# Patient Record
Sex: Female | Born: 1955 | Race: White | Hispanic: No | Smoking: Never smoker
Health system: Southern US, Community
[De-identification: ages and names within clinical notes are randomized; demographics above are authoritative.]

## PROBLEM LIST (undated history)

## (undated) DIAGNOSIS — K5790 Diverticulosis of intestine, part unspecified, without perforation or abscess without bleeding: Secondary | ICD-10-CM

## (undated) DIAGNOSIS — E854 Organ-limited amyloidosis: Secondary | ICD-10-CM

## (undated) DIAGNOSIS — E119 Type 2 diabetes mellitus without complications: Secondary | ICD-10-CM

## (undated) DIAGNOSIS — I10 Essential (primary) hypertension: Secondary | ICD-10-CM

## (undated) DIAGNOSIS — E8581 Light chain (AL) amyloidosis: Secondary | ICD-10-CM

## (undated) DIAGNOSIS — I7300 Raynaud's syndrome without gangrene: Secondary | ICD-10-CM

## (undated) DIAGNOSIS — E0590 Thyrotoxicosis, unspecified without thyrotoxic crisis or storm: Secondary | ICD-10-CM

## (undated) DIAGNOSIS — G629 Polyneuropathy, unspecified: Secondary | ICD-10-CM

## (undated) DIAGNOSIS — R6889 Other general symptoms and signs: Secondary | ICD-10-CM

## (undated) HISTORY — DX: Diverticulosis of intestine, part unspecified, without perforation or abscess without bleeding: K57.90

## (undated) HISTORY — PX: BACK SURGERY: SHX140

## (undated) HISTORY — PX: MANDIBLE SURGERY: SHX707

## (undated) HISTORY — DX: Polyneuropathy, unspecified: G62.9

## (undated) HISTORY — DX: Other general symptoms and signs: R68.89

## (undated) HISTORY — DX: Light chain (AL) amyloidosis: E85.81

## (undated) HISTORY — PX: BREAST SURGERY: SHX581

## (undated) HISTORY — PX: OTHER SURGICAL HISTORY: SHX169

## (undated) HISTORY — DX: Raynaud's syndrome without gangrene: I73.00

## (undated) HISTORY — DX: Thyrotoxicosis, unspecified without thyrotoxic crisis or storm: E05.90

---

## 2004-04-28 ENCOUNTER — Other Ambulatory Visit: Admission: RE | Admit: 2004-04-28 | Discharge: 2004-04-28 | Payer: Self-pay | Admitting: Obstetrics and Gynecology

## 2005-05-22 ENCOUNTER — Ambulatory Visit: Payer: Self-pay | Admitting: Cardiology

## 2005-06-25 ENCOUNTER — Ambulatory Visit: Payer: Self-pay | Admitting: Cardiology

## 2017-02-11 DIAGNOSIS — E1165 Type 2 diabetes mellitus with hyperglycemia: Secondary | ICD-10-CM | POA: Insufficient documentation

## 2021-05-01 ENCOUNTER — Encounter (HOSPITAL_COMMUNITY): Payer: Self-pay | Admitting: Emergency Medicine

## 2021-05-01 ENCOUNTER — Emergency Department (HOSPITAL_COMMUNITY): Payer: Medicare HMO

## 2021-05-01 ENCOUNTER — Other Ambulatory Visit: Payer: Self-pay

## 2021-05-01 ENCOUNTER — Inpatient Hospital Stay (HOSPITAL_COMMUNITY)
Admission: EM | Admit: 2021-05-01 | Discharge: 2021-05-12 | DRG: 074 | Disposition: A | Discharge status: Skilled Nursing Facility | Payer: Medicare HMO | Attending: Internal Medicine | Admitting: Internal Medicine

## 2021-05-01 DIAGNOSIS — E8581 Light chain (AL) amyloidosis: Secondary | ICD-10-CM | POA: Diagnosis present

## 2021-05-01 DIAGNOSIS — R0902 Hypoxemia: Secondary | ICD-10-CM

## 2021-05-01 DIAGNOSIS — N390 Urinary tract infection, site not specified: Secondary | ICD-10-CM | POA: Diagnosis not present

## 2021-05-01 DIAGNOSIS — R55 Syncope and collapse: Secondary | ICD-10-CM

## 2021-05-01 DIAGNOSIS — E119 Type 2 diabetes mellitus without complications: Secondary | ICD-10-CM

## 2021-05-01 DIAGNOSIS — E854 Organ-limited amyloidosis: Secondary | ICD-10-CM | POA: Diagnosis present

## 2021-05-01 DIAGNOSIS — G43109 Migraine with aura, not intractable, without status migrainosus: Secondary | ICD-10-CM | POA: Diagnosis present

## 2021-05-01 DIAGNOSIS — R29898 Other symptoms and signs involving the musculoskeletal system: Secondary | ICD-10-CM

## 2021-05-01 DIAGNOSIS — Z833 Family history of diabetes mellitus: Secondary | ICD-10-CM

## 2021-05-01 DIAGNOSIS — J9811 Atelectasis: Secondary | ICD-10-CM | POA: Diagnosis present

## 2021-05-01 DIAGNOSIS — E669 Obesity, unspecified: Secondary | ICD-10-CM | POA: Diagnosis present

## 2021-05-01 DIAGNOSIS — Z91041 Radiographic dye allergy status: Secondary | ICD-10-CM

## 2021-05-01 DIAGNOSIS — Z20822 Contact with and (suspected) exposure to covid-19: Secondary | ICD-10-CM | POA: Diagnosis present

## 2021-05-01 DIAGNOSIS — E1149 Type 2 diabetes mellitus with other diabetic neurological complication: Principal | ICD-10-CM | POA: Diagnosis present

## 2021-05-01 DIAGNOSIS — D696 Thrombocytopenia, unspecified: Secondary | ICD-10-CM | POA: Diagnosis not present

## 2021-05-01 DIAGNOSIS — R440 Auditory hallucinations: Secondary | ICD-10-CM | POA: Diagnosis present

## 2021-05-01 DIAGNOSIS — R270 Ataxia, unspecified: Secondary | ICD-10-CM | POA: Diagnosis not present

## 2021-05-01 DIAGNOSIS — Z6836 Body mass index (BMI) 36.0-36.9, adult: Secondary | ICD-10-CM

## 2021-05-01 DIAGNOSIS — Z8249 Family history of ischemic heart disease and other diseases of the circulatory system: Secondary | ICD-10-CM

## 2021-05-01 DIAGNOSIS — E039 Hypothyroidism, unspecified: Secondary | ICD-10-CM | POA: Diagnosis present

## 2021-05-01 DIAGNOSIS — I10 Essential (primary) hypertension: Secondary | ICD-10-CM | POA: Diagnosis present

## 2021-05-01 DIAGNOSIS — I959 Hypotension, unspecified: Secondary | ICD-10-CM | POA: Diagnosis not present

## 2021-05-01 DIAGNOSIS — F419 Anxiety disorder, unspecified: Secondary | ICD-10-CM | POA: Diagnosis present

## 2021-05-01 DIAGNOSIS — F32A Depression, unspecified: Secondary | ICD-10-CM | POA: Diagnosis present

## 2021-05-01 DIAGNOSIS — F600 Paranoid personality disorder: Secondary | ICD-10-CM

## 2021-05-01 DIAGNOSIS — R531 Weakness: Secondary | ICD-10-CM | POA: Diagnosis present

## 2021-05-01 DIAGNOSIS — K625 Hemorrhage of anus and rectum: Secondary | ICD-10-CM | POA: Diagnosis not present

## 2021-05-01 DIAGNOSIS — I639 Cerebral infarction, unspecified: Secondary | ICD-10-CM | POA: Diagnosis present

## 2021-05-01 DIAGNOSIS — D72829 Elevated white blood cell count, unspecified: Secondary | ICD-10-CM

## 2021-05-01 HISTORY — DX: Organ-limited amyloidosis: E85.4

## 2021-05-01 HISTORY — DX: Type 2 diabetes mellitus without complications: E11.9

## 2021-05-01 HISTORY — DX: Essential (primary) hypertension: I10

## 2021-05-01 LAB — CBC WITH DIFFERENTIAL/PLATELET
Abs Immature Granulocytes: 0.04 10*3/uL (ref 0.00–0.07)
Basophils Absolute: 0 10*3/uL (ref 0.0–0.1)
Basophils Relative: 1 %
Eosinophils Absolute: 0.1 10*3/uL (ref 0.0–0.5)
Eosinophils Relative: 1 %
HCT: 46 % (ref 36.0–46.0)
Hemoglobin: 16.6 g/dL — ABNORMAL HIGH (ref 12.0–15.0)
Immature Granulocytes: 1 %
Lymphocytes Relative: 21 %
Lymphs Abs: 1.8 10*3/uL (ref 0.7–4.0)
MCH: 31.9 pg (ref 26.0–34.0)
MCHC: 36.1 g/dL — ABNORMAL HIGH (ref 30.0–36.0)
MCV: 88.3 fL (ref 80.0–100.0)
Monocytes Absolute: 0.6 10*3/uL (ref 0.1–1.0)
Monocytes Relative: 7 %
Neutro Abs: 5.8 10*3/uL (ref 1.7–7.7)
Neutrophils Relative %: 69 %
Platelets: 150 10*3/uL (ref 150–400)
RBC: 5.21 MIL/uL — ABNORMAL HIGH (ref 3.87–5.11)
RDW: 12.9 % (ref 11.5–15.5)
WBC: 8.4 10*3/uL (ref 4.0–10.5)
nRBC: 0 % (ref 0.0–0.2)

## 2021-05-01 LAB — COMPREHENSIVE METABOLIC PANEL
ALT: 28 U/L (ref 0–44)
AST: 34 U/L (ref 15–41)
Albumin: 4.2 g/dL (ref 3.5–5.0)
Alkaline Phosphatase: 84 U/L (ref 38–126)
Anion gap: 9 (ref 5–15)
BUN: 15 mg/dL (ref 8–23)
CO2: 28 mmol/L (ref 22–32)
Calcium: 8.8 mg/dL — ABNORMAL LOW (ref 8.9–10.3)
Chloride: 102 mmol/L (ref 98–111)
Creatinine, Ser: 0.63 mg/dL (ref 0.44–1.00)
GFR, Estimated: >60 mL/min (ref >60)
Glucose, Bld: 206 mg/dL — ABNORMAL HIGH (ref 70–99)
Potassium: 4 mmol/L (ref 3.5–5.1)
Sodium: 139 mmol/L (ref 135–145)
Total Bilirubin: 0.8 mg/dL (ref 0.3–1.2)
Total Protein: 7.9 g/dL (ref 6.5–8.1)

## 2021-05-01 IMAGING — CT CT HEAD W/O CM
3 series · 16 of 47 positions shown, 19 images · non-contrast
Comparison: None.

CLINICAL DATA: Headache.  Auditory hallucinations.

EXAM:
CT HEAD WITHOUT CONTRAST
TECHNIQUE: Contiguous axial images were obtained from the base of the skull
through the vertex without intravenous contrast.

[Series 2: head w o · axial · 0.41mm/px · z∈[-8,+122]mm · 10 of 32 slices shown, 13 images]
[im 3/32  brain]
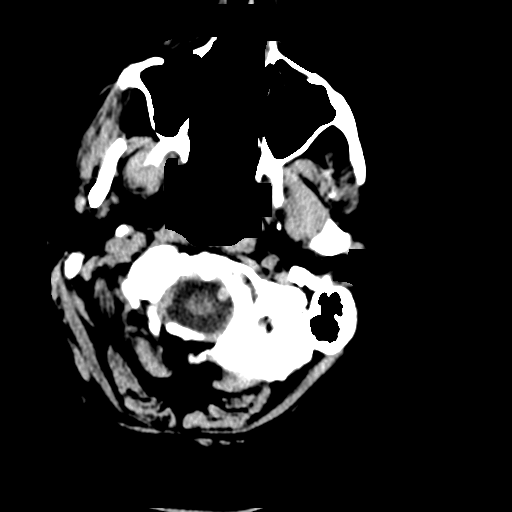
[im 3/32  bone]
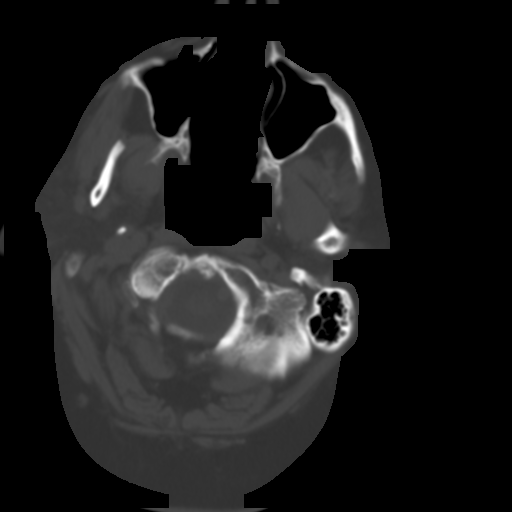
[im 6/32  brain]
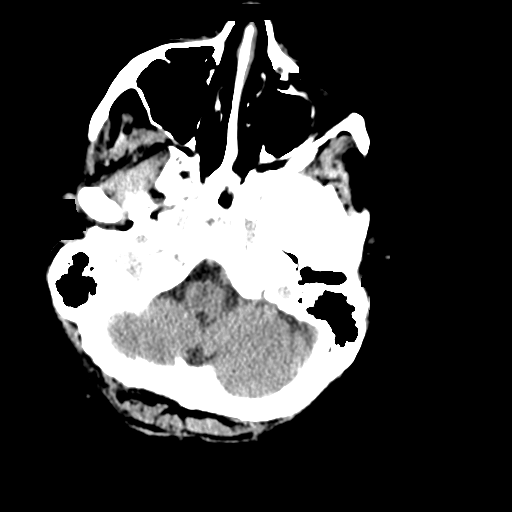
[im 9/32  brain]
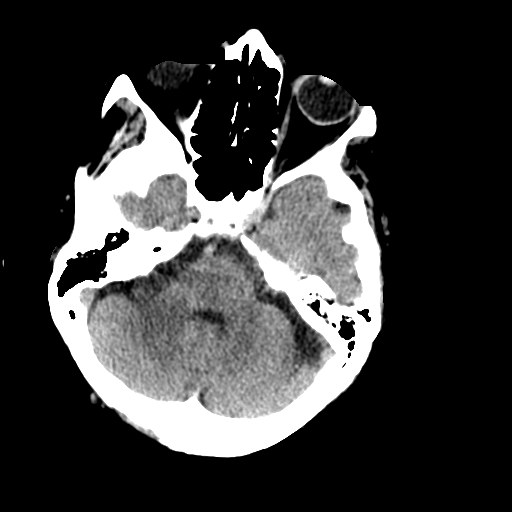
[im 11/32  brain]
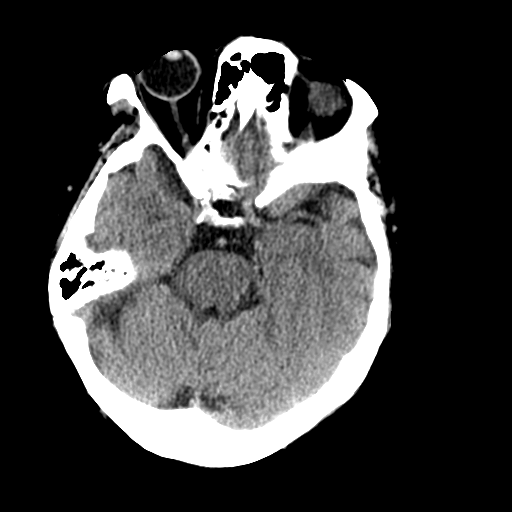
[im 14/32  brain]
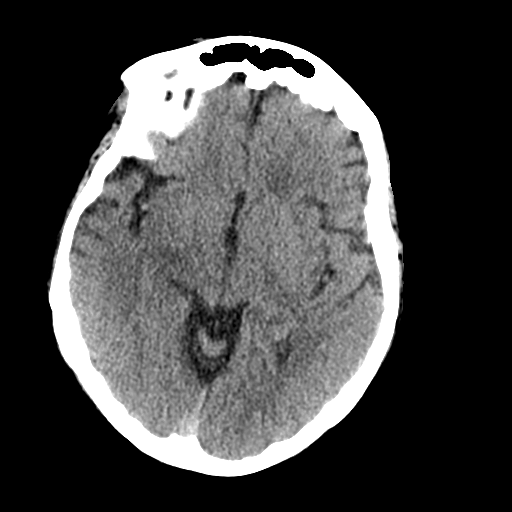
[im 14/32  bone]
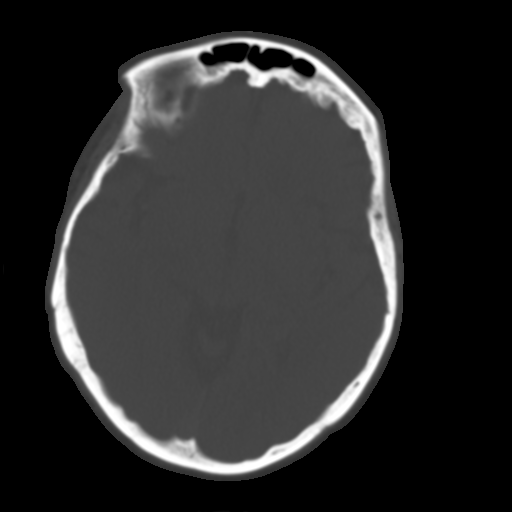
[im 18/32  brain]
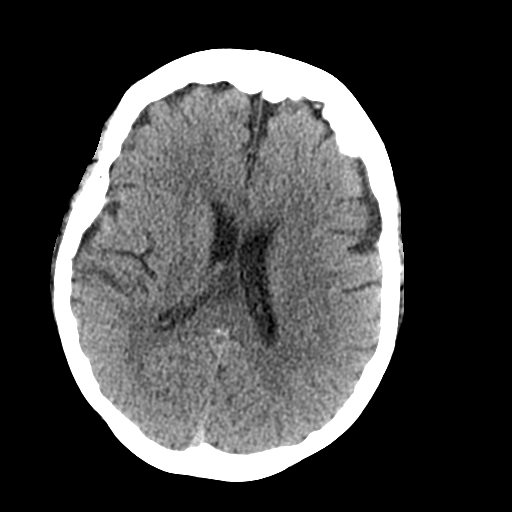
[im 21/32  brain]
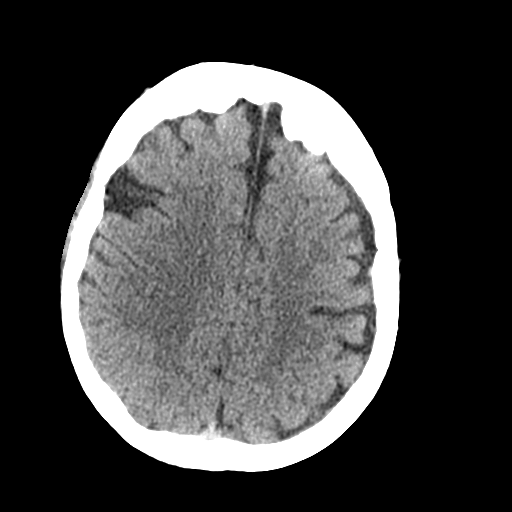
[im 24/32  brain]
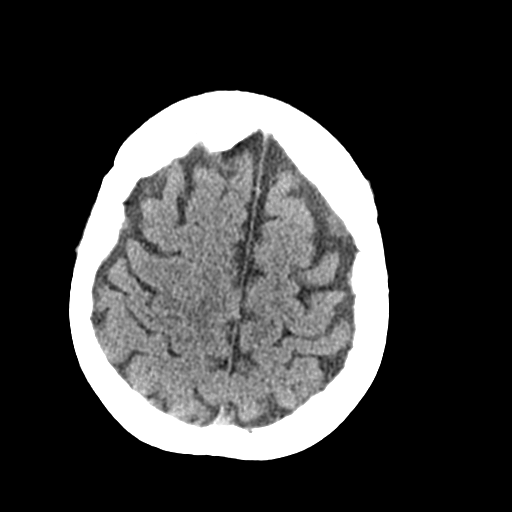
[im 26/32  brain]
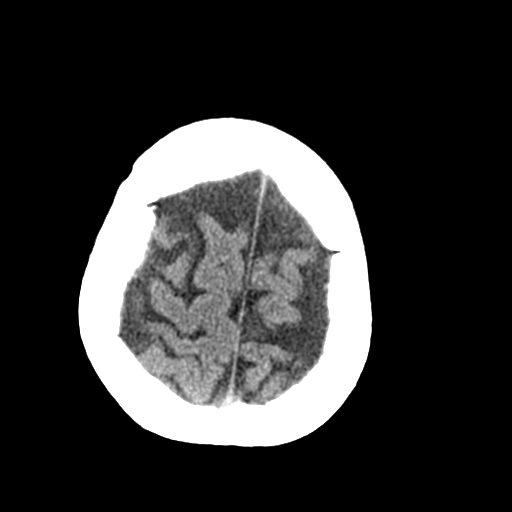
[im 26/32  bone]
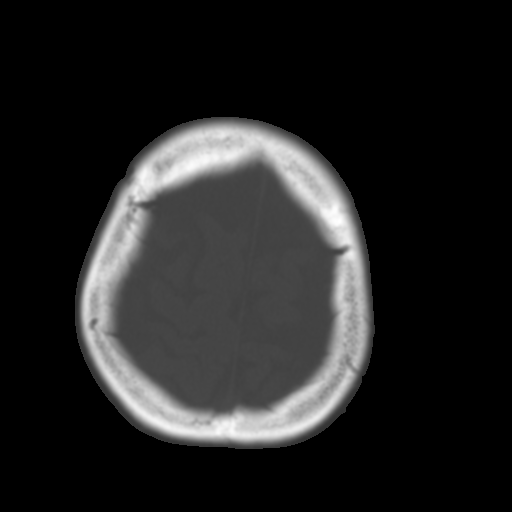
[im 29/32  brain]
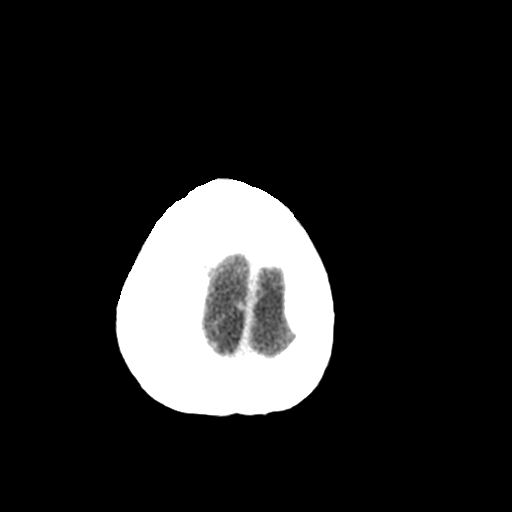

[Series 4: coronal soft · coronal · 0.33mm/px · 3 of 67 slices shown]
[im 23/67  brain]
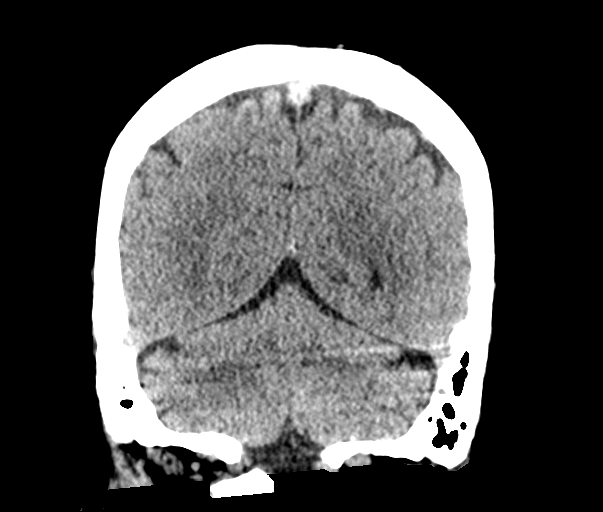
[im 30/67  brain]
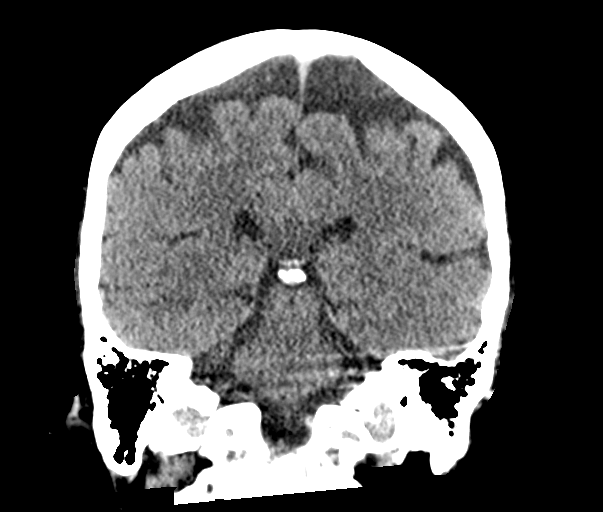
[im 37/67  brain]
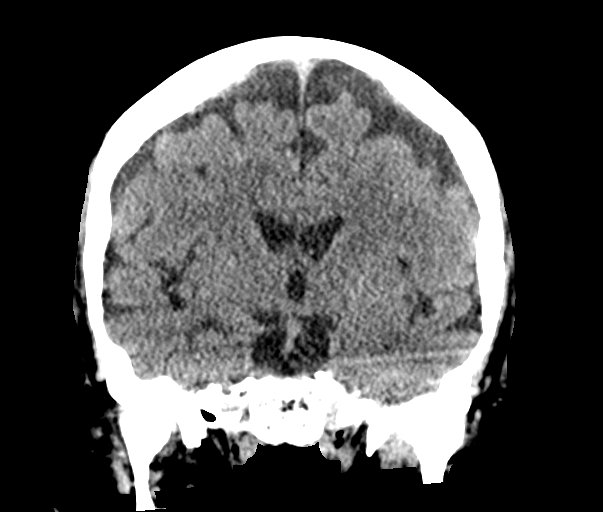

[Series 5: sagittal soft · sagittal · 0.34mm/px · 3 of 57 slices shown]
[im 20/57  brain]
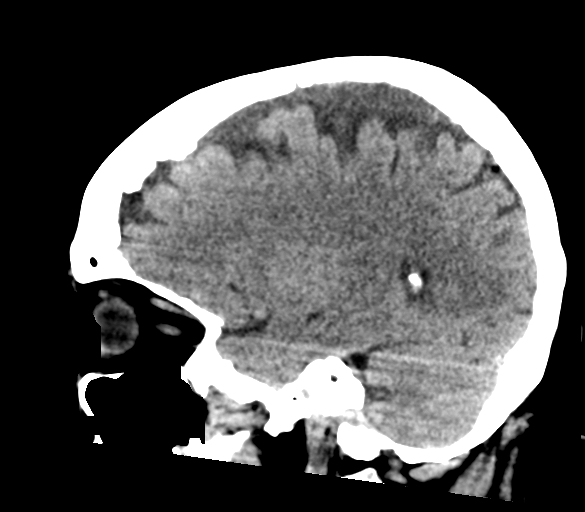
[im 29/57  brain]
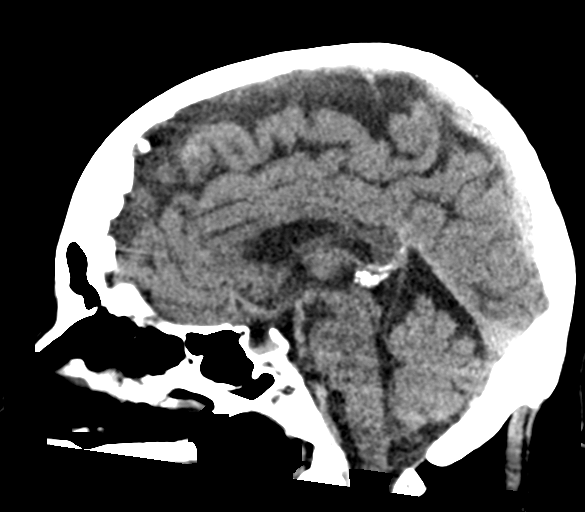
[im 38/57  brain]
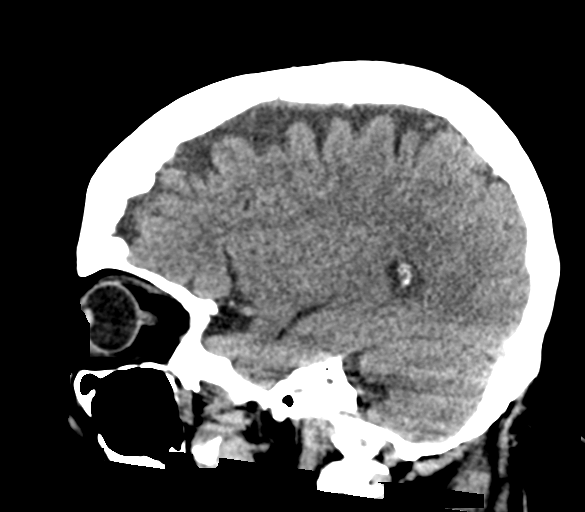

[16 of 47 positions shown; findings below may reference images not displayed]

FINDINGS: Brain: No evidence of acute infarction, hemorrhage, hydrocephalus,
extra-axial collection or mass lesion/mass effect.

Vascular: Negative for hyperdense vessel

Skull: Negative

Sinuses/Orbits: Negative

Other: None
IMPRESSION: Negative CT head

## 2021-05-01 NOTE — ED Triage Notes (Signed)
Cbg 191 WITH ems.

## 2021-05-01 NOTE — ED Notes (Signed)
Ambulated with patient to the restroom, unsteady on feet, states she does not normally need so much assistance with this task

## 2021-05-01 NOTE — ED Provider Notes (Signed)
[facility] EMERGENCY DEPARTMENT Provider Note   CSN: [id number] Arrival date & time: 05/01/21  1613     History Chief Complaint  Patient presents with   Hallucinations    Auditory hallucination started after halloween.  "Felt sound-wave in head about one hour away.  Pt thinks neighbors are watching her with cameras in her house.      Sheryl Henry is a 65 y.o. female.  HPI  This patient is a very pleasant 65 year old female presenting from home stating that she has been having some feeling of a very loud ringing in her ears, she states it sounds like there is a sound wave going through her head, a high-pitched squeal which is associated with a headache and near syncope.  She reports that this is happened several times in the last couple of days and today while she was on her porch she felt she was in a pass out hence she called paramedics for transport.  When I asked the patient if she is having any hallucinations she says no, she says I feel like my landlord's and neighbors are nosy but nobody is watching me and she does not have any complaints of paranoia.  The patient denies any fevers or chills though she has been fatigued and sleepy.  She has an ongoing headache.  Past Medical History:  Diagnosis Date   Diabetes mellitus without complication (HCC)    Hypertension     There are no problems to display for this patient.   Past Surgical History:  Procedure Laterality Date   BACK SURGERY     BREAST SURGERY     MANDIBLE SURGERY       OB History   No obstetric history on file.     History reviewed. No pertinent family history.  Social History   Tobacco Use   Smoking status: Never   Smokeless tobacco: Never  Vaping Use   Vaping Use: Never used  Substance Use Topics   Alcohol use: Not Currently   Drug use: Never    Home Medications Prior to Admission medications   Not on File    Allergies    Patient has no allergy information on record.  Review of  Systems   Review of Systems  All other systems reviewed and are negative.  Physical Exam Updated Vital Signs BP (!) 161/86   Pulse 79   Temp 98 F (36.7 C) (Oral)   Resp 18   Ht 1.702 m (5\' 7" )   Wt 81.6 kg   SpO2 97%   BMI 28.19 kg/m   Physical Exam Vitals and nursing note reviewed.  Constitutional:      General: She is not in acute distress.    Appearance: She is well-developed.  HENT:     Head: Normocephalic and atraumatic.     Right Ear: Tympanic membrane, ear canal and external ear normal. There is no impacted cerumen.     Left Ear: Tympanic membrane, ear canal and external ear normal. There is no impacted cerumen.     Nose: Nose normal. No congestion or rhinorrhea.     Mouth/Throat:     Mouth: Mucous membranes are moist.     Pharynx: Oropharynx is clear. No oropharyngeal exudate.  Eyes:     General: No scleral icterus.       Right eye: No discharge.        Left eye: No discharge.     Conjunctiva/sclera: Conjunctivae normal.     Pupils: Pupils are  equal, round, and reactive to light.  Neck:     Thyroid: No thyromegaly.     Vascular: No JVD.  Cardiovascular:     Rate and Rhythm: Normal rate and regular rhythm.     Heart sounds: Normal heart sounds. No murmur heard.   No friction rub. No gallop.  Pulmonary:     Effort: Pulmonary effort is normal. No respiratory distress.     Breath sounds: Normal breath sounds. No wheezing or rales.  Abdominal:     General: Bowel sounds are normal. There is no distension.     Palpations: Abdomen is soft. There is no mass.     Tenderness: There is no abdominal tenderness.  Musculoskeletal:        General: No tenderness. Normal range of motion.     Cervical back: Normal range of motion and neck supple.     Right lower leg: No edema.     Left lower leg: No edema.  Lymphadenopathy:     Cervical: No cervical adenopathy.  Skin:    General: Skin is warm and dry.     Findings: No erythema or rash.  Neurological:     Mental  Status: She is alert.     Coordination: Coordination normal.     Comments: Speech is clear, cranial nerves III through XII are intact, memory is intact, strength is normal in all 4 extremities including grips, sensation is intact to light touch and pinprick in all 4 extremities. Coordination as tested by finger-nose-finger is normal, no limb ataxia. Normal gait, normal reflexes at the patellar tendons bilaterally  Psychiatric:        Behavior: Behavior normal.    ED Results / Procedures / Treatments   Labs (all labs ordered are listed, but only abnormal results are displayed) Labs Reviewed  COMPREHENSIVE METABOLIC PANEL - Abnormal; Notable for the following components:      Result Value   Glucose, Bld 206 (*)    Calcium 8.8 (*)    All other components within normal limits  CBC WITH DIFFERENTIAL/PLATELET - Abnormal; Notable for the following components:   RBC 5.21 (*)    Hemoglobin 16.6 (*)    MCHC 36.1 (*)    All other components within normal limits  RESP PANEL BY RT-PCR (FLU A&B, COVID) ARPGX2  URINALYSIS, ROUTINE W REFLEX MICROSCOPIC  MAGNESIUM    EKG EKG Interpretation  Date/Time:  Thursday May 01 2021 17:07:10 EST Ventricular Rate:  81 PR Interval:  171 QRS Duration: 108 QT Interval:  394 QTC Calculation: 458 R Axis:   32 Text Interpretation: Sinus rhythm Nonspecific T abnormalities, lateral leads Confirmed by Eber Hong ([id number]) on 05/01/2021 9:38:18 PM  Radiology CT HEAD WO CONTRAST ( )  Result Date: 05/01/2021 CLINICAL DATA:  Headache.  Auditory hallucinations. EXAM: CT HEAD WITHOUT CONTRAST TECHNIQUE: Contiguous axial images were obtained from the base of the skull through the vertex without intravenous contrast. COMPARISON:  None. FINDINGS: Brain: No evidence of acute infarction, hemorrhage, hydrocephalus, extra-axial collection or mass lesion/mass effect. Vascular: Negative for hyperdense vessel Skull: Negative Sinuses/Orbits: Negative Other: None  IMPRESSION: Negative CT head Electronically Signed   By: Marlan Palau M.D.   On: 05/01/2021 17:46    Procedures Procedures   Medications Ordered in ED Medications - No data to display  ED Course  I have reviewed the triage vital signs and the nursing notes.  Pertinent labs & imaging results that were available during my care of the patient were reviewed by me and  considered in my medical decision making (see chart for details).    MDM Rules/Calculators/A&P                           This patient's exam is totally unremarkable, there is no signs of neurologic dysfunction, there is no signs of cardiac dysfunction, her vital signs reflect minimal hypertension but she has not febrile tachycardic or hypoxic.  Her lung exam is normal and neurologically she is unremarkable.  She has had some increasing headaches and now with near syncope, I do not get a feeling that there is a psychiatric undertones to this at all.  We will check labs and a CT scan  CT scan does not show any acute findings, laboratory work-up was rather unremarkable, added urinalysis and a magnesium level.  I tried to get this patient up to walk and she is completely ataxic and not able to walk at all.  Will admit the patient to the hospital, needs rest of the stroke work-up in house.  Likely needs an MRI in the morning.  Final Clinical Impression(s) / ED Diagnoses Final diagnoses:  Ataxia    Rx / DC Orders ED Discharge Orders     None        Eber Hong, MD 05/01/21 2354

## 2021-05-02 ENCOUNTER — Observation Stay (HOSPITAL_COMMUNITY): Payer: Medicare HMO

## 2021-05-02 ENCOUNTER — Encounter (HOSPITAL_COMMUNITY): Payer: Self-pay | Admitting: Family Medicine

## 2021-05-02 ENCOUNTER — Observation Stay (HOSPITAL_BASED_OUTPATIENT_CLINIC_OR_DEPARTMENT_OTHER): Payer: Medicare HMO

## 2021-05-02 DIAGNOSIS — R55 Syncope and collapse: Secondary | ICD-10-CM | POA: Diagnosis not present

## 2021-05-02 DIAGNOSIS — Z794 Long term (current) use of insulin: Secondary | ICD-10-CM | POA: Diagnosis not present

## 2021-05-02 DIAGNOSIS — I10 Essential (primary) hypertension: Secondary | ICD-10-CM

## 2021-05-02 DIAGNOSIS — I639 Cerebral infarction, unspecified: Secondary | ICD-10-CM | POA: Diagnosis not present

## 2021-05-02 DIAGNOSIS — R270 Ataxia, unspecified: Secondary | ICD-10-CM

## 2021-05-02 DIAGNOSIS — G43109 Migraine with aura, not intractable, without status migrainosus: Secondary | ICD-10-CM | POA: Diagnosis present

## 2021-05-02 DIAGNOSIS — R29898 Other symptoms and signs involving the musculoskeletal system: Secondary | ICD-10-CM | POA: Diagnosis present

## 2021-05-02 DIAGNOSIS — L99 Other disorders of skin and subcutaneous tissue in diseases classified elsewhere: Secondary | ICD-10-CM | POA: Diagnosis present

## 2021-05-02 DIAGNOSIS — E1165 Type 2 diabetes mellitus with hyperglycemia: Secondary | ICD-10-CM | POA: Diagnosis not present

## 2021-05-02 DIAGNOSIS — R519 Headache, unspecified: Secondary | ICD-10-CM | POA: Diagnosis not present

## 2021-05-02 DIAGNOSIS — E119 Type 2 diabetes mellitus without complications: Secondary | ICD-10-CM

## 2021-05-02 DIAGNOSIS — E854 Organ-limited amyloidosis: Secondary | ICD-10-CM | POA: Diagnosis not present

## 2021-05-02 LAB — CSF CELL COUNT WITH DIFFERENTIAL
RBC Count, CSF: 26 /mm3 — ABNORMAL HIGH
Tube #: 4
WBC, CSF: 2 /mm3 (ref 0–5)

## 2021-05-02 LAB — LIPID PANEL
Cholesterol: 185 mg/dL (ref 0–200)
HDL: 49 mg/dL (ref >40)
LDL Cholesterol: 109 mg/dL — ABNORMAL HIGH (ref 0–99)
Total CHOL/HDL Ratio: 3.8 RATIO
Triglycerides: 137 mg/dL (ref <150)
VLDL: 27 mg/dL (ref 0–40)

## 2021-05-02 LAB — ECHOCARDIOGRAM COMPLETE
AR max vel: 2.7 cm2
AV Area VTI: 2.39 cm2
AV Area mean vel: 2.54 cm2
AV Mean grad: 2.6 mmHg
AV Peak grad: 4.3 mmHg
Ao pk vel: 1.04 m/s
Area-P 1/2: 7.51 cm2
Height: 67 in
S' Lateral: 2.6 cm
Single Plane A4C EF: 59.9 %
Weight: 3770.75 oz

## 2021-05-02 LAB — HEMOGLOBIN A1C
Hgb A1c MFr Bld: 7.3 % — ABNORMAL HIGH (ref 4.8–5.6)
Mean Plasma Glucose: 162.81 mg/dL

## 2021-05-02 LAB — RESP PANEL BY RT-PCR (FLU A&B, COVID) ARPGX2
Influenza A by PCR: NEGATIVE
Influenza B by PCR: NEGATIVE
SARS Coronavirus 2 by RT PCR: NEGATIVE

## 2021-05-02 LAB — URINALYSIS, ROUTINE W REFLEX MICROSCOPIC
Bilirubin Urine: NEGATIVE
Glucose, UA: NEGATIVE mg/dL
Hgb urine dipstick: NEGATIVE
Ketones, ur: NEGATIVE mg/dL
Leukocytes,Ua: NEGATIVE
Nitrite: NEGATIVE
Protein, ur: NEGATIVE mg/dL
Specific Gravity, Urine: 1.006 (ref 1.005–1.030)
pH: 6 (ref 5.0–8.0)

## 2021-05-02 LAB — PROTEIN AND GLUCOSE, CSF
Glucose, CSF: 120 mg/dL — ABNORMAL HIGH (ref 40–70)
Total  Protein, CSF: 54 mg/dL — ABNORMAL HIGH (ref 15–45)

## 2021-05-02 LAB — MAGNESIUM: Magnesium: 2.1 mg/dL (ref 1.7–2.4)

## 2021-05-02 LAB — GLUCOSE, CAPILLARY
Glucose-Capillary: 272 mg/dL — ABNORMAL HIGH (ref 70–99)
Glucose-Capillary: 274 mg/dL — ABNORMAL HIGH (ref 70–99)
Glucose-Capillary: 287 mg/dL — ABNORMAL HIGH (ref 70–99)

## 2021-05-02 LAB — HIV ANTIBODY (ROUTINE TESTING W REFLEX): HIV Screen 4th Generation wRfx: NONREACTIVE

## 2021-05-02 LAB — TSH: TSH: 3.221 u[IU]/mL (ref 0.350–4.500)

## 2021-05-02 IMAGING — MR MR THORACIC SPINE W/O CM
4 of 6 series · 25 of 48 positions shown · non-contrast
Comparison: None.

CLINICAL DATA: Ataxia, nontraumatic, T-spine pathology suspected;
Myelopathy, acute or progressive Low back pain, cauda equina
syndrome suspected

EXAM:
MRI THORACIC AND LUMBAR SPINE WITHOUT CONTRAST
TECHNIQUE: Multiplanar and multiecho pulse sequences of the thoracic and lumbar
spine were obtained without intravenous contrast.

[Series 18: T1 · sagittal · 3.3mm · 0.62mm/px · 4 of 13 slices shown (1 of 2)]
[im 1/13]
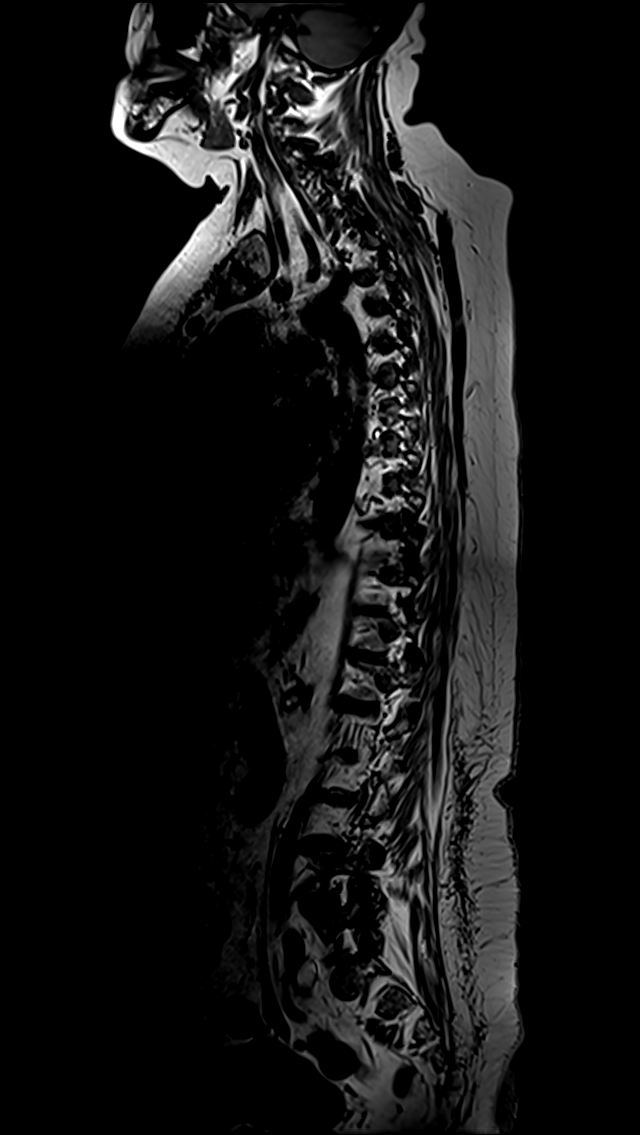
[im 5/13]
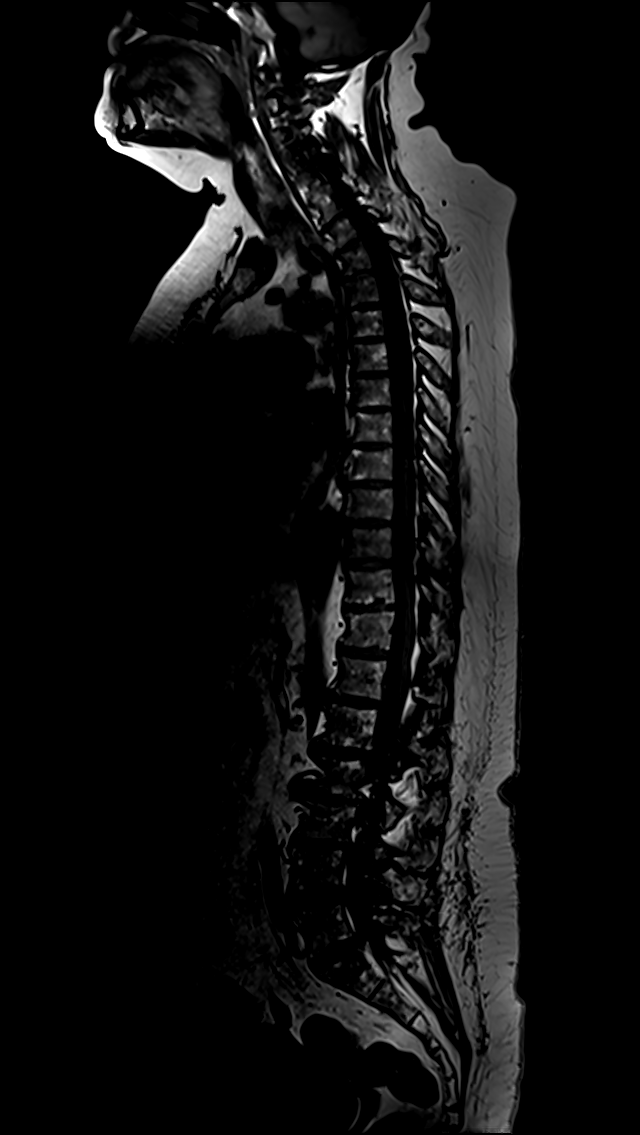
[im 9/13]
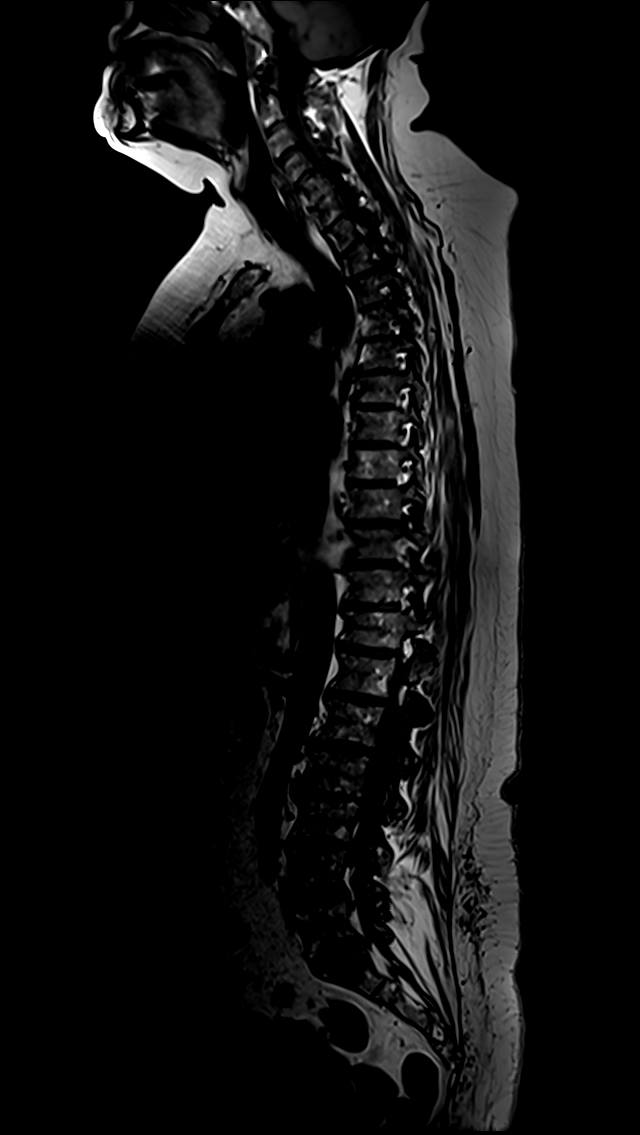
[im 13/13]
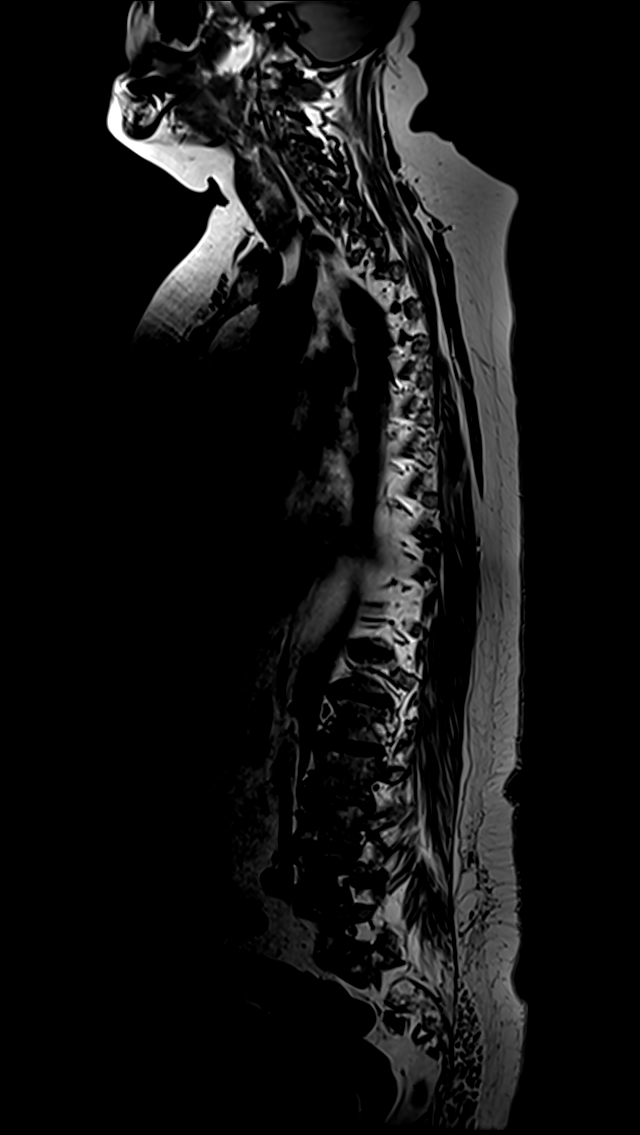

[Series 23: T2 · sagittal · 3.0mm · 1.06mm/px · 6 of 17 slices shown (1 of 2)]
[im 1/17]
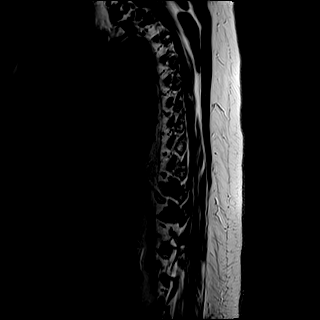
[im 4/17]
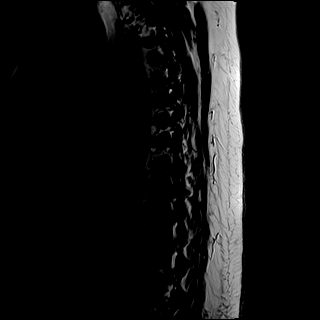
[im 7/17]
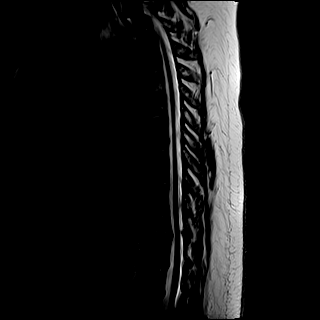
[im 10/17]
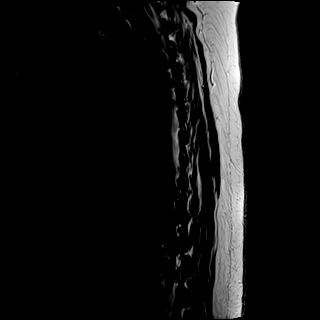
[im 13/17]
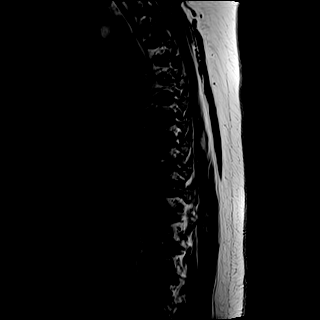
[im 17/17]
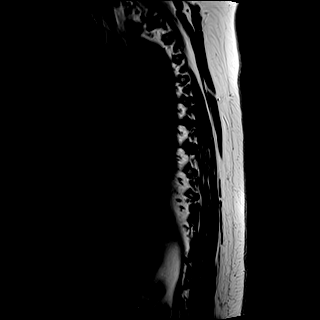

[Series 24: T1 · sagittal · 3.0mm · 1.33mm/px · 6 of 17 slices shown (2 of 2)]
[im 1/17]
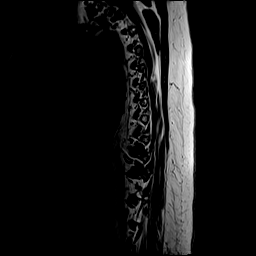
[im 4/17]
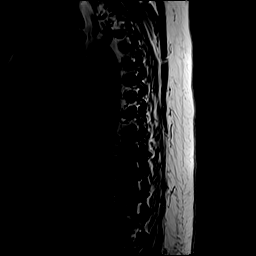
[im 7/17]
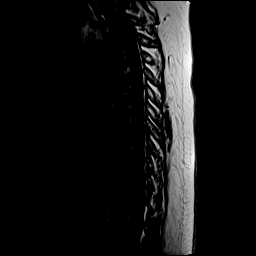
[im 10/17]
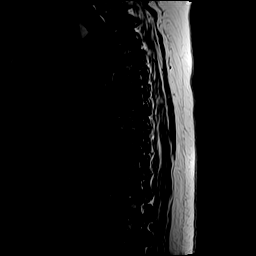
[im 13/17]
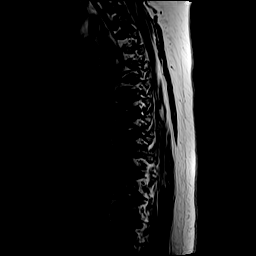
[im 17/17]
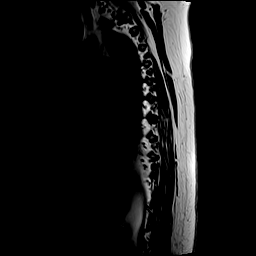

[Series 26: T2 · axial · 5.0mm · 0.74mm/px · z∈[-454,-220]mm · 9 of 39 slices shown (2 of 2)]
[im 1/39]
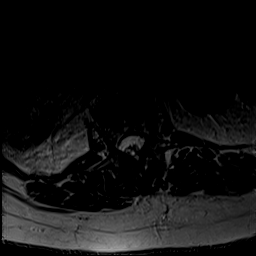
[im 7/39]
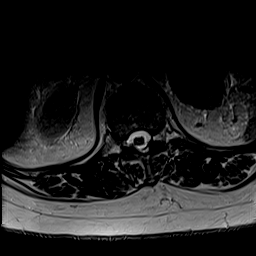
[im 13/39]
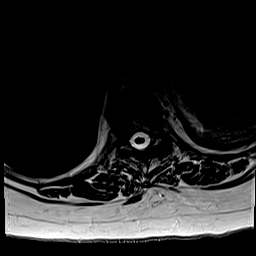
[im 16/39]
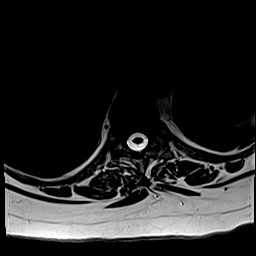
[im 20/39]
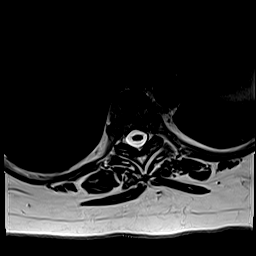
[im 23/39]
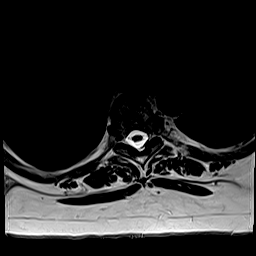
[im 26/39]
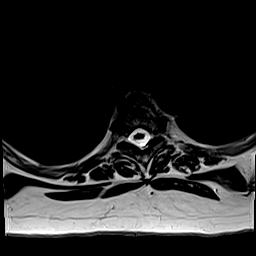
[im 32/39]
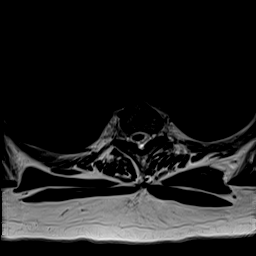
[im 39/39]
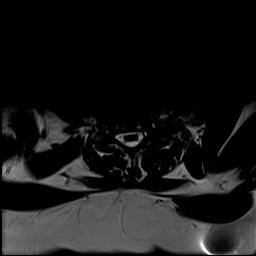

[25 of 48 positions shown; findings below may reference images not displayed]

FINDINGS: MRI THORACIC SPINE FINDINGS

Mildly motion limited study.

Alignment: No substantial sagittal subluxation. Mild kyphosis at
T10-T11.

Vertebrae: Vertebral body heights are maintained. No focal marrow
edema to suggest acute fracture or discitis/osteomyelitis.
Heterogeneous bone marrow without suspicious bone lesion.

Cord: Mildly motion limited evaluation without evidence of abnormal
cord signal or abnormal cord morphology.

Paraspinal and other soft tissues: Unremarkable.

Disc levels:

Small disc bulge at T10-T11 with ligamentum flavum thickening and
facet hypertrophy at this level. Resulting mild canal stenosis.
Otherwise, no significant canal stenosis.

Multilevel facet arthropathy moderate foraminal stenosis on the
right at T1-T2 and the left at T8-T9. Otherwise, multilevel
mild-to-moderate foraminal stenosis.

MRI LUMBAR SPINE FINDINGS

Segmentation:  Standard.

Alignment:  No substantial sagittal subluxation.

Vertebrae: Vertebral body heights are maintained.
Degenerative/discogenic endplate signal changes about the right
eccentric L2-L3 disc. Otherwise, no focal marrow edema to suggest
acute fracture discitis/osteomyelitis. Heterogeneous bone marrow
without suspicious bone lesion.

Conus medullaris and cauda equina: Conus extends to the T12-L1
level. Conus appears normal.

Paraspinal and other soft tissues: Unremarkable.

Disc levels:

T12-L1: Mild disc bulging without significant stenosis.

L1-L2: Mild disc bulging and bilateral facet arthropathy without
significant stenosis.

L2-L3: Left eccentric disc height loss. Left eccentric disc bulging
and endplate spurring. Bilateral facet arthropathy. Resulting mild
to moderate canal and left greater than right subarticular recess
stenosis. Mild-to-moderate left foraminal stenosis with left far
lateral disc closely approximating the exiting left L2 nerve.

L3-L4: Broad disc bulge with ligamentum flavum thickening and right
greater than left facet arthropathy. Resulting moderate canal
stenosis with moderate to severe right subarticular recess and
foraminal stenosis. Mild left foraminal stenosis.

L4-L5: Broad disc bulge with ligamentum flavum thickening and
bilateral facet arthropathy. Resulting mild to moderate canal and
bilateral subarticular recess stenosis. Mild bilateral foraminal
stenosis.

L5-S1: Disc bulging with bilateral facet arthropathy. No significant
canal or foraminal stenosis.
IMPRESSION: MR THORACIC SPINE IMPRESSION

1. Moderate foraminal stenosis on the right at T1-T2 and the left at
T8-T9. Otherwise, multilevel mild-to-moderate foraminal stenosis.
2. Mild canal stenosis at T10-T11.

MR LUMBAR SPINE IMPRESSION

1. At L3-L4, moderate to severe right subarticular recess and
foraminal stenosis with moderate canal stenosis.
2. At the L2-L3, mild-to-moderate canal and left greater than right
subarticular recess stenosis with mild-to-moderate left foraminal
stenosis and left far lateral disc closely approximating the exiting
left L2 nerve.
3. At L4-L5, mild-to-moderate canal and bilateral subarticular
recess stenosis with mild bilateral foraminal stenosis.

## 2021-05-02 IMAGING — MR MR LUMBAR SPINE W/O CM
4 of 5 series · 33 of 48 positions shown · non-contrast
Comparison: None.

CLINICAL DATA: Ataxia, nontraumatic, T-spine pathology suspected;
Myelopathy, acute or progressive Low back pain, cauda equina
syndrome suspected

EXAM:
MRI THORACIC AND LUMBAR SPINE WITHOUT CONTRAST
TECHNIQUE: Multiplanar and multiecho pulse sequences of the thoracic and lumbar
spine were obtained without intravenous contrast.

[Series 20: T2 · sagittal · 4.0mm · 0.81mm/px · 8 of 17 slices shown (1 of 2)]
[im 1/17]
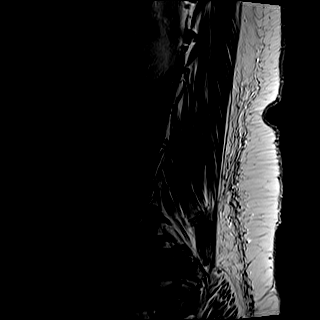
[im 3/17]
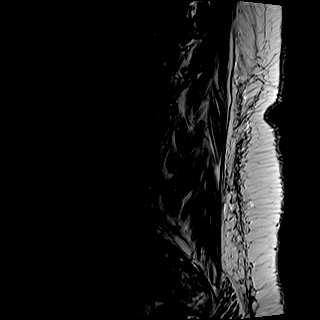
[im 5/17]
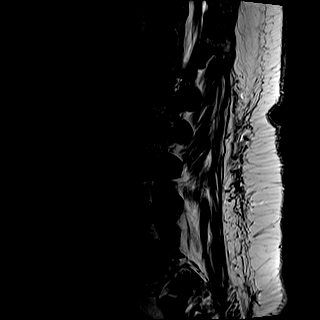
[im 7/17]
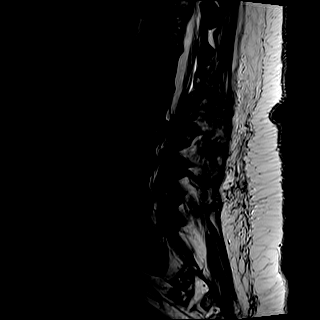
[im 10/17]
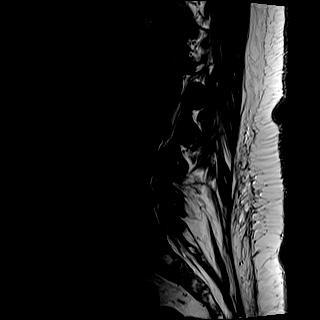
[im 12/17]
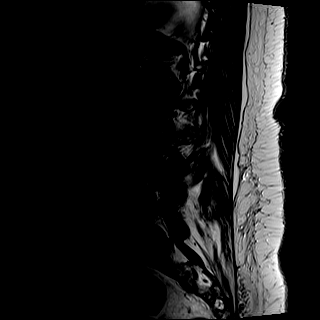
[im 14/17]
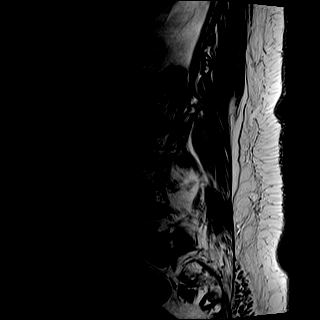
[im 17/17]
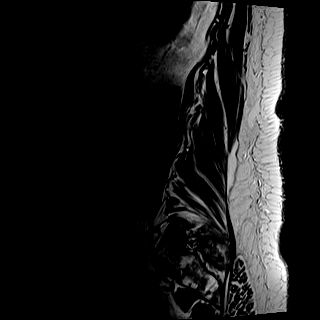

[Series 22: T1 · sagittal · 4.0mm · 1.02mm/px · 7 of 17 slices shown (1 of 2)]
[im 1/17]
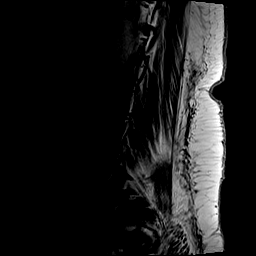
[im 3/17]
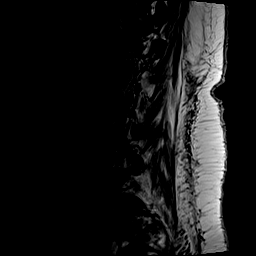
[im 6/17]
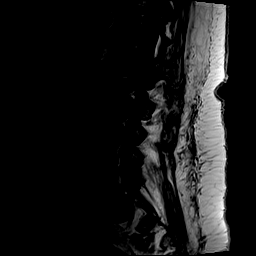
[im 9/17]
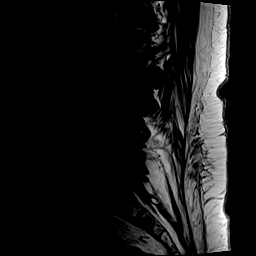
[im 11/17]
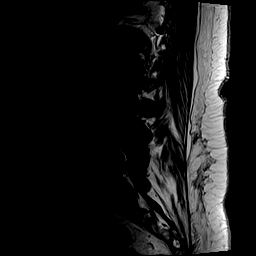
[im 14/17]
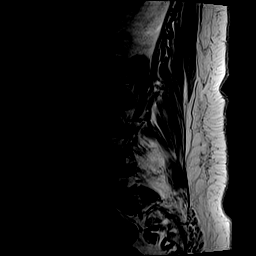
[im 17/17]
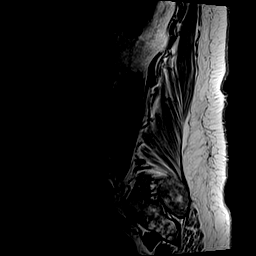

[Series 23: T2 · axial · 4.0mm · 0.70mm/px · z∈[-669,-480]mm · 9 of 31 slices shown (2 of 2)]
[im 1/31]
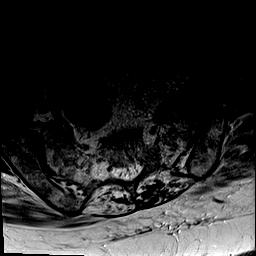
[im 6/31]
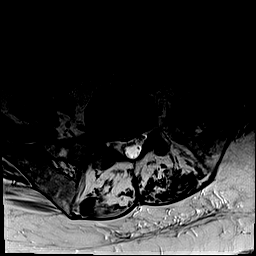
[im 11/31]
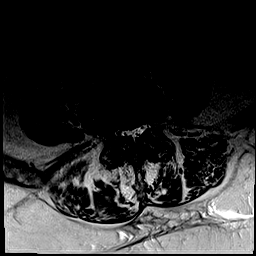
[im 13/31]
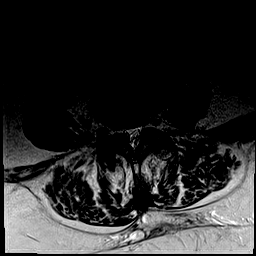
[im 16/31]
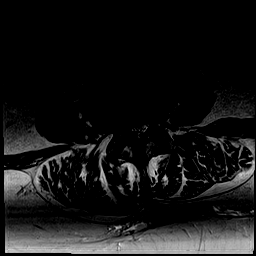
[im 18/31]
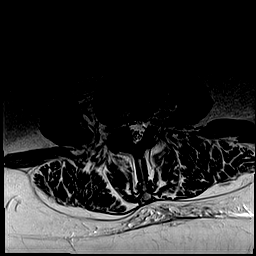
[im 21/31]
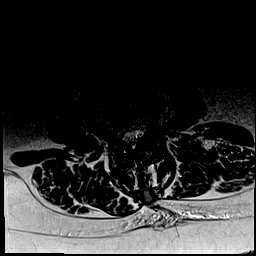
[im 26/31]
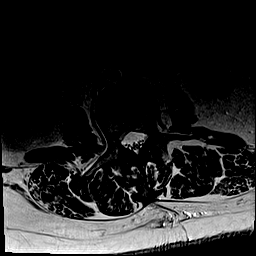
[im 31/31]
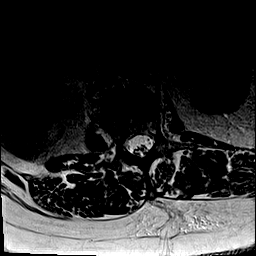

[Series 24: T1 · axial · 4.0mm · 0.35mm/px · z∈[-669,-480]mm · 9 of 31 slices shown (2 of 2)]
[im 1/31]
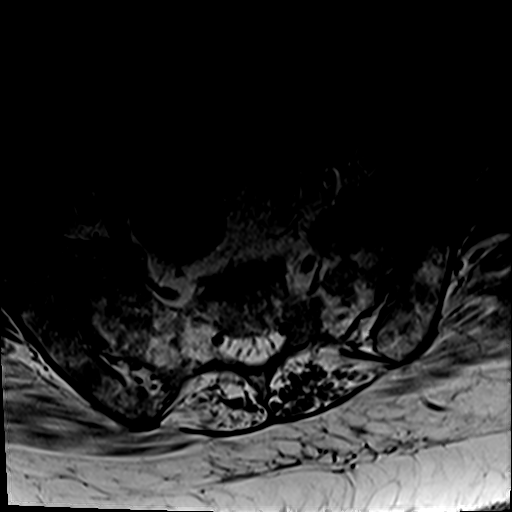
[im 6/31]
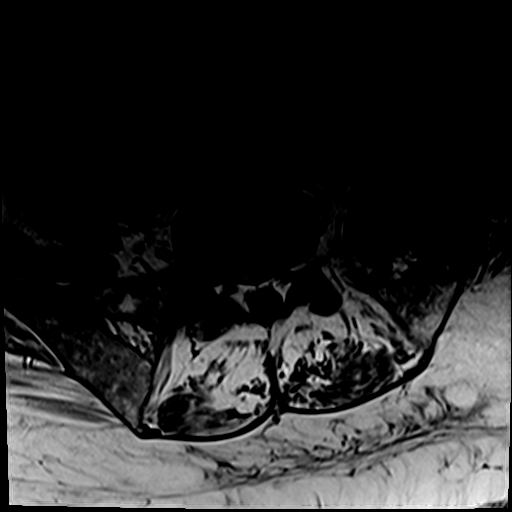
[im 11/31]
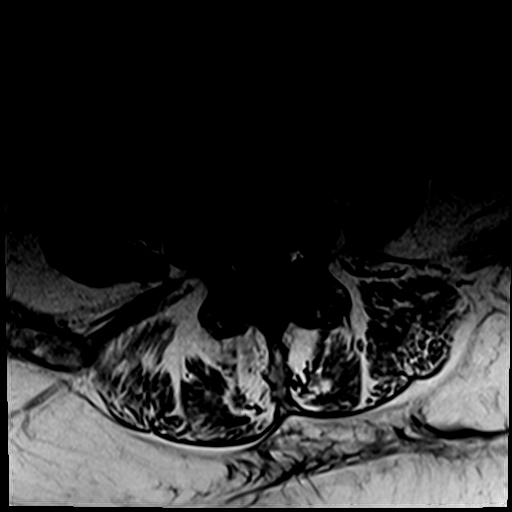
[im 13/31]
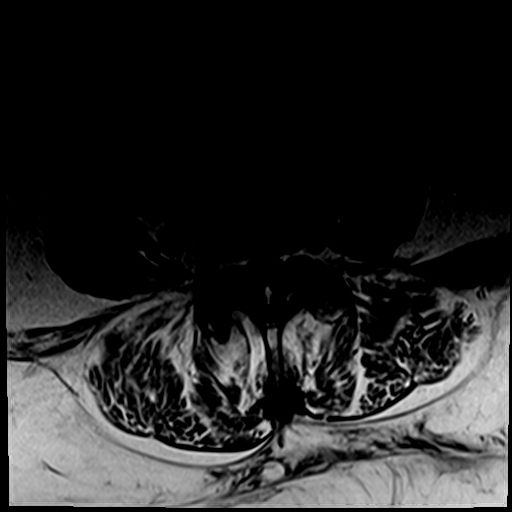
[im 16/31]
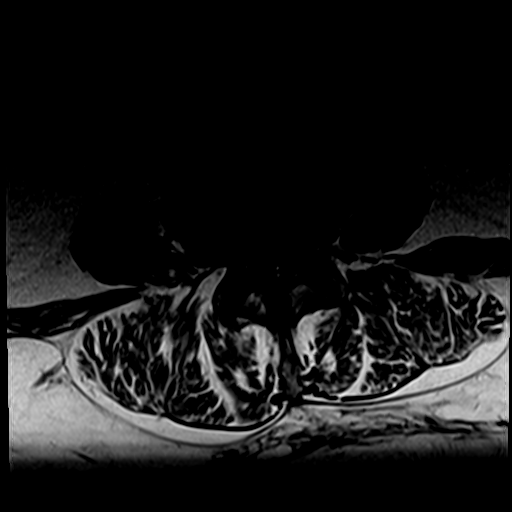
[im 18/31]
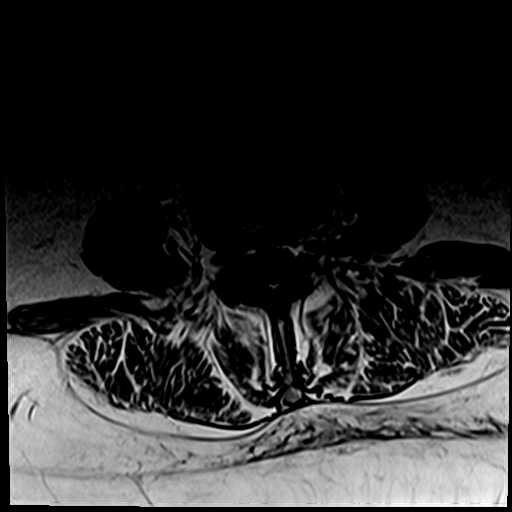
[im 21/31]
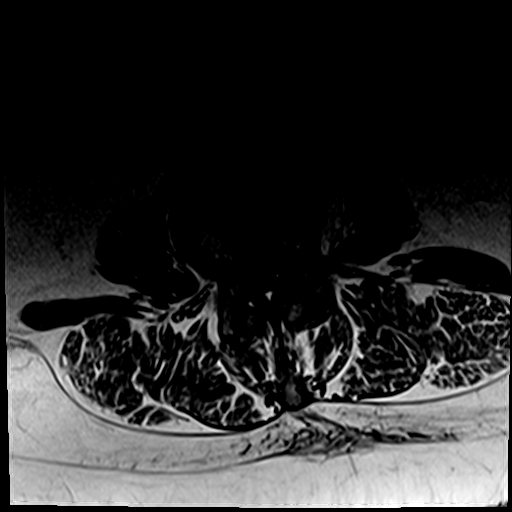
[im 26/31]
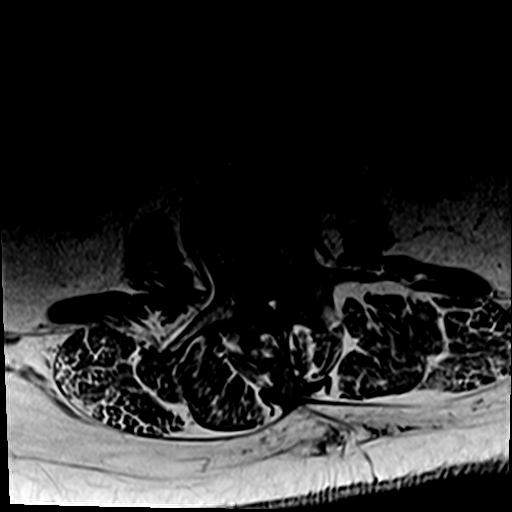
[im 31/31]
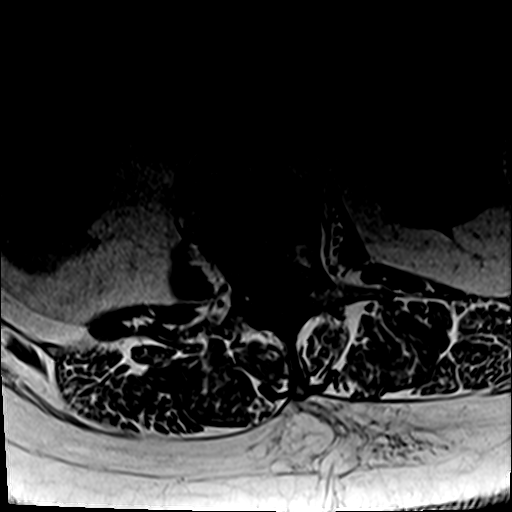

[33 of 48 positions shown; findings below may reference images not displayed]

FINDINGS: MRI THORACIC SPINE FINDINGS

Mildly motion limited study.

Alignment: No substantial sagittal subluxation. Mild kyphosis at
T10-T11.

Vertebrae: Vertebral body heights are maintained. No focal marrow
edema to suggest acute fracture or discitis/osteomyelitis.
Heterogeneous bone marrow without suspicious bone lesion.

Cord: Mildly motion limited evaluation without evidence of abnormal
cord signal or abnormal cord morphology.

Paraspinal and other soft tissues: Unremarkable.

Disc levels:

Small disc bulge at T10-T11 with ligamentum flavum thickening and
facet hypertrophy at this level. Resulting mild canal stenosis.
Otherwise, no significant canal stenosis.

Multilevel facet arthropathy moderate foraminal stenosis on the
right at T1-T2 and the left at T8-T9. Otherwise, multilevel
mild-to-moderate foraminal stenosis.

MRI LUMBAR SPINE FINDINGS

Segmentation:  Standard.

Alignment:  No substantial sagittal subluxation.

Vertebrae: Vertebral body heights are maintained.
Degenerative/discogenic endplate signal changes about the right
eccentric L2-L3 disc. Otherwise, no focal marrow edema to suggest
acute fracture discitis/osteomyelitis. Heterogeneous bone marrow
without suspicious bone lesion.

Conus medullaris and cauda equina: Conus extends to the T12-L1
level. Conus appears normal.

Paraspinal and other soft tissues: Unremarkable.

Disc levels:

T12-L1: Mild disc bulging without significant stenosis.

L1-L2: Mild disc bulging and bilateral facet arthropathy without
significant stenosis.

L2-L3: Left eccentric disc height loss. Left eccentric disc bulging
and endplate spurring. Bilateral facet arthropathy. Resulting mild
to moderate canal and left greater than right subarticular recess
stenosis. Mild-to-moderate left foraminal stenosis with left far
lateral disc closely approximating the exiting left L2 nerve.

L3-L4: Broad disc bulge with ligamentum flavum thickening and right
greater than left facet arthropathy. Resulting moderate canal
stenosis with moderate to severe right subarticular recess and
foraminal stenosis. Mild left foraminal stenosis.

L4-L5: Broad disc bulge with ligamentum flavum thickening and
bilateral facet arthropathy. Resulting mild to moderate canal and
bilateral subarticular recess stenosis. Mild bilateral foraminal
stenosis.

L5-S1: Disc bulging with bilateral facet arthropathy. No significant
canal or foraminal stenosis.
IMPRESSION: MR THORACIC SPINE IMPRESSION

1. Moderate foraminal stenosis on the right at T1-T2 and the left at
T8-T9. Otherwise, multilevel mild-to-moderate foraminal stenosis.
2. Mild canal stenosis at T10-T11.

MR LUMBAR SPINE IMPRESSION

1. At L3-L4, moderate to severe right subarticular recess and
foraminal stenosis with moderate canal stenosis.
2. At the L2-L3, mild-to-moderate canal and left greater than right
subarticular recess stenosis with mild-to-moderate left foraminal
stenosis and left far lateral disc closely approximating the exiting
left L2 nerve.
3. At L4-L5, mild-to-moderate canal and bilateral subarticular
recess stenosis with mild bilateral foraminal stenosis.

## 2021-05-02 IMAGING — RF DG SPINAL PUNCT LUMBAR DIAG WITH FL CT GUIDANCE
1 series · 1 of 1 positions shown · non-contrast
Comparison: CT head [DATE], MR head [DATE]

CLINICAL DATA: Cutaneous amyloidosis, headache, ataxia

EXAM:
DIAGNOSTIC LUMBAR PUNCTURE UNDER FLUOROSCOPIC GUIDANCE

[Series 1: cp_standard · 0.17mm/px · 1 of 1 slices shown]
[im 1/1]
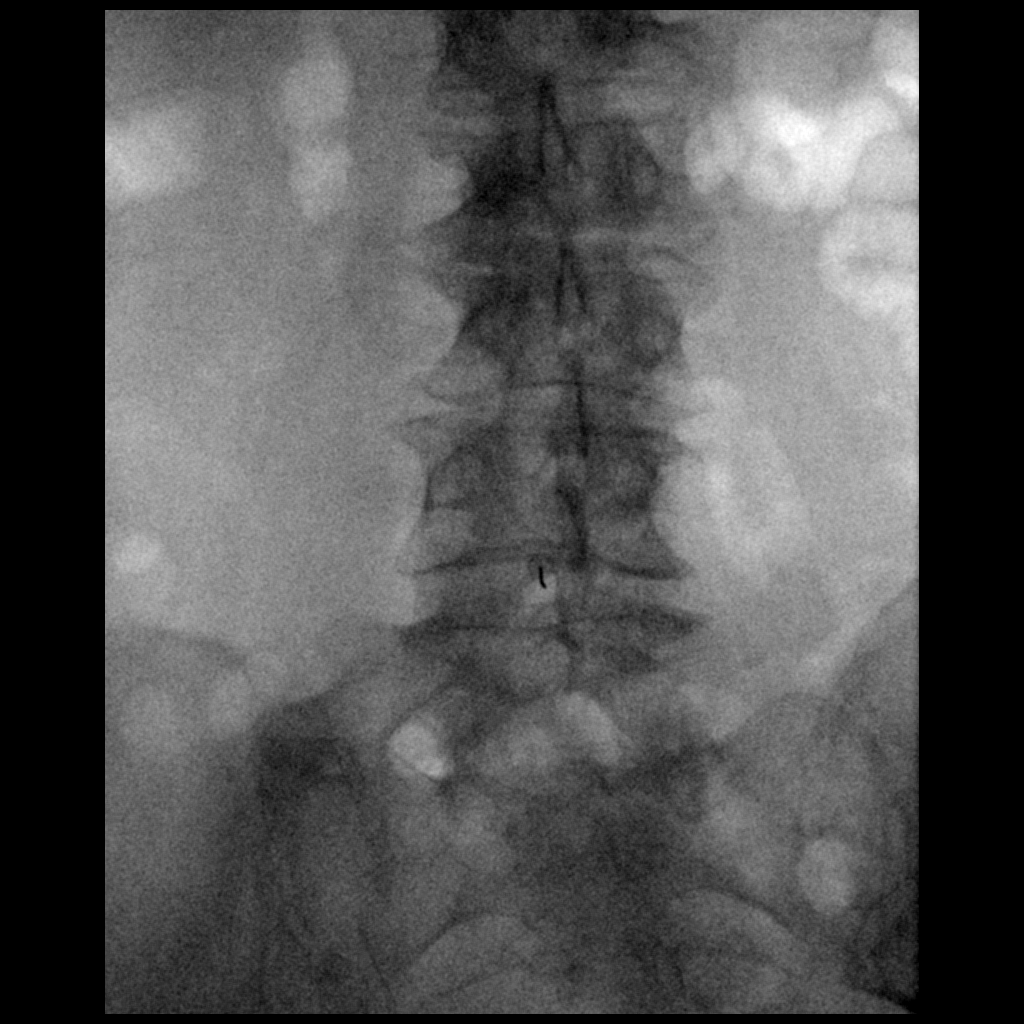

[1 of 1 positions shown; findings below may reference images not displayed]

FLUOROSCOPY TIME:  Fluoroscopy Time:  0 minutes 24 seconds

Radiation Exposure Index (if provided by the fluoroscopic device):
8.70 mGy

Number of Acquired Spot Images: 1

PROCEDURE:
Procedure, benefits, and risks were discussed with the patient,
including alternatives.

Patient's questions were answered.

Written informed consent was obtained.

Timeout protocol followed.

Patient placed prone.

L4-L5 disc space was localized under fluoroscopy.

Skin prepped and draped in usual sterile fashion.

Skin and soft tissues anesthetized with 2 mL of 1% lidocaine.

22 gauge needle was advanced into the spinal canal where clear
colorless CSF was encountered.

11 mL of CSF was obtained in 4 tubes for requested analysis.

Procedure tolerated very well by patient without immediate
complication.
IMPRESSION: Successful fluoro guided lumbar puncture as above.

## 2021-05-02 IMAGING — MR MR HEAD W/O CM
11 of 12 series · 42 of 48 positions shown · non-contrast
Comparison: None.

CLINICAL DATA: Neuro deficit, acute, stroke suspected

EXAM:
MRI HEAD WITHOUT CONTRAST
TECHNIQUE: Multiplanar, multiecho pulse sequences of the brain and surrounding
structures were obtained without intravenous contrast.

[Series 5: DWI · axial · 3.0mm · 0.77mm/px · z∈[-42,+102]mm · 4 of 50 slices shown (1 of 4)]
[im 1/50]
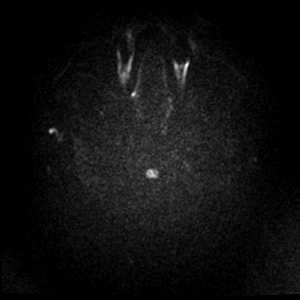
[im 17/50]
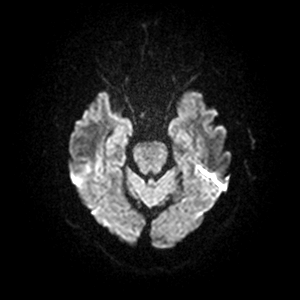
[im 33/50]
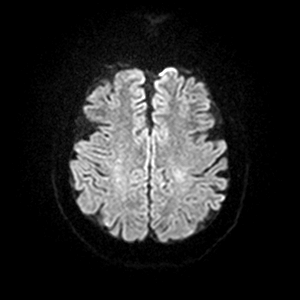
[im 50/50]
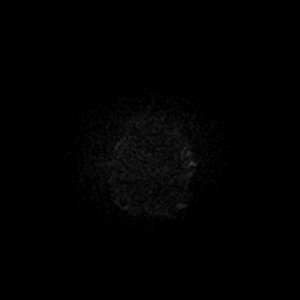

[Series 6: DWI · axial · 3.0mm · 0.77mm/px · z∈[-42,+102]mm · 4 of 50 slices shown (2 of 4)]
[im 1/50]
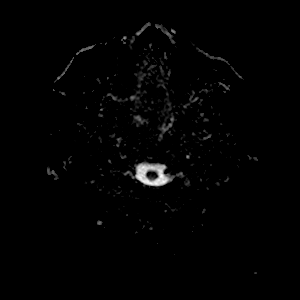
[im 17/50]
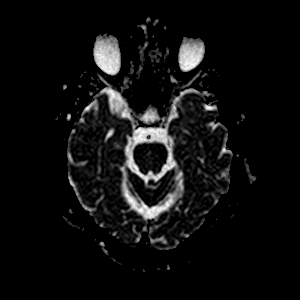
[im 33/50]
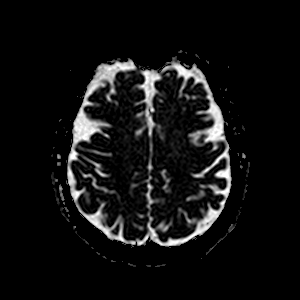
[im 50/50]
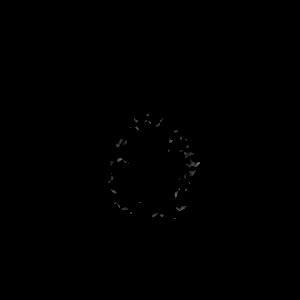

[Series 7: DWI · coronal · 5.0mm · 0.88mm/px · 2 of 28 slices shown (3 of 4)]
[im 1/28]
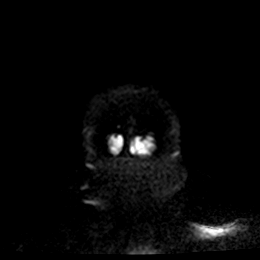
[im 28/28]
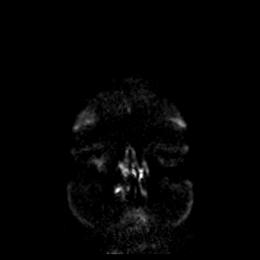

[Series 8: DWI · coronal · 5.0mm · 0.88mm/px · 3 of 28 slices shown (4 of 4)]
[im 1/28]
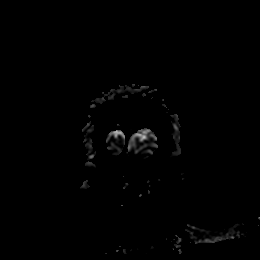
[im 14/28]
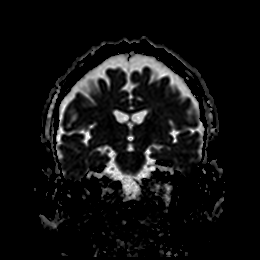
[im 28/28]
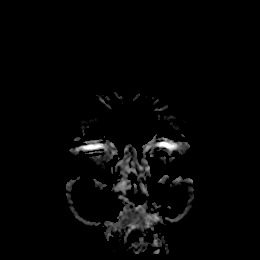

[Series 9: T1 · sagittal · 5.0mm · 0.80mm/px · 2 of 23 slices shown]
[im 1/23]
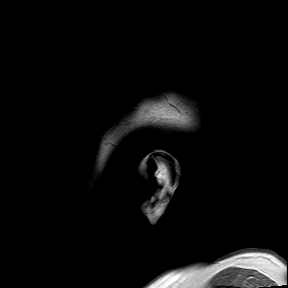
[im 23/23]
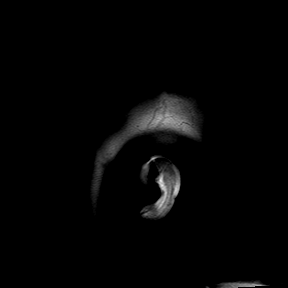

[Series 10: T2 · axial · 5.0mm · 0.72mm/px · z∈[-45,+106]mm · 2 of 23 slices shown (1 of 2)]
[im 1/23]
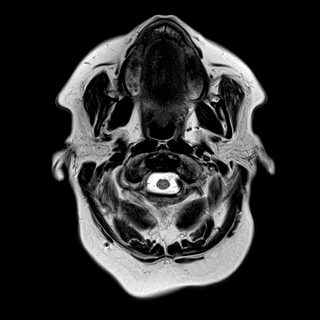
[im 23/23]
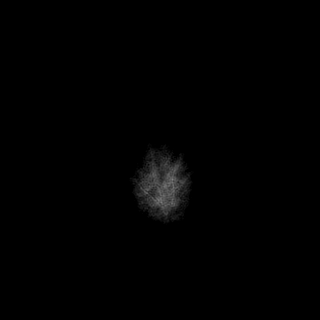

[Series 11: mag_images · axial · 3.0mm · 0.90mm/px · z∈[-59,+115]mm · 6 of 60 slices shown]
[im 1/60]
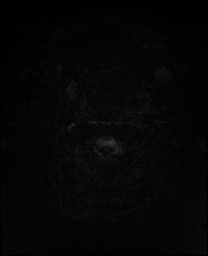
[im 12/60]
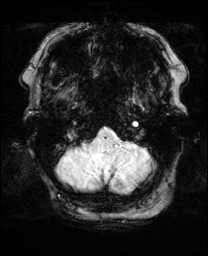
[im 24/60]
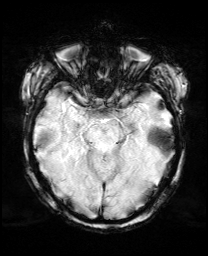
[im 36/60]
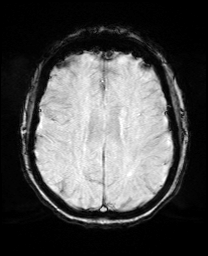
[im 48/60]
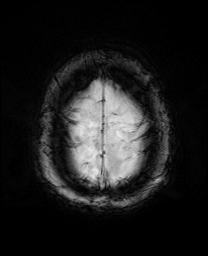
[im 60/60]
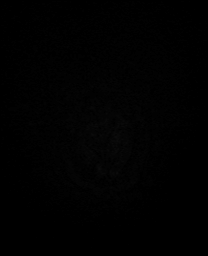

[Series 12: pha_images · axial · 3.0mm · 0.90mm/px · z∈[-59,+112]mm · 6 of 59 slices shown]
[im 1/59]
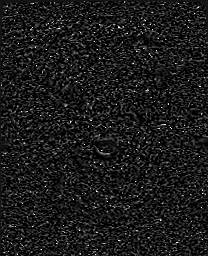
[im 12/59]
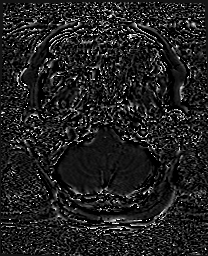
[im 24/59]
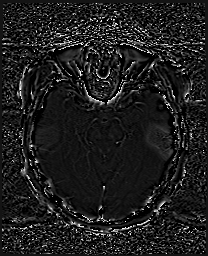
[im 35/59]
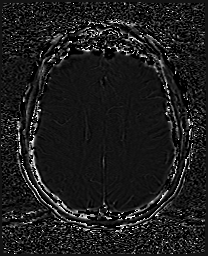
[im 47/59]
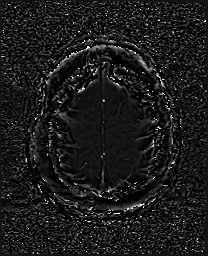
[im 59/59]
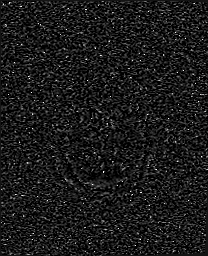

[Series 13: swi_images · axial · 3.0mm · 0.90mm/px · z∈[-59,+115]mm · 6 of 60 slices shown]
[im 1/60]
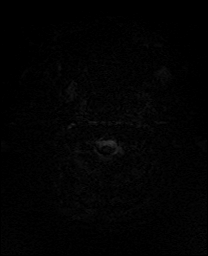
[im 12/60]
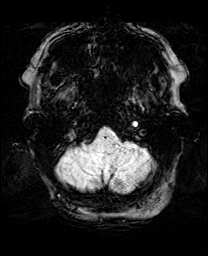
[im 24/60]
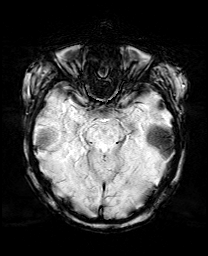
[im 36/60]
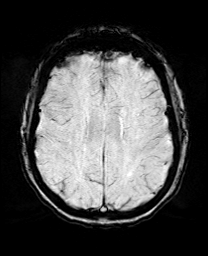
[im 48/60]
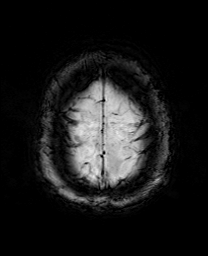
[im 60/60]
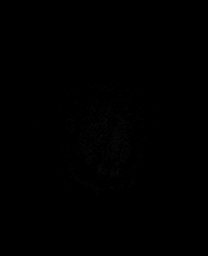

[Series 15: FLAIR · axial · 4.0mm · 0.43mm/px · z∈[-45,+100]mm · 4 of 38 slices shown]
[im 1/38]
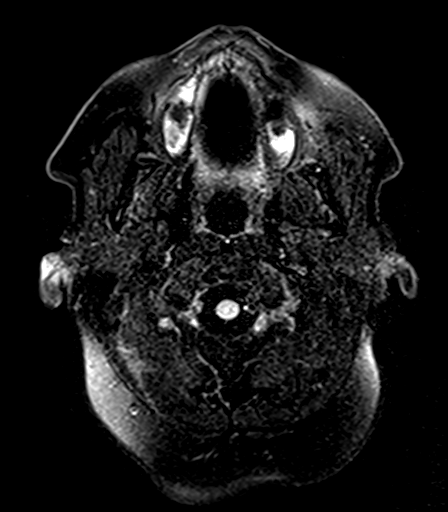
[im 13/38]
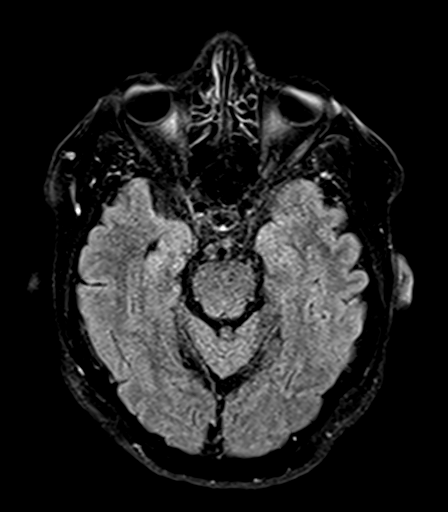
[im 25/38]
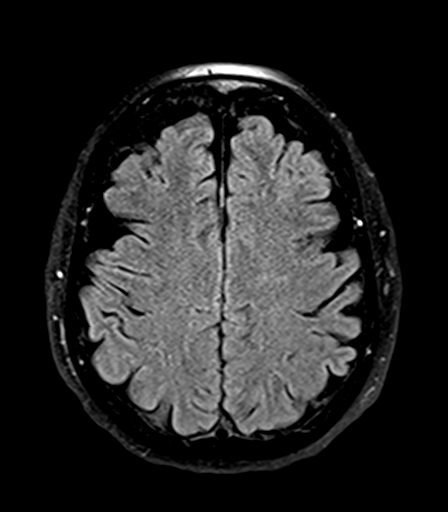
[im 38/38]
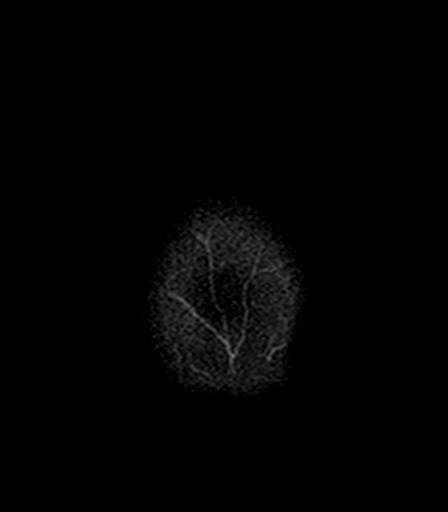

[Series 17: T2 · coronal · 5.0mm · 0.72mm/px · 3 of 28 slices shown (2 of 2)]
[im 1/28]
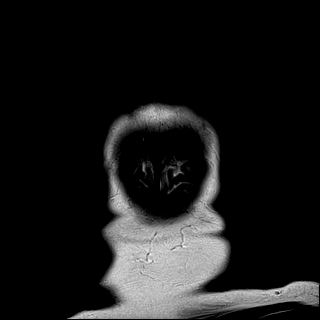
[im 14/28]
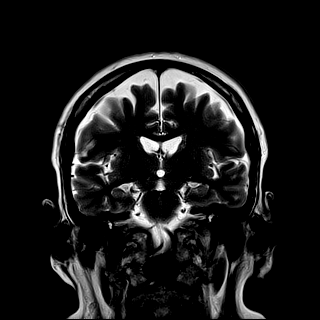
[im 28/28]
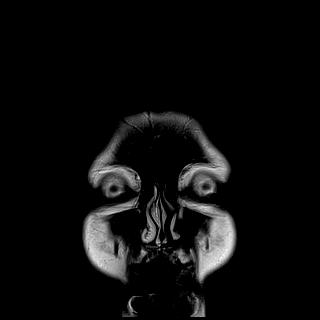

[42 of 48 positions shown; findings below may reference images not displayed]

FINDINGS: Brain: No acute infarction, hemorrhage, hydrocephalus, extra-axial
collection or mass lesion. Mild for age scattered T2
hyperintensities in the supratentorial and pontine white matter,
nonspecific but compatible with chronic microvascular disease. Mild
atrophy.

Vascular: Major arterial flow voids are maintained at the skull
base.

Skull and upper cervical spine: Normal marrow signal.

Sinuses/Orbits: Clear sinuses.  Unremarkable orbits.

Other: No mastoid effusions.
IMPRESSION: 1. No evidence of acute intracranial abnormality, including acute
infarct.
2. Mild chronic microvascular ischemic disease and atrophy.

## 2021-05-02 IMAGING — MR MR MRV HEAD W/O CM
1 series · 48 of 48 positions shown · non-contrast
Comparison: No pertinent prior exam.

CLINICAL DATA: Headache, new or worsening (Age >= 50y)

EXAM:
MR VENOGRAM HEAD WITHOUT CONTRAST
MR ANGIOGRAPHY HEAD WITHOUT CONTRAST
TECHNIQUE: Angiographic images of the intracranial venous structures were
acquired using MRV technique without intravenous contrast.
Angiographic images of the Circle of Willis were acquired using MRA
technique without intravenous contrast.

[Series 10: TOF · coronal · 2.5mm · 0.98mm/px · 48 of 117 slices shown]
[im 1/117]
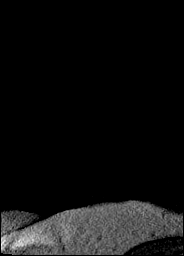
[im 3/117]
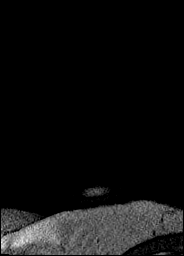
[im 5/117]
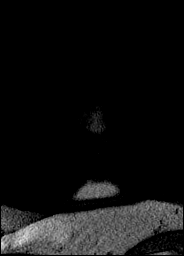
[im 8/117]
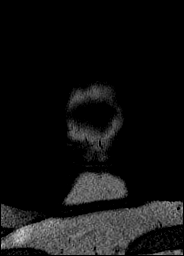
[im 10/117]
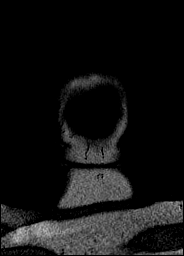
[im 13/117]
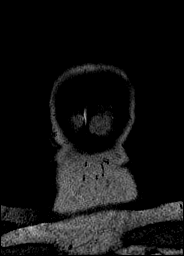
[im 15/117]
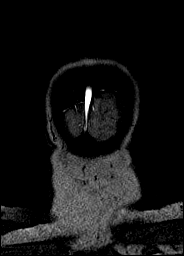
[im 18/117]
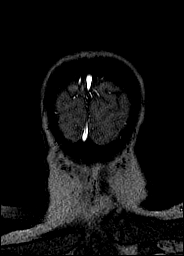
[im 20/117]
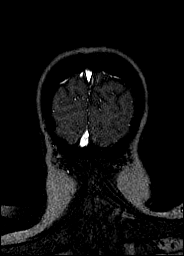
[im 23/117]
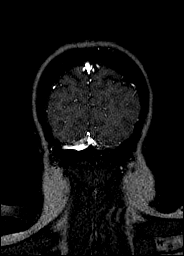
[im 25/117]
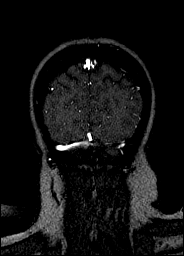
[im 28/117]
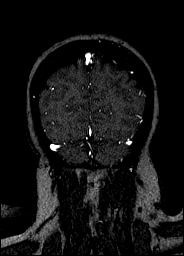
[im 30/117]
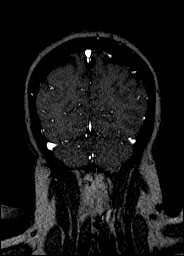
[im 33/117]
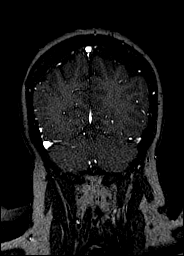
[im 35/117]
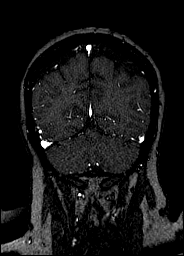
[im 38/117]
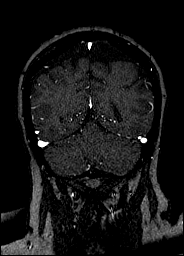
[im 40/117]
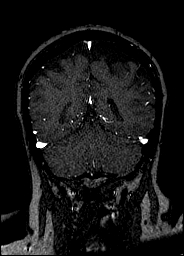
[im 42/117]
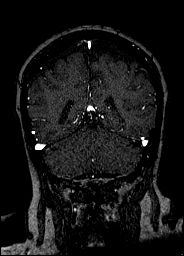
[im 45/117]
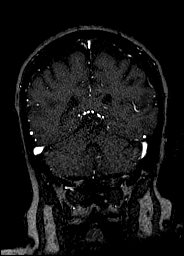
[im 47/117]
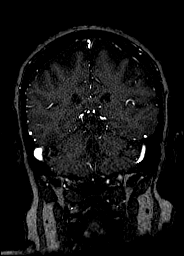
[im 50/117]
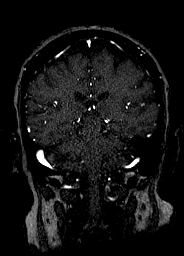
[im 52/117]
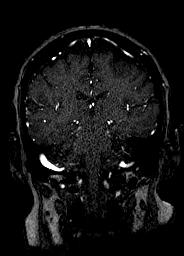
[im 55/117]
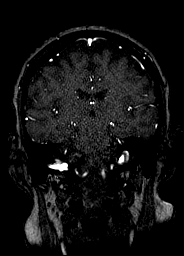
[im 57/117]
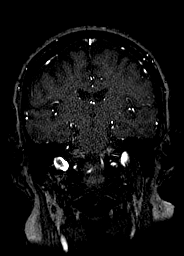
[im 60/117]
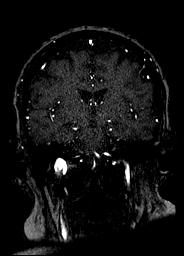
[im 62/117]
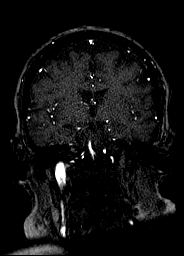
[im 65/117]
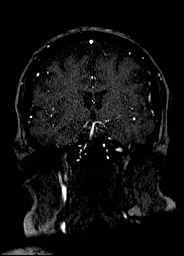
[im 67/117]
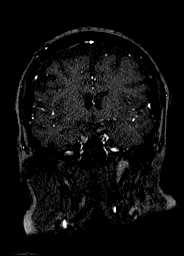
[im 70/117]
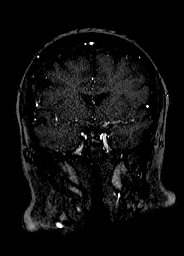
[im 72/117]
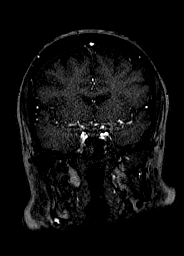
[im 75/117]
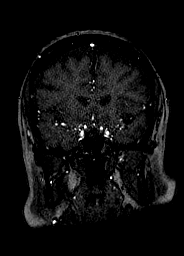
[im 77/117]
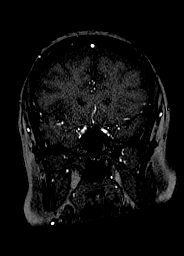
[im 79/117]
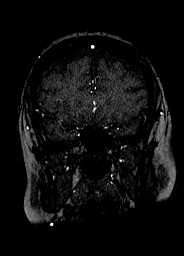
[im 82/117]
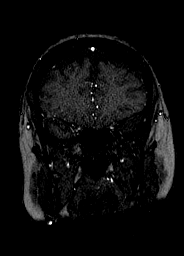
[im 84/117]
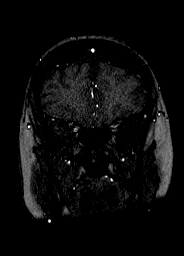
[im 87/117]
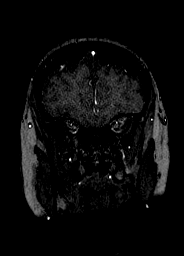
[im 89/117]
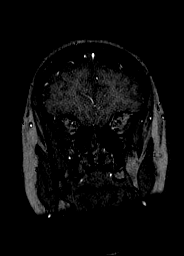
[im 92/117]
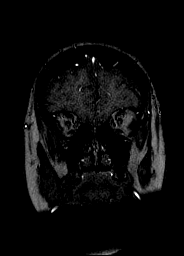
[im 94/117]
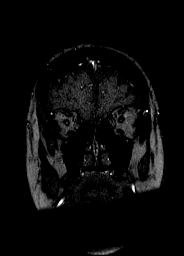
[im 97/117]
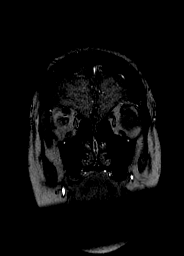
[im 99/117]
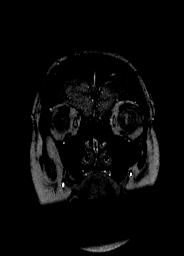
[im 102/117]
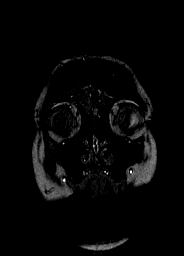
[im 104/117]
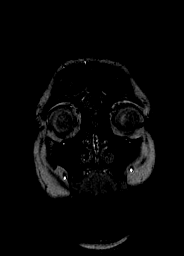
[im 107/117]
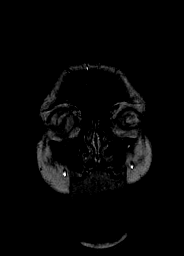
[im 109/117]
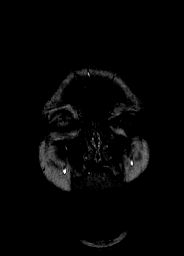
[im 112/117]
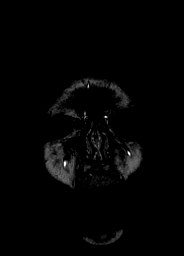
[im 114/117]
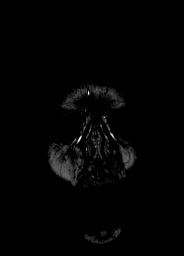
[im 117/117]
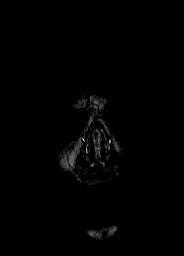

[48 of 48 positions shown; findings below may reference images not displayed]

FINDINGS: MRA head:

Intracranial internal carotid arteries are patent. There is a 1.5 mm
inferiorly directed outpouching from the distal supraclinoid right
ICA. There is likely a small vessel arising from this. Included
anterior and middle cerebral arteries are patent. Intracranial
vertebral arteries are patent. Basilar artery is patent. Major
cerebellar artery origins are patent. Posterior cerebral arteries
are patent.

MRV head:

Superior sagittal sinus, straight sinus, vein of WEILBACH, internal
cerebral veins, and basal veins are patent. Both transverse and
sigmoid sinuses are patent. Right transverse sinus is dominant.
IMPRESSION: No proximal intracranial vessel occlusion. No evidence of dural
sinus thrombosis.

1.5 mm outpouching from distal supraclinoid right ICA likely
represents an infundibulum rather than aneurysm.

## 2021-05-02 IMAGING — MR MR MRA HEAD W/O CM
1 series · 20 of 48 positions shown · non-contrast
Comparison: No pertinent prior exam.

CLINICAL DATA: Headache, new or worsening (Age >= 50y)

EXAM:
MR VENOGRAM HEAD WITHOUT CONTRAST
MR ANGIOGRAPHY HEAD WITHOUT CONTRAST
TECHNIQUE: Angiographic images of the intracranial venous structures were
acquired using MRV technique without intravenous contrast.
Angiographic images of the Circle of Willis were acquired using MRA
technique without intravenous contrast.

[Series 5: TOF · axial · 0.4mm · 0.43mm/px · z∈[-78,+10]mm · 20 of 232 slices shown]
[im 1/232]
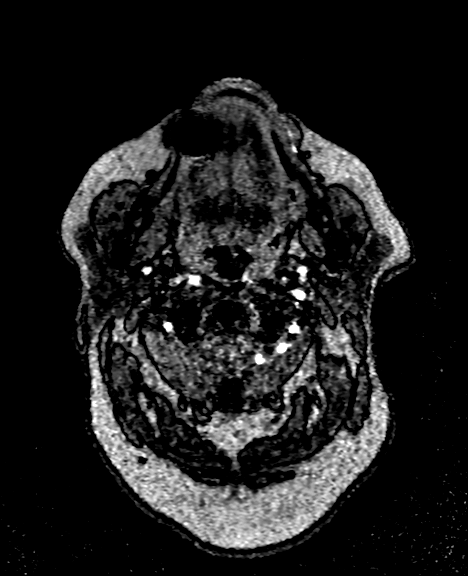
[im 5/232]
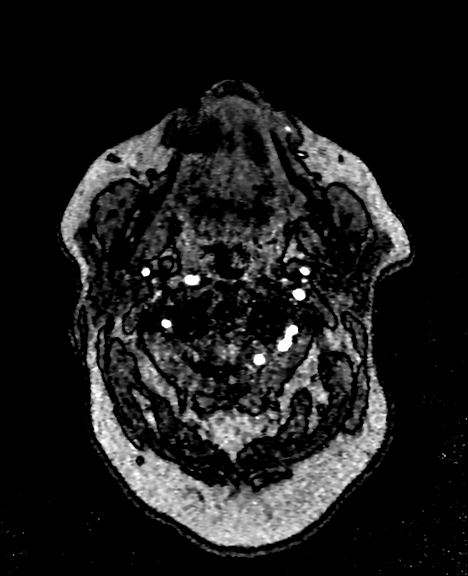
[im 10/232]
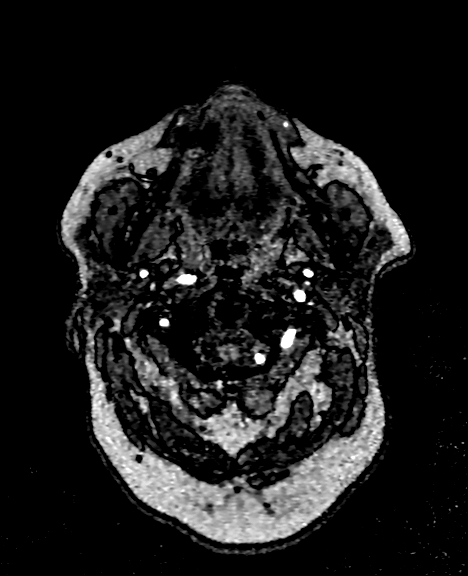
[im 15/232]
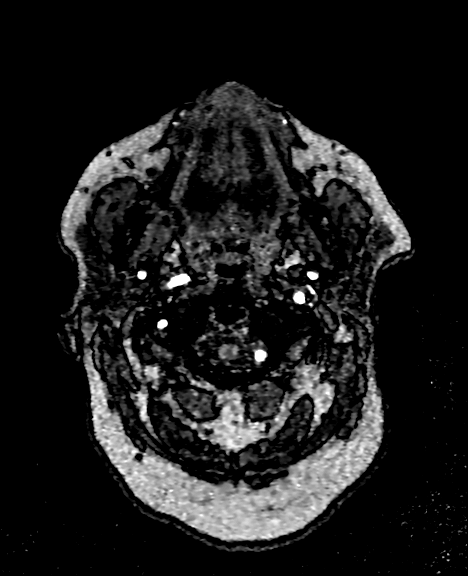
[im 20/232]
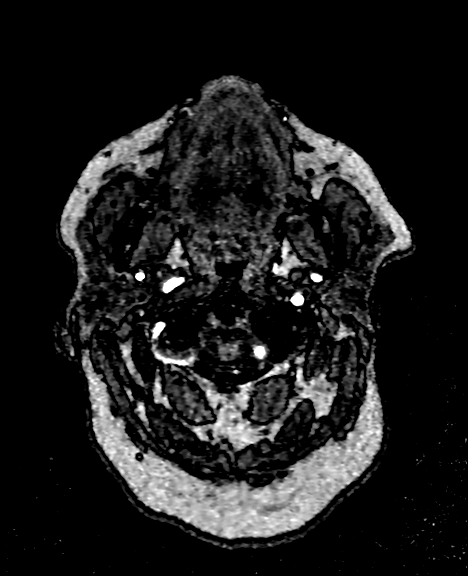
[im 25/232]
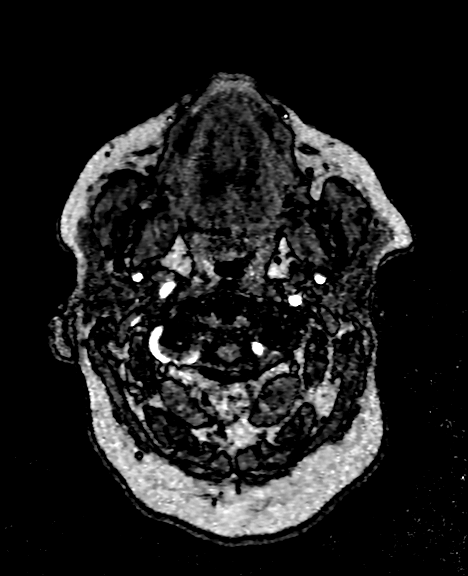
[im 30/232]
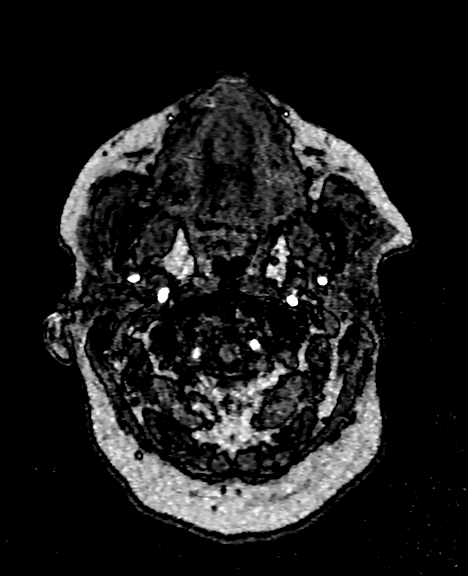
[im 35/232]
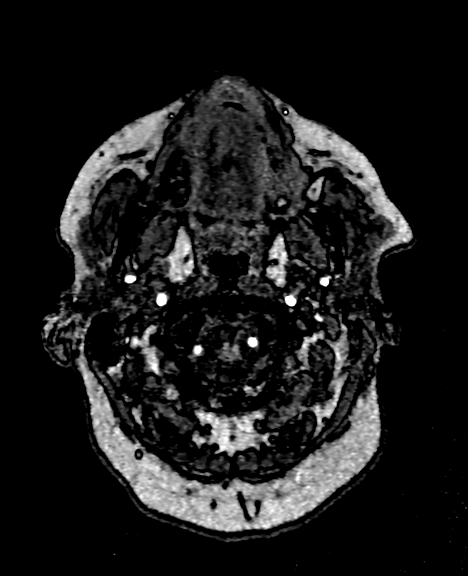
[im 40/232]
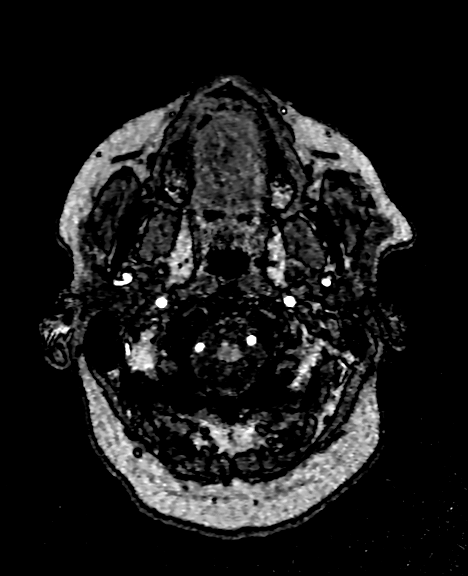
[im 45/232]
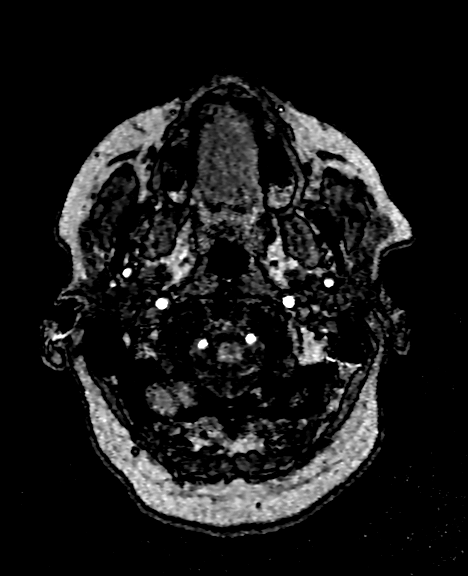
[im 50/232]
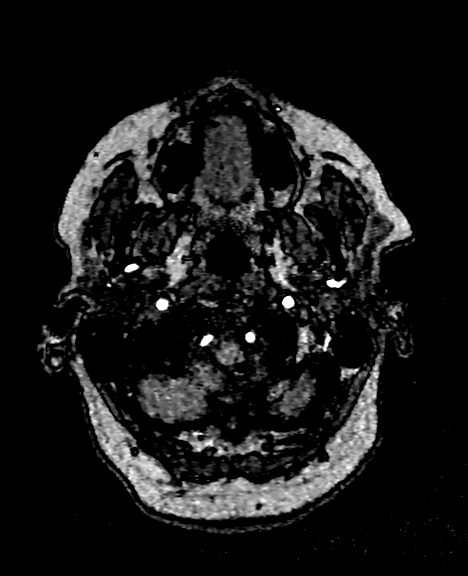
[im 55/232]
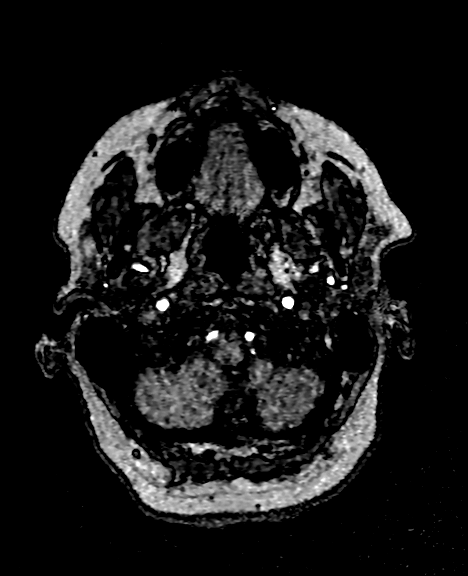
[im 74/232]
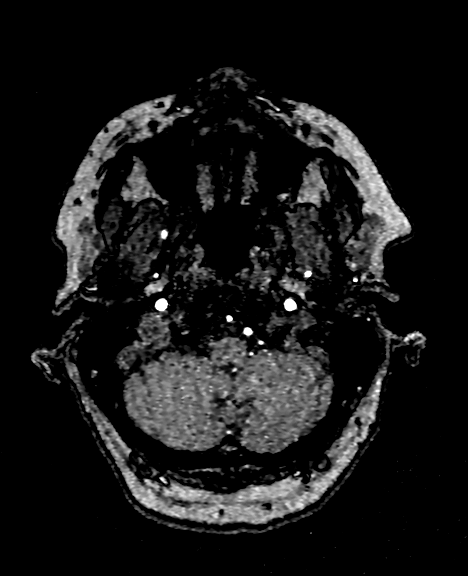
[im 104/232]
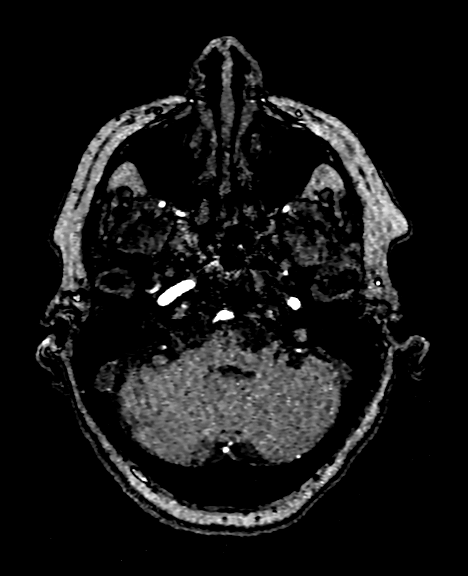
[im 118/232]
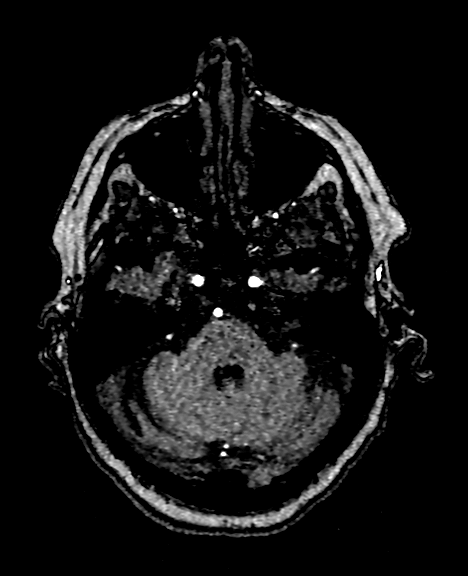
[im 133/232]
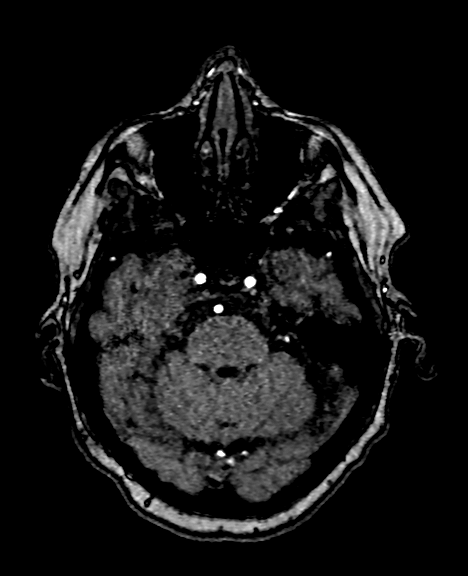
[im 163/232]
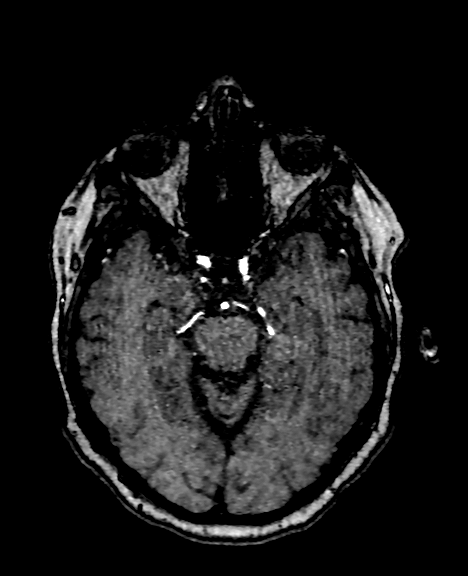
[im 192/232]
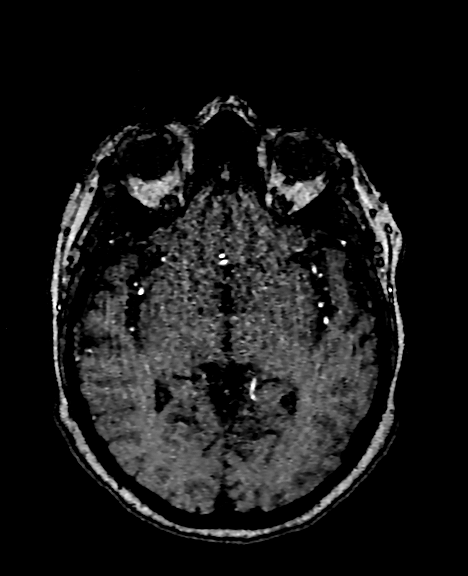
[im 197/232]
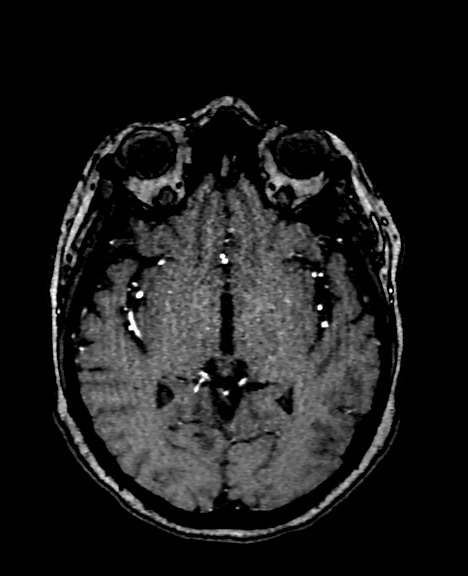
[im 222/232]
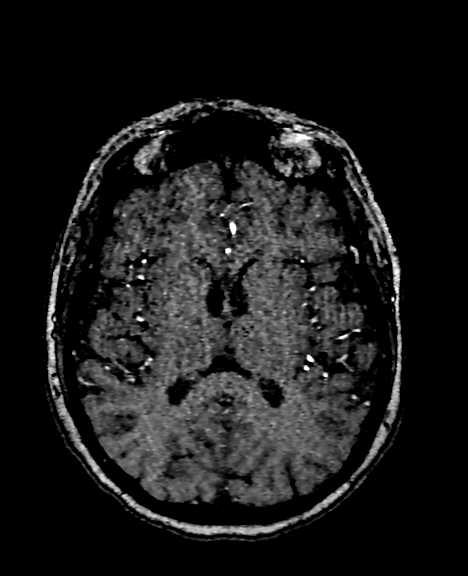

[20 of 48 positions shown; findings below may reference images not displayed]

FINDINGS: MRA head:

Intracranial internal carotid arteries are patent. There is a 1.5 mm
inferiorly directed outpouching from the distal supraclinoid right
ICA. There is likely a small vessel arising from this. Included
anterior and middle cerebral arteries are patent. Intracranial
vertebral arteries are patent. Basilar artery is patent. Major
cerebellar artery origins are patent. Posterior cerebral arteries
are patent.

MRV head:

Superior sagittal sinus, straight sinus, vein of WEILBACH, internal
cerebral veins, and basal veins are patent. Both transverse and
sigmoid sinuses are patent. Right transverse sinus is dominant.
IMPRESSION: No proximal intracranial vessel occlusion. No evidence of dural
sinus thrombosis.

1.5 mm outpouching from distal supraclinoid right ICA likely
represents an infundibulum rather than aneurysm.

## 2021-05-02 MED ORDER — PROCHLORPERAZINE EDISYLATE 10 MG/2ML IJ SOLN
10.0000 mg | Freq: Once | INTRAMUSCULAR | Status: AC
  Administered 2021-05-02: 10 mg via INTRAVENOUS
  Filled 2021-05-02: qty 2

## 2021-05-02 MED ORDER — SODIUM CHLORIDE 0.9 % IV BOLUS
1000.0000 mL | Freq: Once | INTRAVENOUS | Status: AC
  Administered 2021-05-02: 1000 mL via INTRAVENOUS

## 2021-05-02 MED ORDER — LIDOCAINE HCL (PF) 1 % IJ SOLN
INTRAMUSCULAR | Status: AC
  Filled 2021-05-02: qty 5

## 2021-05-02 MED ORDER — ACETAMINOPHEN 160 MG/5ML PO SOLN: 650.0000 mg | ORAL | Status: DC | PRN

## 2021-05-02 MED ORDER — INSULIN ASPART 100 UNIT/ML IJ SOLN
0.0000 [IU] | Freq: Every day | INTRAMUSCULAR | Status: DC
  Administered 2021-05-02: 3 [IU] via SUBCUTANEOUS
  Administered 2021-05-04: 2 [IU] via SUBCUTANEOUS

## 2021-05-02 MED ORDER — DICYCLOMINE HCL 20 MG PO TABS
20.0000 mg | ORAL_TABLET | Freq: Three times a day (TID) | ORAL | Status: DC
  Administered 2021-05-02 – 2021-05-12 (×39): 20 mg via ORAL
  Filled 2021-05-02 (×51): qty 1

## 2021-05-02 MED ORDER — SENNOSIDES-DOCUSATE SODIUM 8.6-50 MG PO TABS: 1.0000 | ORAL_TABLET | Freq: Every evening | ORAL | Status: DC | PRN

## 2021-05-02 MED ORDER — INSULIN GLARGINE-YFGN 100 UNIT/ML ~~LOC~~ SOLN
10.0000 [IU] | Freq: Every day | SUBCUTANEOUS | Status: DC
  Administered 2021-05-03 – 2021-05-12 (×10): 10 [IU] via SUBCUTANEOUS
  Filled 2021-05-02 (×14): qty 0.1

## 2021-05-02 MED ORDER — LEVOTHYROXINE SODIUM 50 MCG PO TABS
50.0000 ug | ORAL_TABLET | Freq: Every day | ORAL | Status: DC
Start: 2021-05-03 — End: 2021-05-13
  Administered 2021-05-03 – 2021-05-12 (×10): 50 ug via ORAL
  Filled 2021-05-02 (×8): qty 1
  Filled 2021-05-02: qty 2
  Filled 2021-05-02: qty 1

## 2021-05-02 MED ORDER — VENLAFAXINE HCL ER 75 MG PO CP24
75.0000 mg | ORAL_CAPSULE | Freq: Every day | ORAL | Status: DC
  Administered 2021-05-02 – 2021-05-09 (×8): 75 mg via ORAL
  Filled 2021-05-02 (×8): qty 1

## 2021-05-02 MED ORDER — STROKE: EARLY STAGES OF RECOVERY BOOK
Freq: Once | Status: AC
  Administered 2021-05-02: 1

## 2021-05-02 MED ORDER — MAGNESIUM SULFATE 2 GM/50ML IV SOLN
2.0000 g | Freq: Once | INTRAVENOUS | Status: AC
  Administered 2021-05-02: 2 g via INTRAVENOUS
  Filled 2021-05-02: qty 50

## 2021-05-02 MED ORDER — BASAGLAR KWIKPEN 100 UNIT/ML ~~LOC~~ SOPN
10.0000 [IU] | PEN_INJECTOR | Freq: Every day | SUBCUTANEOUS | Status: DC
  Filled 2021-05-02: qty 3

## 2021-05-02 MED ORDER — TRANDOLAPRIL 2 MG PO TABS
2.0000 mg | ORAL_TABLET | Freq: Every day | ORAL | Status: DC
  Administered 2021-05-02: 2 mg via ORAL
  Filled 2021-05-02 (×4): qty 1

## 2021-05-02 MED ORDER — HEPARIN SODIUM (PORCINE) 5000 UNIT/ML IJ SOLN
5000.0000 [IU] | Freq: Three times a day (TID) | INTRAMUSCULAR | Status: DC
  Administered 2021-05-02 (×2): 5000 [IU] via SUBCUTANEOUS
  Filled 2021-05-02 (×2): qty 1

## 2021-05-02 MED ORDER — RISPERIDONE 1 MG PO TBDP
1.0000 mg | ORAL_TABLET | Freq: Two times a day (BID) | ORAL | Status: DC
  Administered 2021-05-02: 1 mg via ORAL
  Filled 2021-05-02 (×7): qty 1

## 2021-05-02 MED ORDER — ACETAMINOPHEN 325 MG PO TABS
650.0000 mg | ORAL_TABLET | ORAL | Status: DC | PRN
  Administered 2021-05-03 – 2021-05-12 (×4): 650 mg via ORAL
  Filled 2021-05-02 (×4): qty 2

## 2021-05-02 MED ORDER — ACETAMINOPHEN 650 MG RE SUPP: 650.0000 mg | RECTAL | Status: DC | PRN

## 2021-05-02 MED ORDER — INSULIN ASPART 100 UNIT/ML IJ SOLN
0.0000 [IU] | Freq: Three times a day (TID) | INTRAMUSCULAR | Status: DC
  Administered 2021-05-02: 8 [IU] via SUBCUTANEOUS
  Administered 2021-05-03: 5 [IU] via SUBCUTANEOUS
  Administered 2021-05-03 – 2021-05-04 (×4): 3 [IU] via SUBCUTANEOUS
  Administered 2021-05-04: 5 [IU] via SUBCUTANEOUS
  Administered 2021-05-05 (×2): 3 [IU] via SUBCUTANEOUS
  Administered 2021-05-05: 8 [IU] via SUBCUTANEOUS
  Administered 2021-05-06 – 2021-05-07 (×6): 3 [IU] via SUBCUTANEOUS
  Administered 2021-05-08 (×2): 2 [IU] via SUBCUTANEOUS
  Administered 2021-05-08: 06:00:00 3 [IU] via SUBCUTANEOUS
  Administered 2021-05-09 – 2021-05-11 (×7): 2 [IU] via SUBCUTANEOUS
  Administered 2021-05-12: 3 [IU] via SUBCUTANEOUS
  Administered 2021-05-12: 2 [IU] via SUBCUTANEOUS
  Administered 2021-05-12: 3 [IU] via SUBCUTANEOUS

## 2021-05-02 MED ORDER — OXYCODONE-ACETAMINOPHEN 5-325 MG PO TABS
1.0000 | ORAL_TABLET | Freq: Four times a day (QID) | ORAL | Status: DC | PRN
  Administered 2021-05-02 – 2021-05-12 (×17): 1 via ORAL
  Filled 2021-05-02 (×18): qty 1

## 2021-05-02 MED ORDER — HEPARIN SODIUM (PORCINE) 5000 UNIT/ML IJ SOLN: 5000.0000 [IU] | Freq: Three times a day (TID) | INTRAMUSCULAR | Status: DC

## 2021-05-02 MED ORDER — RISPERIDONE 1 MG PO TBDP
1.0000 mg | ORAL_TABLET | Freq: Two times a day (BID) | ORAL | Status: DC
  Administered 2021-05-03 – 2021-05-12 (×19): 1 mg via ORAL
  Filled 2021-05-02 (×25): qty 1

## 2021-05-02 MED ORDER — KETOROLAC TROMETHAMINE 30 MG/ML IJ SOLN
30.0000 mg | Freq: Once | INTRAMUSCULAR | Status: AC
  Administered 2021-05-02: 30 mg via INTRAVENOUS
  Filled 2021-05-02: qty 1

## 2021-05-02 MED ORDER — AMLODIPINE BESYLATE 5 MG PO TABS: 5.0000 mg | ORAL_TABLET | Freq: Every day | ORAL | Status: DC

## 2021-05-02 NOTE — Care Management Obs Status (Signed)
MEDICARE OBSERVATION STATUS NOTIFICATION   Patient Details  Name: Sheryl Henry MRN: [id number] Date of Birth: 1956-06-01   Medicare Observation Status Notification Given:  Yes    Corey Harold 05/02/2021, 2:30 PM

## 2021-05-02 NOTE — ED Notes (Signed)
Pt assisted to [facility] and back to bed with minimal assistance; pt given meal tray and diet ginger ale

## 2021-05-02 NOTE — Progress Notes (Signed)
Patient reports pain 8/10.  Her home meds have been restarted. She does take pain medications at home chronically.

## 2021-05-02 NOTE — Consult Note (Addendum)
I connected with  Sheryl Henry on 05/02/21 by a video enabled telemedicine application and verified that I am speaking with the correct person using two identifiers.   I discussed the limitations of evaluation and management by telemedicine. The patient expressed understanding and agreed to proceed.  [facility] For location of physician: [facility]  Neurology Consultation Reason for Consult: Headache, gait instability Referring Physician: Dr Standley Dakins  CC: Headache, gait instability  History is obtained from: Patient  HPI: Sheryl Henry is a 65 y.o. female with past medical history of nodular cutaneous amyloidosis, diabetes mellitus type 2, hypertension who presented due to sudden onset headache and gait instability.  Patient states she took her flu and COVID shot on 04/30/2021.  She felt normal rest of the day.  On Thursday (05/01/2021) she was okay until around 1400.  States she had her lunch and then sat down on the recliner.  She then suddenly felt this weird sensation in her head, not necessarily pain but makes her feel uncomfortable and she cannot think clearly, mainly bifrontal region, moderate in intensity which is since improved, associated with floaters in both eyes.  She tried to get up to get something to drink and felt like she is "drunk".  She therefore called EMS and was brought to [facility].  She denies ever having similar symptoms in the past, denies history of headaches, migraines, recent medication changes, cough, cold, sore throat.  She reports having auditory hallucinations in the past when she was on steroids which resolved once steroids were stopped.  States that she is still feeling weak in her legs when she walks but denies any paresthesias, bowel/bladder incontinence.   ROS: All other systems reviewed and negative except as noted in the HPI.   Past Medical History:  Diagnosis Date   Cutaneous amyloidosis (HCC)     Diabetes mellitus without complication (HCC)    Hypertension    Family history: Patient denies family history of stroke.   Social History:  reports that she has never smoked. She has never used smokeless tobacco. She reports that she does not currently use alcohol. She reports that she does not use drugs.   Medications Prior to Admission  Medication Sig Dispense Refill Last Dose   cetirizine (ZYRTEC) 10 MG tablet Take 1 tablet by mouth daily.   05/01/2021   CINNAMON PO Take 1 capsule by mouth in the morning and at bedtime.   05/01/2021   Coenzyme Q10 (COQ10 PO) Take 1 tablet by mouth daily.   05/01/2021   CRANBERRY PO Take 1 tablet by mouth in the morning and at bedtime.   05/01/2021   dicyclomine (BENTYL) 20 MG tablet Take 20 mg by mouth 4 (four) times daily.   05/01/2021   Insulin Glargine (BASAGLAR KWIKPEN) 100 UNIT/ML Inject 20 Units into the skin daily.   05/01/2021   levothyroxine (SYNTHROID) 50 MCG tablet Take 50 mcg by mouth daily.   05/01/2021   naproxen sodium (ALEVE) 220 MG tablet Take 220 mg by mouth daily as needed (PAIN).   PRN   NON FORMULARY "Circulation and Vein support" MVI            Hesperidin  100 mg Vitamin C 30 mg                                     Rutin  20 mg  Flavonoids 720 mg                                 Grape seed extract   40 mg Diosmin  600 mg                                    Pine Bark Extract   40 mg Take 1 capsule by mouth every morning Milagros Loll extract 40 mg   05/01/2021   Omega-3 1000 MG CAPS Take 1 capsule by mouth in the morning and at bedtime.   05/01/2021   oxyCODONE-acetaminophen (PERCOCET/ROXICET) 5-325 MG tablet Take 1 tablet by mouth 4 (four) times daily as needed for moderate pain.   05/01/2021   perindopril (ACEON) 4 MG tablet Take 4 mg by mouth daily.   05/01/2021   venlafaxine XR (EFFEXOR-XR) 75 MG 24 hr capsule Take 75 mg by mouth daily.   05/01/2021      Exam: Current vital signs: BP (!) 169/95   Pulse 88   Temp 99.4 F  (37.4 C) (Oral)   Resp 19   Ht 5\' 7"  (1.702 m)   Wt 106.9 kg   SpO2 97%   BMI 36.91 kg/m  Vital signs in last 24 hours: Temp:  [98 F (36.7 C)-99.7 F (37.6 C)] 99.4 F (37.4 C) (11/11 0857) Pulse Rate:  [79-102] 88 (11/11 1007) Resp:  [15-22] 19 (11/11 0857) BP: (143-184)/(68-102) 169/95 (11/11 1007) SpO2:  [91 %-97 %] 97 % (11/11 1007) Weight:  [81.6 kg-106.9 kg] 106.9 kg (11/11 0222)   Physical Exam  Constitutional: Appears well-developed and well-nourished.  Psych: Affect appropriate to situation Eyes: No scleral injection Neuro: AOx3, cranial nerves II to XII appear grossly intact, antigravity strength in all 4 extremities without any drift but patient states subjectively she feels weak.  Nurse help me examine sensation in her legs which appear intact to light touch  I have reviewed labs in epic and the results pertinent to this consultation are: CBC:  Recent Labs  Lab 05/01/21 1722  WBC 8.4  NEUTROABS 5.8  HGB 16.6*  HCT 46.0  MCV 88.3  PLT 150    Basic Metabolic Panel:  Lab Results  Component Value Date   NA 139 05/01/2021   K 4.0 05/01/2021   CO2 28 05/01/2021   GLUCOSE 206 (H) 05/01/2021   BUN 15 05/01/2021   CREATININE 0.63 05/01/2021   CALCIUM 8.8 (L) 05/01/2021   GFRNONAA >60 05/01/2021   Lipid Panel:  Lab Results  Component Value Date   LDLCALC 109 (H) 05/02/2021   HgbA1c:  Lab Results  Component Value Date   HGBA1C 7.3 (H) 05/02/2021   Urine Drug Screen: No results found for: LABOPIA, COCAINSCRNUR, LABBENZ, AMPHETMU, THCU, LABBARB  Alcohol Level No results found for: ETH   I have reviewed the images obtained:  CT head without contrast 05/01/2021: No acute abnormality  MRI brain without contrast 05/02/2021: No evidence of acute intracranial abnormality, including acute infarct. Mild chronic microvascular ischemic disease and atrophy  MRI T and L-spine without contrast 05/02/2021:  MR THORACIC SPINE IMPRESSION  1. Moderate  foraminal stenosis on the right at T1-T2 and the left at T8-T9. Otherwise, multilevel mild-to-moderate foraminal stenosis. 2. Mild canal stenosis at T10-T11.   MR LUMBAR SPINE IMPRESSION   1. At L3-L4, moderate to severe right subarticular recess and  foraminal stenosis with moderate canal stenosis. 2. At the L2-L3, mild-to-moderate canal and left greater than right subarticular recess stenosis with mild-to-moderate left foraminal stenosis and left far lateral disc closely approximating the exiting left L2 nerve. 3. At L4-L5, mild-to-moderate canal and bilateral subarticular recess stenosis with mild bilateral foraminal stenosis.  ASSESSMENT/PLAN: 65 year old female with sudden onset of headache and gait instability  Headache Gait instability - Symptoms of headache, floaters and gait instability could be secondary to vestibular migraine.  Another differential although less likely is venous thrombosis or basilar stenosis.  Unlikely subarachnoid hemorrhage -Patient also recently received flu shot and then had gait instability.  Therefore, GBS is on differential.  Recommendations -We will obtain MRA head and MRV head to look for venous sinus thrombosis, basilar stenosis -We will give migraine cocktail including IV Toradol, Compazine, my magnesium -On tele- neurology, it is difficult to examine patient's motor strength or reflexes.  Therefore, if all the tests are negative and patient's symptoms do not resolve, patient might need to be transferred to [facility] for lumbar puncture to look for albumino-cytological dissociation -Discussed plan in detail with patient and Dr. Laural Benes as well as patient's nurse  ADDENDUM - Reviewed MRA and MRV head 05/02/2021: No proximal intracranial vessel occlusion. No evidence of dural sinus thrombosis. 1.5 mm outpouching from distal supraclinoid right ICA likely represents an infundibulum rather than aneurysm.  Thank you for allowing [place] to participate  in the care of this patient. If you have any further questions, please contact  me or neurohospitalist.   I have spent a total of  85 minutes with the patient reviewing hospital notes,  test results, labs and examining the patient as well as establishing an assessment and plan that was discussed personally with the patient.  > 50% of time was spent in direct patient care.   Lindie Spruce Epilepsy [facility] neurohospitalist

## 2021-05-02 NOTE — Progress Notes (Signed)
Called to patient's room by nursing Tech, patient had been sitting on BSC in attempt to have bowel movement. Patient was not responsive. Very diaphoretic. Called Rapid Response. With assist of staff, patient lifted back to bed and vitals obtained. Patient BP was significantly low, and much lower than baseline throughout the day. Patient became arousable as placed in trendelenburg & sternal rub completed. A bolus was initiated, as bolus infused she became more arousable. Bp improved slightly throughout the course of rapid. Currently bedside with patient post rapid monitoring her and attempting to find another IV.

## 2021-05-02 NOTE — Evaluation (Signed)
Occupational Therapy Evaluation Patient Details Name: Sheryl Henry MRN: [id number] DOB: 04/23/1956 Today's Date: 05/02/2021   History of Present Illness Sheryl Henry  is a 65 y.o. female, with history of diabetes mellitus type 2 and hypertension presents the ED with a chief complaint of ringing in her head.  Patient reports that the day prior to presentation she was totally at her normal state of health.  The day prior to that she heard a high-volume sound in her head and felt like it made her whole head vibrate.  After that she had barely any sleep all night.  Symptoms seem to resolve and then the day of presentation it happened again.  Around lunchtime she had an intense feeling in her head.  She does not characterize it as pain, but cannot describe the sensation.  She laid down to take a nap at 2:00.  When she woke up she had the high-volume sound in her head again.  She reports that it happened so fast she cannot describe the characteristics of it.  He goes "through her head" but she cannot describe where it starts and finishes.  She says that the feeling that she has after that is like her brain needs a repeat.  It is an intense feeling and she does not know how to make it go away.  She knows for sure the soundwave brings it on.  The feeling that her brain needs to remove it is in both of her temples.  She again says that its not the pain.  Per patient, this soundwave is her main concern.  Son had told the ER that she has had this before and she has been off some of her psych medications.  They were planning to discharge her home with follow-up to psychiatry, patient then announced that she has not been able to walk all day.  She reports she feels like she is staggering.  She does not feel dizzy or like the room is spinning.  She has no changes in her vision.  She reports that she has to hold on from wall-to-wall to walk around the house today.  This is an acute change for her.  She has had no  dysphagia, no facial droop.  She does not endorse an asymmetric weakness.  She reports when she tries to walk forward she actually ends up walking backwards, but its not a problem with balance.  She almost fell yesterday, but was able to hold onto a wall.  She had a decrease in appetite, but no fever.  Patient does report that prior to this episode she was having abdominal pain that was periumbilical.  She was started on a PPI that seems to help.  Patient denies any dysuria hematuria.  She has had normal bowel movements.  Patient has no chest pain or palpitations.  Patient has no other complaints at this time.   Clinical Impression   Pt agreeable to OT/PT co-evaluation. Pt demonstrates need for min G assist for bed mobility and min to mod A for sit to stand with use of gait belt and RW. Pt able to short lateral steps to head of bed but was unable to fully march in place. Pt demonstrates general bilateral UE weakness as well. Pt reports PRN support only from family. Pt lives alone. Pt will benefit from continued OT in the hospital and recommended venue below to increase strength, balance, and endurance for safe ADL's.      Recommendations for follow up therapy  are one component of a multi-disciplinary discharge planning process, led by the attending physician.  Recommendations may be updated based on patient status, additional functional criteria and insurance authorization.   Follow Up Recommendations  Skilled nursing-short term rehab (<3 hours/day)    Assistance Recommended at Discharge Intermittent Supervision/Assistance  Functional Status Assessment  Patient has had a recent decline in their functional status and demonstrates the ability to make significant improvements in function in a reasonable and predictable amount of time.  Equipment Recommendations  None recommended by OT    Recommendations for Other Services       Precautions / Restrictions Precautions Precautions:  Fall Restrictions Weight Bearing Restrictions: No      Mobility Bed Mobility Overal bed mobility: Needs Assistance Bed Mobility: Supine to Sit;Sit to Supine     Supine to sit: Min guard Sit to supine: Min guard   General bed mobility comments: slow, labored    Transfers Overall transfer level: Needs assistance Equipment used: Rolling walker (2 wheels) Transfers: Sit to/from Stand Sit to Stand: Min assist           General transfer comment: patient requires cueing and assist for weakness, unable without RW      Balance Overall balance assessment: Needs assistance   Sitting balance-Leahy Scale: Good Sitting balance - Comments: seated EOB   Standing balance support: Bilateral upper extremity supported;Reliant on assistive device for balance Standing balance-Leahy Scale: Poor                             ADL either performed or assessed with clinical judgement   ADL Overall ADL's : Needs assistance/impaired                     Lower Body Dressing: Moderate assistance;Sitting/lateral leans Lower Body Dressing Details (indicate cue type and reason): Pt able to doff socks with assist to don them. Toilet Transfer: Minimal assistance;Moderate assistance;Stand-pivot;Rolling walker (2 wheels) Toilet Transfer Details (indicate cue type and reason): Partially simulated with RW via sit to stand from EOB         Functional mobility during ADLs: Minimal assistance;Moderate assistance;Rolling walker (2 wheels) (Lateral step to head of bed.)       Vision Baseline Vision/History: 1 Wears glasses (reading glasses) Ability to See in Adequate Light: 0 Adequate Patient Visual Report: No change from baseline (Pt reported blurred vision during ringing incident but this has resolved.) Vision Assessment?: No apparent visual deficits     Perception     Praxis      Pertinent Vitals/Pain Pain Assessment: No/denies pain     Hand Dominance Right    Extremity/Trunk Assessment Upper Extremity Assessment Upper Extremity Assessment: Generalized weakness   Lower Extremity Assessment Lower Extremity Assessment: Defer to PT evaluation   Cervical / Trunk Assessment Cervical / Trunk Assessment: Normal   Communication Communication Communication: No difficulties   Cognition Arousal/Alertness: Awake/alert Behavior During Therapy: WFL for tasks assessed/performed Overall Cognitive Status: Within Functional Limits for tasks assessed                                       General Comments       Exercises     Shoulder Instructions      Home Living Family/patient expects to be discharged to:: Private residence Living Arrangements: Alone Available Help at Discharge: Family;Available PRN/intermittently  Type of Home: Mobile home Home Access: Stairs to enter Entrance Stairs-Number of Steps: 4 Entrance Stairs-Rails: Right Home Layout: One level     Bathroom Shower/Tub: Producer, television/film/video: Handicapped height Bathroom Accessibility: Yes   Home Equipment: Hand held shower head;Grab bars - tub/shower          Prior Functioning/Environment Prior Level of Function : Needs assist;Independent/Modified Independent       Physical Assist : ADLs (physical)     Mobility Comments: states ambulation without AD ADLs Comments: mostly independent, family assists PRN        OT Problem List: Decreased strength;Decreased activity tolerance;Impaired balance (sitting and/or standing)      OT Treatment/Interventions: Self-care/ADL training;Therapeutic exercise;Energy conservation;DME and/or AE instruction;Therapeutic activities;Patient/family education;Balance training    OT Goals(Current goals can be found in the care plan section) Acute Rehab OT Goals Patient Stated Goal: Pt reporting [facility] OT Goal Formulation: With patient Time For Goal Achievement: 05/16/21 Potential to Achieve  Goals: Fair  OT Frequency: Min 2X/week   Barriers to D/C:            Co-evaluation PT/OT/SLP Co-Evaluation/Treatment: Yes Reason for Co-Treatment: To address functional/ADL transfers PT goals addressed during session: Mobility/safety with mobility;Balance;Proper use of DME;Strengthening/ROM OT goals addressed during session: ADL's and self-care      AM-PAC OT "6 Clicks" Daily Activity     Outcome Measure Help from another person eating meals?: None Help from another person taking care of personal grooming?: A Little Help from another person toileting, which includes using toliet, bedpan, or urinal?: A Lot Help from another person bathing (including washing, rinsing, drying)?: A Lot Help from another person to put on and taking off regular upper body clothing?: A Little Help from another person to put on and taking off regular lower body clothing?: A Lot 6 Click Score: 16   End of Session Equipment Utilized During Treatment: Gait belt;Rolling walker (2 wheels)  Activity Tolerance: Patient tolerated treatment well Patient left: in bed;with call bell/phone within reach  OT Visit Diagnosis: Unsteadiness on feet (R26.81);Other abnormalities of gait and mobility (R26.89);Muscle weakness (generalized) (M62.81);Ataxia, unspecified (R27.0)                Time: 8299-3716 OT Time Calculation (min): 25 min Charges:  OT General Charges $OT Visit: 1 Visit OT Evaluation $OT Eval Moderate Complexity: 1 Mod  Sowmya Partridge OT, MOT  Danie Chandler 05/02/2021, 12:32 PM

## 2021-05-02 NOTE — Progress Notes (Signed)
PROGRESS NOTE   Sheryl Henry  ZHY:[id number] DOB: 03-17-1956 DOA: 05/01/2021 PCP: Rebekah Chesterfield, NP   Chief Complaint  Patient presents with   Hallucinations    Auditory hallucination started after halloween.  "Felt sound-wave in head about one hour away.  Pt thinks neighbors are watching her with cameras in her house.     Level of care: Telemetry  Brief Admission History:  65 y.o. female, with history of diabetes mellitus type 2 and hypertension presents the ED with a chief complaint of ringing in her head.  Patient reports that the day prior to presentation she was totally at her normal state of health.  The day prior to that she heard a high-volume sound in her head and felt like it made her whole head vibrate.  After that she had barely any sleep all night.  Symptoms seem to resolve and then the day of presentation it happened again.  Around lunchtime she had an intense feeling in her head.  She does not characterize it as pain, but cannot describe the sensation.  She laid down to take a nap at 2:00.  When she woke up she had the high-volume sound in her head again.  She reports that it happened so fast she cannot describe the characteristics of it.  He goes "through her head" but she cannot describe where it starts and finishes.  She says that the feeling that she has after that is like her brain needs a repeat.  It is an intense feeling and she does not know how to make it go away.  She knows for sure the soundwave brings it on.  The feeling that her brain needs to remove it is in both of her temples.  She again says that its not the pain.  Per patient, this soundwave is her main concern.  Son had told the ER that she has had this before and she has been off some of her psych medications.  They were planning to discharge her home with follow-up to psychiatry, patient then announced that she has not been able to walk all day.  She reports she feels like she is staggering.  She does not  feel dizzy or like the room is spinning.   Her LE weakness worsened after receiving the flu and covid vaccines recently.    Assessment & Plan:   Active Problems:   Leg weakness, bilateral   Migraine headache with aura   Diabetes mellitus, type 2 (HCC)   Hypertension   Ataxia   Cutaneous amyloidosis (HCC)   Bilateral Leg weakness - difficult to discern as she has these symptoms chronically but became worse after receiving Covid and flu vaccine at the same time in each arm.  I discussed with neurology and GBS is on the differential.  Will follow up with neurology recommendations after additional testing has been completed.  Pt may need to have LP done as part of work up to rule out GBS.  MRI of brain T/L spine did not show findings that would cause her current symptoms.   Type 2 diabetes mellitus with neurological complications - uncontrolled as evidenced by an A1c of 7.3% - continue SSI coverage, prandial coverage and CBG monitoring  Essential hypertension  - resume home BP meds as CVA has been ruled out  AL Amyloidosis  - Pt followed by Dr. Marlaine Hind at [facility] at the oncology center - Pt has not followed up since Dec 2021    DVT prophylaxis:  heparin Code Status: full  Family Communication:  Disposition: TBD  Status is: Observation  The patient remains OBS appropriate and will d/c before 2 midnights.   Consultants:  neurology  Procedures:    Antimicrobials:  N/a   Subjective: Pt reports chronic weakness in legs but worse since flu/covid vaccine was given recently.  No loss of bowel or bladder control.   Objective: Vitals:   05/02/21 0641 05/02/21 0857 05/02/21 1007 05/02/21 1420  BP: (!) 184/88 (!) 175/79 (!) 169/95 (!) 168/68  Pulse: 94 87 88 80  Resp: 20 19  18   Temp: 99.7 F (37.6 C) 99.4 F (37.4 C)  99.1 F (37.3 C)  TempSrc: Oral Oral  Oral  SpO2: 97% 97% 97% 96%  Weight:      Height:        Intake/Output Summary (Last 24 hours) at 05/02/2021  1654 Last data filed at 05/02/2021 1624 Gross per 24 hour  Intake 340 ml  Output 300 ml  Net 40 ml   Filed Weights   05/01/21 1620 05/02/21 0222  Weight: 81.6 kg 106.9 kg    Examination:  General exam: Appears calm and comfortable  Respiratory system: Clear to auscultation. Respiratory effort normal. Cardiovascular system: normal S1 & S2 heard. No JVD, murmurs, rubs, gallops or clicks. No pedal edema. Gastrointestinal system: Abdomen is nondistended, soft and nontender. No organomegaly or masses felt. Normal bowel sounds heard. Central nervous system: Alert and oriented. No focal neurological deficits. Extremities: 4/5 strength RLE/3-4/5 LLE, reflexes weak both lower extremities.  Skin: No rashes, lesions or ulcers Psychiatry: Judgement and insight appear poor Mood & affect appropriate.   Data Reviewed: I have personally reviewed following labs and imaging studies  CBC: Recent Labs  Lab 05/01/21 1722  WBC 8.4  NEUTROABS 5.8  HGB 16.6*  HCT 46.0  MCV 88.3  PLT 150    Basic Metabolic Panel: Recent Labs  Lab 05/01/21 1722  NA 139  K 4.0  CL 102  CO2 28  GLUCOSE 206*  BUN 15  CREATININE 0.63  CALCIUM 8.8*  MG 2.1    GFR: Estimated Creatinine Clearance: 89.4 mL/min (by C-G formula based on SCr of 0.63 mg/dL).  Liver Function Tests: Recent Labs  Lab 05/01/21 1722  AST 34  ALT 28  ALKPHOS 84  BILITOT 0.8  PROT 7.9  ALBUMIN 4.2    CBG: Recent Labs  Lab 05/02/21 1617  GLUCAP 272*    Recent Results (from the past 240 hour(s))  Resp Panel by RT-PCR (Flu A&B, Covid) Urine, Clean Catch     Status: None   Collection Time: 05/02/21 12:06 AM   Specimen: Urine, Clean Catch; Nasopharyngeal(NP) swabs in vial transport medium  Result Value Ref Range Status   SARS Coronavirus 2 by RT PCR NEGATIVE NEGATIVE Final    Comment: (NOTE) SARS-CoV-2 target nucleic acids are NOT DETECTED.  The SARS-CoV-2 RNA is generally detectable in upper respiratory specimens  during the acute phase of infection. The lowest concentration of SARS-CoV-2 viral copies this assay can detect is 138 copies/mL. A negative result does not preclude SARS-Cov-2 infection and should not be used as the sole basis for treatment or other patient management decisions. A negative result may occur with  improper specimen collection/handling, submission of specimen other than nasopharyngeal swab, presence of viral mutation(s) within the areas targeted by this assay, and inadequate number of viral copies(<138 copies/mL). A negative result must be combined with clinical observations, patient history, and epidemiological information. The expected result  is Negative.  Fact Sheet for Patients:  BloggerCourse.com  Fact Sheet for Healthcare Providers:  SeriousBroker.it  This test is no t yet approved or cleared by the [place] FDA and  has been authorized for detection and/or diagnosis of SARS-CoV-2 by FDA under an Emergency Use Authorization (EUA). This EUA will remain  in effect (meaning this test can be used) for the duration of the COVID-19 declaration under Section 564(b)(1) of the Act, 21 U.S.C.section 360bbb-3(b)(1), unless the authorization is terminated  or revoked sooner.       Influenza A by PCR NEGATIVE NEGATIVE Final   Influenza B by PCR NEGATIVE NEGATIVE Final    Comment: (NOTE) The Xpert Xpress SARS-CoV-2/FLU/RSV plus assay is intended as an aid in the diagnosis of influenza from Nasopharyngeal swab specimens and should not be used as a sole basis for treatment. Nasal washings and aspirates are unacceptable for Xpert Xpress SARS-CoV-2/FLU/RSV testing.  Fact Sheet for Patients: BloggerCourse.com  Fact Sheet for Healthcare Providers: SeriousBroker.it  This test is not yet approved or cleared by the [place] FDA and has been authorized for detection  and/or diagnosis of SARS-CoV-2 by FDA under an Emergency Use Authorization (EUA). This EUA will remain in effect (meaning this test can be used) for the duration of the COVID-19 declaration under Section 564(b)(1) of the Act, 21 U.S.C. section 360bbb-3(b)(1), unless the authorization is terminated or revoked.  Performed at [facility], [address], [place], [place] [address]      Radiology Studies: CT HEAD WO CONTRAST ( )  Result Date: 05/01/2021 CLINICAL DATA:  Headache.  Auditory hallucinations. EXAM: CT HEAD WITHOUT CONTRAST TECHNIQUE: Contiguous axial images were obtained from the base of the skull through the vertex without intravenous contrast. COMPARISON:  None. FINDINGS: Brain: No evidence of acute infarction, hemorrhage, hydrocephalus, extra-axial collection or mass lesion/mass effect. Vascular: Negative for hyperdense vessel Skull: Negative Sinuses/Orbits: Negative Other: None IMPRESSION: Negative CT head Electronically Signed   By: Marlan Palau M.D.   On: 05/01/2021 17:46   MR ANGIO HEAD WO CONTRAST  Result Date: 05/02/2021 CLINICAL DATA:  Headache, new or worsening (Age >= 50y) EXAM: MR VENOGRAM HEAD WITHOUT CONTRAST MR ANGIOGRAPHY HEAD WITHOUT CONTRAST TECHNIQUE: Angiographic images of the intracranial venous structures were acquired using MRV technique without intravenous contrast. Angiographic images of the Circle of Willis were acquired using MRA technique without intravenous contrast. COMPARISON:  No pertinent prior exam. FINDINGS: MRA head: Intracranial internal carotid arteries are patent. There is a 1.5 mm inferiorly directed outpouching from the distal supraclinoid right ICA. There is likely a small vessel arising from this. Included anterior and middle cerebral arteries are patent. Intracranial vertebral arteries are patent. Basilar artery is patent. Major cerebellar artery origins are patent. Posterior cerebral arteries are patent. MRV head: Superior sagittal sinus,  straight sinus, vein of Galen, internal cerebral veins, and basal veins are patent. Both transverse and sigmoid sinuses are patent. Right transverse sinus is dominant. IMPRESSION: No proximal intracranial vessel occlusion. No evidence of dural sinus thrombosis. 1.5 mm outpouching from distal supraclinoid right ICA likely represents an infundibulum rather than aneurysm. Electronically Signed   By: Guadlupe Spanish M.D.   On: 05/02/2021 16:15   MR BRAIN WO CONTRAST  Result Date: 05/02/2021 CLINICAL DATA:  Neuro deficit, acute, stroke suspected EXAM: MRI HEAD WITHOUT CONTRAST TECHNIQUE: Multiplanar, multiecho pulse sequences of the brain and surrounding structures were obtained without intravenous contrast. COMPARISON:  None. FINDINGS: Brain: No acute infarction, hemorrhage, hydrocephalus, extra-axial collection or mass lesion.  Mild for age scattered T2 hyperintensities in the supratentorial and pontine white matter, nonspecific but compatible with chronic microvascular disease. Mild atrophy. Vascular: Major arterial flow voids are maintained at the skull base. Skull and upper cervical spine: Normal marrow signal. Sinuses/Orbits: Clear sinuses.  Unremarkable orbits. Other: No mastoid effusions. IMPRESSION: 1. No evidence of acute intracranial abnormality, including acute infarct. 2. 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 Electronically Signed   By: Feliberto Harts M.D.   On: 05/02/2021 11:34   MR THORACIC SPINE WO CONTRAST  Result Date: 05/02/2021 CLINICAL DATA:  Ataxia, nontraumatic, T-spine pathology suspected; Myelopathy, acute or progressive Low back pain, cauda equina syndrome suspected EXAM: MRI THORACIC AND LUMBAR SPINE WITHOUT CONTRAST TECHNIQUE: Multiplanar and multiecho pulse sequences of the thoracic and lumbar spine were obtained without intravenous contrast. COMPARISON:  None. FINDINGS: MRI THORACIC SPINE FINDINGS Mildly motion limited study. Alignment: No substantial sagittal  subluxation. Mild kyphosis at T10-T11. Vertebrae: Vertebral body heights are maintained. No focal marrow edema to suggest acute fracture or discitis/osteomyelitis. Heterogeneous bone marrow without suspicious bone lesion. Cord: Mildly motion limited evaluation without evidence of abnormal cord signal or abnormal cord morphology. Paraspinal and other soft tissues: Unremarkable. Disc levels: Small disc bulge at T10-T11 with ligamentum flavum thickening and facet hypertrophy at this level. Resulting mild canal stenosis. Otherwise, no significant canal stenosis. Multilevel facet arthropathy moderate foraminal stenosis on the right at T1-T2 and the left at T8-T9. Otherwise, multilevel mild-to-moderate foraminal stenosis. MRI LUMBAR SPINE FINDINGS Segmentation:  Standard. Alignment:  No substantial sagittal subluxation. Vertebrae: Vertebral body heights are maintained. Degenerative/discogenic endplate signal changes about the right eccentric L2-L3 disc. Otherwise, no focal marrow edema to suggest acute fracture discitis/osteomyelitis. Heterogeneous bone marrow without suspicious bone lesion. Conus medullaris and cauda equina: Conus extends to the T12-L1 level. Conus appears normal. Paraspinal and other soft tissues: Unremarkable. Disc levels: T12-L1: Mild disc bulging without significant stenosis. L1-L2: Mild disc bulging and bilateral facet arthropathy without significant stenosis. L2-L3: Left eccentric disc height loss. Left eccentric disc bulging and endplate spurring. Bilateral facet arthropathy. Resulting mild to moderate canal and left greater than right subarticular recess stenosis. Mild-to-moderate left foraminal stenosis with left far lateral disc closely approximating the exiting left L2 nerve. L3-L4: Broad disc bulge with ligamentum flavum thickening and right greater than left facet arthropathy. Resulting moderate canal stenosis with moderate to severe right subarticular recess and foraminal stenosis. Mild  left foraminal stenosis. L4-L5: Broad disc bulge with ligamentum flavum thickening and bilateral facet arthropathy. Resulting mild to moderate canal and bilateral subarticular recess stenosis. Mild bilateral foraminal stenosis. L5-S1: Disc bulging with bilateral facet arthropathy. No significant canal or foraminal stenosis. IMPRESSION: MR THORACIC SPINE IMPRESSION 1. Moderate foraminal stenosis on the right at T1-T2 and the left at T8-T9. Otherwise, multilevel mild-to-moderate foraminal stenosis. 2. Mild canal stenosis at T10-T11. MR LUMBAR SPINE IMPRESSION 1. At L3-L4, moderate to severe right subarticular recess and foraminal stenosis with moderate canal stenosis. 2. At the L2-L3, mild-to-moderate canal and left greater than right subarticular recess stenosis with mild-to-moderate left foraminal stenosis and left far lateral disc closely approximating the exiting left L2 nerve. 3. At L4-L5, mild-to-moderate canal and bilateral subarticular recess stenosis with mild bilateral foraminal stenosis. Electronically Signed   By: Feliberto Harts M.D.   On: 05/02/2021 12:03   MR LUMBAR SPINE WO CONTRAST  Result Date: 05/02/2021 CLINICAL DATA:  Ataxia, nontraumatic, T-spine pathology suspected; Myelopathy, acute or progressive Low back pain, cauda equina syndrome suspected EXAM: MRI THORACIC AND  LUMBAR SPINE WITHOUT CONTRAST TECHNIQUE: Multiplanar and multiecho pulse sequences of the thoracic and lumbar spine were obtained without intravenous contrast. COMPARISON:  None. FINDINGS: MRI THORACIC SPINE FINDINGS Mildly motion limited study. Alignment: No substantial sagittal subluxation. Mild kyphosis at T10-T11. Vertebrae: Vertebral body heights are maintained. No focal marrow edema to suggest acute fracture or discitis/osteomyelitis. Heterogeneous bone marrow without suspicious bone lesion. Cord: Mildly motion limited evaluation without evidence of abnormal cord signal or abnormal cord morphology. Paraspinal and other  soft tissues: Unremarkable. Disc levels: Small disc bulge at T10-T11 with ligamentum flavum thickening and facet hypertrophy at this level. Resulting mild canal stenosis. Otherwise, no significant canal stenosis. Multilevel facet arthropathy moderate foraminal stenosis on the right at T1-T2 and the left at T8-T9. Otherwise, multilevel mild-to-moderate foraminal stenosis. MRI LUMBAR SPINE FINDINGS Segmentation:  Standard. Alignment:  No substantial sagittal subluxation. Vertebrae: Vertebral body heights are maintained. Degenerative/discogenic endplate signal changes about the right eccentric L2-L3 disc. Otherwise, no focal marrow edema to suggest acute fracture discitis/osteomyelitis. Heterogeneous bone marrow without suspicious bone lesion. Conus medullaris and cauda equina: Conus extends to the T12-L1 level. Conus appears normal. Paraspinal and other soft tissues: Unremarkable. Disc levels: T12-L1: Mild disc bulging without significant stenosis. L1-L2: Mild disc bulging and bilateral facet arthropathy without significant stenosis. L2-L3: Left eccentric disc height loss. Left eccentric disc bulging and endplate spurring. Bilateral facet arthropathy. Resulting mild to moderate canal and left greater than right subarticular recess stenosis. Mild-to-moderate left foraminal stenosis with left far lateral disc closely approximating the exiting left L2 nerve. L3-L4: Broad disc bulge with ligamentum flavum thickening and right greater than left facet arthropathy. Resulting moderate canal stenosis with moderate to severe right subarticular recess and foraminal stenosis. Mild left foraminal stenosis. L4-L5: Broad disc bulge with ligamentum flavum thickening and bilateral facet arthropathy. Resulting mild to moderate canal and bilateral subarticular recess stenosis. Mild bilateral foraminal stenosis. L5-S1: Disc bulging with bilateral facet arthropathy. No significant canal or foraminal stenosis. IMPRESSION: MR THORACIC SPINE  IMPRESSION 1. Moderate foraminal stenosis on the right at T1-T2 and the left at T8-T9. Otherwise, multilevel mild-to-moderate foraminal stenosis. 2. Mild canal stenosis at T10-T11. MR LUMBAR SPINE IMPRESSION 1. At L3-L4, moderate to severe right subarticular recess and foraminal stenosis with moderate canal stenosis. 2. At the L2-L3, mild-to-moderate canal and left greater than right subarticular recess stenosis with mild-to-moderate left foraminal stenosis and left far lateral disc closely approximating the exiting left L2 nerve. 3. At L4-L5, mild-to-moderate canal and bilateral subarticular recess stenosis with mild bilateral foraminal stenosis. Electronically Signed   By: Feliberto Harts M.D.   On: 05/02/2021 12:03   MR MRV HEAD WO CM  Result Date: 05/02/2021 CLINICAL DATA:  Headache, new or worsening (Age >= 50y) EXAM: MR VENOGRAM HEAD WITHOUT CONTRAST MR ANGIOGRAPHY HEAD WITHOUT CONTRAST TECHNIQUE: Angiographic images of the intracranial venous structures were acquired using MRV technique without intravenous contrast. Angiographic images of the Circle of Willis were acquired using MRA technique without intravenous contrast. COMPARISON:  No pertinent prior exam. FINDINGS: MRA head: Intracranial internal carotid arteries are patent. There is a 1.5 mm inferiorly directed outpouching from the distal supraclinoid right ICA. There is likely a small vessel arising from this. Included anterior and middle cerebral arteries are patent. Intracranial vertebral arteries are patent. Basilar artery is patent. Major cerebellar artery origins are patent. Posterior cerebral arteries are patent. MRV head: Superior sagittal sinus, straight sinus, vein of Galen, internal cerebral veins, and basal veins are patent. Both transverse and sigmoid sinuses  are patent. Right transverse sinus is dominant. IMPRESSION: No proximal intracranial vessel occlusion. No evidence of dural sinus thrombosis. 1.5 mm outpouching from distal  supraclinoid right ICA likely represents an infundibulum rather than aneurysm. Electronically Signed   By: Guadlupe Spanish M.D.   On: 05/02/2021 16:15   ECHOCARDIOGRAM COMPLETE  Result Date: 05/02/2021    ECHOCARDIOGRAM REPORT   Patient Name:   Sheryl Henry Date of Exam: 05/02/2021 Medical Rec #:  [id number]        Height:       67.0 in Accession #:    [id number]       Weight:       235.7 lb Date of Birth:  1956/05/05        BSA:          2.168 m Patient Age:    65 years         BP:           175/79 mmHg Patient Gender: F                HR:           87 bpm. [name] Procedure: 2D Echo, Cardiac Doppler and Color Doppler Indications:    CVA  History:        Patient has no prior history of Echocardiogram examinations.                 Risk Factors:Diabetes, Hypertension and Obesity.  Sonographer:    Lavenia Atlas RDCS Referring Phys: [id number] ASIA B ZIERLE-GHOSH  Sonographer Comments: Technically challenging study due to limited acoustic windows and patient is morbidly obese. IMPRESSIONS  1. Left ventricular ejection fraction, by estimation, is 60 to 65%. The left ventricle has normal function. The left ventricle has no regional wall motion abnormalities. There is moderate left ventricular hypertrophy. Left ventricular diastolic parameters are consistent with Grade I diastolic dysfunction (impaired relaxation).  2. Right ventricular systolic function is normal. The right ventricular size is normal.  3. The mitral valve is normal in structure. No evidence of mitral valve regurgitation. No evidence of mitral stenosis.  4. The aortic valve is tricuspid. Aortic valve regurgitation is not visualized. No aortic stenosis is present. FINDINGS  Left Ventricle: Left ventricular ejection fraction, by estimation, is 60 to 65%. The left ventricle has normal function. The left ventricle has no regional wall motion abnormalities. The left ventricular internal cavity size was normal in size. There is   moderate left ventricular hypertrophy. Left ventricular diastolic parameters are consistent with Grade I diastolic dysfunction (impaired relaxation). Normal left ventricular filling pressure. Right Ventricle: The right ventricular size is normal. No increase in right ventricular wall thickness. Right ventricular systolic function is normal. Left Atrium: Left atrial size was normal in size. Right Atrium: Right atrial size was normal in size. Pericardium: There is no evidence of pericardial effusion. Mitral Valve: The mitral valve is normal in structure. No evidence of mitral valve regurgitation. No evidence of mitral valve stenosis. Tricuspid Valve: The tricuspid valve is normal in structure. Tricuspid valve regurgitation is not demonstrated. No evidence of tricuspid stenosis. Aortic Valve: The aortic valve is tricuspid. Aortic valve regurgitation is not visualized. No aortic stenosis is present. Aortic valve mean gradient measures 2.6 mmHg. Aortic valve peak gradient measures 4.3 mmHg. Aortic valve area, by VTI measures 2.39 cm. Pulmonic Valve: The pulmonic valve was not well visualized. Pulmonic valve regurgitation is mild. No evidence of pulmonic stenosis. Aorta: The aortic  root is normal in size and structure. Venous: The inferior vena cava was not well visualized. IAS/Shunts: No atrial level shunt detected by color flow Doppler.  LEFT VENTRICLE PLAX 2D LVIDd:         3.60 cm     Diastology LVIDs:         2.60 cm     LV e' medial:    5.33 cm/s LV PW:         1.30 cm     LV E/e' medial:  11.6 LV IVS:        1.30 cm     LV e' lateral:   5.44 cm/s LVOT diam:     2.10 cm     LV E/e' lateral: 11.3 LV SV:         53 LV SV Index:   24 LVOT Area:     3.46 cm  LV Volumes (MOD) LV vol d, MOD A4C: 80.6 ml LV vol s, MOD A4C: 32.3 ml LV SV MOD A4C:     80.6 ml RIGHT VENTRICLE RV Basal diam:  1.70 cm RV S prime:     15.20 cm/s TAPSE (M-mode): 1.9 cm LEFT ATRIUM             Index        RIGHT ATRIUM           Index LA diam:         3.30 cm 1.52 cm/m   RA Area:     10.80 cm LA Vol (A2C):   42.5 ml 19.60 ml/m  RA Volume:   23.00 ml  10.61 ml/m LA Vol (A4C):   49.1 ml 22.64 ml/m LA Biplane Vol: 48.5 ml 22.37 ml/m  AORTIC VALVE AV Area (Vmax):    2.70 cm AV Area (Vmean):   2.54 cm AV Area (VTI):     2.39 cm AV Vmax:           103.76 cm/s AV Vmean:          76.637 cm/s AV VTI:            0.221 m AV Peak Grad:      4.3 mmHg AV Mean Grad:      2.6 mmHg LVOT Vmax:         80.90 cm/s LVOT Vmean:        56.200 cm/s LVOT VTI:          0.153 m LVOT/AV VTI ratio: 0.69  AORTA Ao Root diam: 3.10 cm MITRAL VALVE MV Area (PHT): 7.51 cm    SHUNTS MV Decel Time: 101 msec    Systemic VTI:  0.15 m MV E velocity: 61.70 cm/s  Systemic Diam: 2.10 cm MV A velocity: 98.50 cm/s MV E/A ratio:  0.63 Dina Rich MD Electronically signed by Dina Rich MD Signature Date/Time: 05/02/2021/10:13:13 AM    Final     Scheduled Meds:  dicyclomine  20 mg Oral TID AC & HS   heparin  5,000 Units Subcutaneous Q8H   insulin aspart  0-15 Units Subcutaneous TID WC   insulin aspart  0-5 Units Subcutaneous QHS   [START ON 05/03/2021] levothyroxine  50 mcg Oral Q0600   risperiDONE  1 mg Oral BID   trandolapril  2 mg Oral Daily   venlafaxine XR  75 mg Oral Daily   Continuous Infusions:  magnesium sulfate bolus IVPB 2 g (05/02/21 1624)     LOS: 0 days   Time spent: 55 mins  Standley Dakins, MD How to contact the [facility] Attending or Consulting provider 7A - 7P or covering provider during after hours 7P -7A, for this patient?  Check the care team in [facility] and look for a) attending/consulting TRH provider listed and b) the [facility] team listed Log into [email or web address] and use [place]'s universal password to access. If you do not have the password, please contact the hospital operator. Locate the [facility] provider you are looking for under [facility] and page to a number that you can be directly reached. If you still have difficulty reaching the  provider, please page the [facility] (Director on Call) for the Hospitalists listed on amion for assistance.  05/02/2021, 4:54 PM

## 2021-05-02 NOTE — Progress Notes (Signed)
Patient blood obtained after several attempts, hard stick. Patient is more alert, remembers the earlier part of the day. Patient is now moving to radiology for a lumbar puncture. She has a transfer order for SDU.

## 2021-05-02 NOTE — Evaluation (Signed)
Speech Language Pathology Evaluation Patient Details Name: Sheryl Henry MRN: [id number] DOB: 01/12/1956 Today's Date: 05/02/2021 Time: 4196-2229 SLP Time Calculation (min) (ACUTE ONLY): 35 min  Problem List:  Patient Active Problem List   Diagnosis Date Noted   CVA (cerebral vascular accident) (HCC) 05/02/2021   Diabetes mellitus, type 2 (HCC) 05/02/2021   Hypertension 05/02/2021   Past Medical History:  Past Medical History:  Diagnosis Date   Cutaneous amyloidosis (HCC)    Diabetes mellitus without complication (HCC)    Hypertension    Past Surgical History:  Past Surgical History:  Procedure Laterality Date   BACK SURGERY     BREAST SURGERY     MANDIBLE SURGERY     HPI:  65 y.o. female, with history of diabetes mellitus type 2 and hypertension presents the ED with a chief complaint of ringing in her head.  Patient reports that the day prior to presentation she was totally at her normal state of health.  The day prior to that she heard a high-volume sound in her head and felt like it made her whole head vibrate.starts and finishes.  She says that the feeling that she has after that is like her brain needs a repeat.  Son had told the ER that she has had this before and she has been off some of her psych medications.  They were planning to discharge her home with follow-up to psychiatry, patient then announced that she has not been able to walk all day.  She reports she feels like she is staggering.  She does not feel dizzy or like the room is spinning. MRI on 05/02/21 negative; SLE generated to assess cognitive changes.   Assessment / Plan / Recommendation Clinical Impression  Pt administered the SLUMS ([employer] [place] [employer] Mental Status Examination) with a score of 28/30 with deficits only noted with recall of 1/5 words (requiring category cue for one); others recalled independently,   and repeating digits backwards with 4 digits as pt transposed the last two digits.  Pt reports  baseline dyslexia and this happens as a result of this intermittently.  Speech 100% intelligible within complex conversation.  Pt with adequate verbal expression/auditory comprehension skills.  Primary concern is "buzzing in her head" as reported in case hx.  No f/u recommended with ST in acute setting.  Thank you for this consult.    SLP Assessment  SLP Recommendation/Assessment: Patient does not need any further Speech Lanaguage Pathology Services SLP Visit Diagnosis: Cognitive communication deficit (R41.841)    Recommendations for follow up therapy are one component of a multi-disciplinary discharge planning process, led by the attending physician.  Recommendations may be updated based on patient status, additional functional criteria and insurance authorization.    Follow Up Recommendations  No SLP follow up    Assistance Recommended at Discharge  Intermittent Supervision/Assistance  Functional Status Assessment    Frequency and Duration           SLP Evaluation Cognition  Overall Cognitive Status: Within Functional Limits for tasks assessed Arousal/Alertness: Awake/alert Orientation Level: Oriented X4 Year: 2022 Month: November Day of Week: Correct Attention: Sustained Sustained Attention: Appears intact Memory: Appears intact Immediate Memory Recall: Sock;Blue;Bed Memory Recall Sock: Without Cue Memory Recall Blue: Without Cue Memory Recall Bed: Without Cue Awareness: Appears intact Problem Solving: Appears intact Safety/Judgment: Appears intact       Comprehension  Auditory Comprehension Overall Auditory Comprehension: Appears within functional limits for tasks assessed Yes/No Questions: Within Functional Limits Commands: Within Functional  Limits Conversation: Administrator Discrimination: Not tested Reading Comprehension Reading Status: Not tested    Expression Expression Primary Mode of Expression: Verbal Verbal  Expression Overall Verbal Expression: Appears within functional limits for tasks assessed Level of Generative/Spontaneous Verbalization: Conversation Repetition: No impairment Naming: No impairment Non-Verbal Means of Communication: Not applicable Written Expression Dominant Hand: Right Written Expression: Within Functional Limits   Oral / Motor  Oral Motor/Sensory Function Overall Oral Motor/Sensory Function: Within functional limits Motor Speech Overall Motor Speech: Appears within functional limits for tasks assessed Respiration: Within functional limits Phonation: Normal Resonance: Within functional limits Articulation: Within functional limitis Intelligibility: Intelligible Motor Planning: Witnin functional limits Motor Speech Errors: Not applicable                       Tressie Stalker, M.S., CCC-SLP 05/02/2021, 1:15 PM

## 2021-05-02 NOTE — Progress Notes (Signed)
05/02/2021 5:37 PM  Rapid Response  I arrived for rapid response and found patient had a syncopal event while on commode having a bowel movement.  Pt became diaphoretic,  hypotensive and tachycardic.  She was taken to bed and became responsive.  BP was initially hypotensive but responded to IV fluid bolus.  Neuro exam with poor lower extremity reflexes but 4/5 strength in legs and sensation intact.  Discussed with Dr. Melynda Ripple and with our concern for GBS I called and spoke with Dr. Tyron Russell and requested a lumbar puncture.  Dr. Melynda Ripple ordered a STAT IgA test and if normal would start IVIG therapy.   Transfer to stepdown ICU as she has had low BPs and could start IVIG soon.  Dr Melynda Ripple ordered tests requested for LP.  Follow up IgA test.    Critical care time 55 mins   Maryln Manuel, MD  How to contact the [facility] Attending or Consulting provider 7A - 7P or covering provider during after hours 7P -7A, for this patient?  Check the care team in [facility] and look for a) attending/consulting TRH provider listed and b) the [facility] team listed Log into [email or web address] and use [place]'s universal password to access. If you do not have the password, please contact the hospital operator. Locate the [facility] provider you are looking for under [facility] and page to a number that you can be directly reached. If you still have difficulty reaching the provider, please page the [facility] (Director on Call) for the Hospitalists listed on amion for assistance.

## 2021-05-02 NOTE — ED Notes (Signed)
Pt placed in gown.

## 2021-05-02 NOTE — H&P (Signed)
TRH H&P    Patient Demographics:    Sheryl Henry, is a 65 y.o. female  MRN: [id number]  DOB - 1956/01/31  Admit Date - 05/01/2021  Referring MD/NP/PA: Sabra Heck  Outpatient Primary MD for the patient is [name], NP  Patient coming from: Home  Chief complaint- Ringing in head   HPI:    Sheryl Henry  is a 65 y.o. female, with history of diabetes mellitus type 2 and hypertension presents the ED with a chief complaint of ringing in her head.  Patient reports that the day prior to presentation she was totally at her normal state of health.  The day prior to that she heard a high-volume sound in her head and felt like it made her whole head vibrate.  After that she had barely any sleep all night.  Symptoms seem to resolve and then the day of presentation it happened again.  Around lunchtime she had an intense feeling in her head.  She does not characterize it as pain, but cannot describe the sensation.  She laid down to take a nap at 2:00.  When she woke up she had the high-volume sound in her head again.  She reports that it happened so fast she cannot describe the characteristics of it.  He goes "through her head" but she cannot describe where it starts and finishes.  She says that the feeling that she has after that is like her brain needs a repeat.  It is an intense feeling and she does not know how to make it go away.  She knows for sure the soundwave brings it on.  The feeling that her brain needs to remove it is in both of her temples.  She again says that its not the pain.  Per patient, this soundwave is her main concern.  Son had told the ER that she has had this before and she has been off some of her psych medications.  They were planning to discharge her home with follow-up to psychiatry, patient then announced that she has not been able to walk all day.  She reports she feels like she is staggering.   She does not feel dizzy or like the room is spinning.  She has no changes in her vision.  She reports that she has to hold on from wall-to-wall to walk around the house today.  This is an acute change for her.  She has had no dysphagia, no facial droop.  She does not endorse an asymmetric weakness.  She reports when she tries to walk forward she actually ends up walking backwards, but its not a problem with balance.  She almost fell yesterday, but was able to hold onto a wall.  She had a decrease in appetite, but no fever.  Patient does report that prior to this episode she was having abdominal pain that was periumbilical.  She was started on a PPI that seems to help.  Patient denies any dysuria hematuria.  She has had normal bowel movements.  Patient has no chest pain  or palpitations.  Patient has no other complaints at this time.  Patient does not smoke, does not drink alcohol, does not use illicit drugs.  She is vaccinated for COVID.  Patient is full code.  Patient is concerned that her son is too stressed out about her being here so she would like to include another family contact if needed.  It is her aunt Lucendia Herrlich - [phone]  In the ED Temp 98, heart rate 79-102, respiratory rate 15-20, blood pressure 161/86, satting at 95% No leukocytosis 8.4, hemoglobin 16.6, platelets 150 Chemistry panel is unremarkable aside from hyperglycemia 296 CT head is negative for acute changes EKG shows a heart rate of 81, sinus rhythm, QTC 458 Admission requested for ataxia/stroke work-up     Review of systems:    In addition to the HPI above,  No Fever-chills, No changes in vision No problems swallowing food or Liquids, No Chest pain, Cough or Shortness of Breath, No associated abdominal pain, no Nausea or Vomiting, bowel movements are regular, No Blood in stool or Urine, No dysuria, No new skin rashes or bruises, No new joints pains-aches,  No new weakness, tingling, numbness in any extremity, No  recent weight gain or loss, No polyuria, polydypsia or polyphagia, No significant Mental Stressors.  All other systems reviewed and are negative.    Past History of the following :    Past Medical History:  Diagnosis Date   Diabetes mellitus without complication (HCC)    Hypertension       Past Surgical History:  Procedure Laterality Date   BACK SURGERY     BREAST SURGERY     MANDIBLE SURGERY        Social History:      Social History   Tobacco Use   Smoking status: Never   Smokeless tobacco: Never  Substance Use Topics   Alcohol use: Not Currently       Family History :    History reviewed. No pertinent family history. Family history of hypertension, diabetes mellitus type 2   Home Medications:   Prior to Admission medications   Not on File     Allergies:     Allergies  Allergen Reactions   Ivp Dye [Iodinated Diagnostic Agents] Swelling     Physical Exam:   Vitals  Blood pressure (!) 175/92, pulse 88, temperature 98.9 F (37.2 C), resp. rate 19, height 5\' 7"  (1.702 m), weight 106.9 kg, SpO2 96 %.   1.  General: Patient lying supine in bed,  no acute distress   2. Psychiatric: Alert and oriented x 3, mood and behavior normal for situation, pleasant and cooperative with exam   3. Neurologic: Speech and language are normal, face is symmetric, moves all 4 extremities voluntarily, finger-to-nose test is slow, but she is able to complete it correctly, equal strength in the extremities  4. HEENMT:  Head is atraumatic, normocephalic, pupils reactive to light, neck is supple, trachea is midline, mucous membranes are moist   5. Respiratory : Lungs are clear to auscultation bilaterally without wheezing, rhonchi, rales, no cyanosis, no increase in work of breathing or accessory muscle use   6. Cardiovascular : Heart rate normal, rhythm is regular, no murmurs, rubs or gallops, no peripheral edema, peripheral pulses palpated   7. Gastrointestinal:   Abdomen is soft, nondistended, nontender to palpation bowel sounds active, no masses or organomegaly palpated   8. Skin:  Skin is warm, dry and intact without rashes, acute lesions, or ulcers on limited  exam   9.Musculoskeletal:  No acute deformities or trauma, no asymmetry in tone, no peripheral edema, peripheral pulses palpated, no tenderness to palpation in the extremities     Data Review:    CBC Recent Labs  Lab 05/01/21 1722  WBC 8.4  HGB 16.6*  HCT 46.0  PLT 150  MCV 88.3  MCH 31.9  MCHC 36.1*  RDW 12.9  LYMPHSABS 1.8  MONOABS 0.6  EOSABS 0.1  BASOSABS 0.0   ------------------------------------------------------------------------------------------------------------------  Results for orders placed or performed during the hospital encounter of 05/01/21 (from the past 48 hour(s))  Comprehensive metabolic panel     Status: Abnormal   Collection Time: 05/01/21  5:22 PM  Result Value Ref Range   Sodium 139 135 - 145 mmol/L   Potassium 4.0 3.5 - 5.1 mmol/L   Chloride 102 98 - 111 mmol/L   CO2 28 22 - 32 mmol/L   Glucose, Bld 206 (H) 70 - 99 mg/dL    Comment: Glucose reference range applies only to samples taken after fasting for at least 8 hours.   BUN 15 8 - 23 mg/dL   Creatinine, Ser 0.63 0.44 - 1.00 mg/dL   Calcium 8.8 (L) 8.9 - 10.3 mg/dL   Total Protein 7.9 6.5 - 8.1 g/dL   Albumin 4.2 3.5 - 5.0 g/dL   AST 34 15 - 41 U/L   ALT 28 0 - 44 U/L   Alkaline Phosphatase 84 38 - 126 U/L   Total Bilirubin 0.8 0.3 - 1.2 mg/dL   GFR, Estimated >60 >60 mL/min    Comment: (NOTE) Calculated using the CKD-EPI Creatinine Equation (2021)    Anion gap 9 5 - 15    Comment: Performed at [facility], [address], [place], [place] [address]  CBC with Differential     Status: Abnormal   Collection Time: 05/01/21  5:22 PM  Result Value Ref Range   WBC 8.4 4.0 - 10.5 K/uL   RBC 5.21 (H) 3.87 - 5.11 MIL/uL   Hemoglobin 16.6 (H) 12.0 - 15.0 g/dL   HCT 46.0 36.0 - 46.0 %    MCV 88.3 80.0 - 100.0 fL   MCH 31.9 26.0 - 34.0 pg   MCHC 36.1 (H) 30.0 - 36.0 g/dL   RDW 12.9 11.5 - 15.5 %   Platelets 150 150 - 400 K/uL   nRBC 0.0 0.0 - 0.2 %   Neutrophils Relative % 69 %   Neutro Abs 5.8 1.7 - 7.7 K/uL   Lymphocytes Relative 21 %   Lymphs Abs 1.8 0.7 - 4.0 K/uL   Monocytes Relative 7 %   Monocytes Absolute 0.6 0.1 - 1.0 K/uL   Eosinophils Relative 1 %   Eosinophils Absolute 0.1 0.0 - 0.5 K/uL   Basophils Relative 1 %   Basophils Absolute 0.0 0.0 - 0.1 K/uL   Immature Granulocytes 1 %   Abs Immature Granulocytes 0.04 0.00 - 0.07 K/uL    Comment: Performed at [facility], [address], [place], [place] [address]  Magnesium     Status: None   Collection Time: 05/01/21  5:22 PM  Result Value Ref Range   Magnesium 2.1 1.7 - 2.4 mg/dL    Comment: Performed at [facility], [address], [place],  [address]  Resp Panel by RT-PCR (Flu A&B, Covid) Urine, Clean Catch     Status: None   Collection Time: 05/02/21 12:06 AM   Specimen: Urine, Clean Catch; Nasopharyngeal(NP) swabs in vial transport medium  Result  Value Ref Range   SARS Coronavirus 2 by RT PCR NEGATIVE NEGATIVE    Comment: (NOTE) SARS-CoV-2 target nucleic acids are NOT DETECTED.  The SARS-CoV-2 RNA is generally detectable in upper respiratory specimens during the acute phase of infection. The lowest concentration of SARS-CoV-2 viral copies this assay can detect is 138 copies/mL. A negative result does not preclude SARS-Cov-2 infection and should not be used as the sole basis for treatment or other patient management decisions. A negative result may occur with  improper specimen collection/handling, submission of specimen other than nasopharyngeal swab, presence of viral mutation(s) within the areas targeted by this assay, and inadequate number of viral copies(<138 copies/mL). A negative result must be combined with clinical observations, patient history, and epidemiological information.  The expected result is Negative.  Fact Sheet for Patients:  EntrepreneurPulse.com.au  Fact Sheet for Healthcare Providers:  IncredibleEmployment.be  This test is no t yet approved or cleared by the [place] FDA and  has been authorized for detection and/or diagnosis of SARS-CoV-2 by FDA under an Emergency Use Authorization (EUA). This EUA will remain  in effect (meaning this test can be used) for the duration of the COVID-19 declaration under Section 564(b)(1) of the Act, 21 U.S.C.section 360bbb-3(b)(1), unless the authorization is terminated  or revoked sooner.       Influenza A by PCR NEGATIVE NEGATIVE   Influenza B by PCR NEGATIVE NEGATIVE    Comment: (NOTE) The Xpert Xpress SARS-CoV-2/FLU/RSV plus assay is intended as an aid in the diagnosis of influenza from Nasopharyngeal swab specimens and should not be used as a sole basis for treatment. Nasal washings and aspirates are unacceptable for Xpert Xpress SARS-CoV-2/FLU/RSV testing.  Fact Sheet for Patients: EntrepreneurPulse.com.au  Fact Sheet for Healthcare Providers: IncredibleEmployment.be  This test is not yet approved or cleared by the [place] FDA and has been authorized for detection and/or diagnosis of SARS-CoV-2 by FDA under an Emergency Use Authorization (EUA). This EUA will remain in effect (meaning this test can be used) for the duration of the COVID-19 declaration under Section 564(b)(1) of the Act, 21 U.S.C. section 360bbb-3(b)(1), unless the authorization is terminated or revoked.  Performed at [facility], [address], [place], [place] [address]   Urinalysis, Routine w reflex microscopic Urine, Clean Catch     Status: None   Collection Time: 05/02/21 12:10 AM  Result Value Ref Range   Color, Urine YELLOW YELLOW   APPearance CLEAR CLEAR   Specific Gravity, Urine 1.006 1.005 - 1.030   pH 6.0 5.0 - 8.0   Glucose, UA  NEGATIVE NEGATIVE mg/dL   Hgb urine dipstick NEGATIVE NEGATIVE   Bilirubin Urine NEGATIVE NEGATIVE   Ketones, ur NEGATIVE NEGATIVE mg/dL   Protein, ur NEGATIVE NEGATIVE mg/dL   Nitrite NEGATIVE NEGATIVE   Leukocytes,Ua NEGATIVE NEGATIVE    Comment: Performed at [facility], [address], [place], [place] [address]    Chemistries  Recent Labs  Lab 05/01/21 1722  NA 139  K 4.0  CL 102  CO2 28  GLUCOSE 206*  BUN 15  CREATININE 0.63  CALCIUM 8.8*  MG 2.1  AST 34  ALT 28  ALKPHOS 84  BILITOT 0.8   ------------------------------------------------------------------------------------------------------------------  ------------------------------------------------------------------------------------------------------------------ GFR: Estimated Creatinine Clearance: 89.4 mL/min (by C-G formula based on SCr of 0.63 mg/dL). Liver Function Tests: Recent Labs  Lab 05/01/21 1722  AST 34  ALT 28  ALKPHOS 84  BILITOT 0.8  PROT 7.9  ALBUMIN 4.2   No results for input(s):  LIPASE, AMYLASE in the last 168 hours. No results for input(s): AMMONIA in the last 168 hours. Coagulation Profile: No results for input(s): INR, PROTIME in the last 168 hours. Cardiac Enzymes: No results for input(s): CKTOTAL, CKMB, CKMBINDEX, TROPONINI in the last 168 hours. BNP (last 3 results) No results for input(s): PROBNP in the last 8760 hours. HbA1C: No results for input(s): HGBA1C in the last 72 hours. CBG: No results for input(s): GLUCAP in the last 168 hours. Lipid Profile: No results for input(s): CHOL, HDL, LDLCALC, TRIG, CHOLHDL, LDLDIRECT in the last 72 hours. Thyroid Function Tests: No results for input(s): TSH, T4TOTAL, FREET4, T3FREE, THYROIDAB in the last 72 hours. Anemia Panel: No results for input(s): VITAMINB12, FOLATE, FERRITIN, TIBC, IRON, RETICCTPCT in the last 72  hours.  --------------------------------------------------------------------------------------------------------------- Urine analysis:    Component Value Date/Time   COLORURINE YELLOW 05/02/2021 0010   APPEARANCEUR CLEAR 05/02/2021 0010   LABSPEC 1.006 05/02/2021 0010   PHURINE 6.0 05/02/2021 0010   GLUCOSEU NEGATIVE 05/02/2021 0010   HGBUR NEGATIVE 05/02/2021 0010   BILIRUBINUR NEGATIVE 05/02/2021 0010   KETONESUR NEGATIVE 05/02/2021 0010   PROTEINUR NEGATIVE 05/02/2021 0010   NITRITE NEGATIVE 05/02/2021 0010   LEUKOCYTESUR NEGATIVE 05/02/2021 0010      Imaging Results:    CT HEAD WO CONTRAST (5MM)  Result Date: 05/01/2021 CLINICAL DATA:  Headache.  Auditory hallucinations. EXAM: CT HEAD WITHOUT CONTRAST TECHNIQUE: Contiguous axial images were obtained from the base of the skull through the vertex without intravenous contrast. COMPARISON:  None. FINDINGS: Brain: No evidence of acute infarction, hemorrhage, hydrocephalus, extra-axial collection or mass lesion/mass effect. Vascular: Negative for hyperdense vessel Skull: Negative Sinuses/Orbits: Negative Other: None IMPRESSION: Negative CT head Electronically Signed   By: Franchot Gallo M.D.   On: 05/01/2021 17:46   EKG shows a heart rate of 81, sinus rhythm, QTC 458 by my personal review   Assessment & Plan:    Active Problems:   CVA (cerebral vascular accident) ([place])   Diabetes mellitus, type 2 ([place])   Hypertension   Possible CVA Acute deficit is ataxia CT head without acute changes MR without contrast ordered for a.m.-patient refuses to take any kind of IV contrast Echo in the a.m. PT/ST/OT eval and treat Check lipid panel, maintain glycemic control Given 1 day of symptoms, permissive hypertension Stroke order set utilized Continue to monitor Diabetes mellitus type 2 Hemoglobin A1c pending Patient takes low-dose basal insulin at home, if sugars are not controlled here with sliding scale we will add on basal Sliding  scale coverage Continue to monitor Hypothyroidism New Synthroid 50 mcg tabs Check TSH Hypertension Holding antihypertensives for permissive hypertension in the setting of possible stroke   DVT Prophylaxis-   Heparin - SCDs   AM Labs Ordered, also please review Full Orders  Family Communication: No family at bedside Code Status: Full  Admission status: Observation    * I certify that at the point of admission it is my clinical judgment that the patient will require inpatient hospital care spanning beyond 2 midnights from the point of admission due to high intensity of service, high risk for further deterioration and high frequency of surveillance required.*  Disposition: Anticipated Discharge 1-2 days discharge to home  Time spent in minutes : [place] DO

## 2021-05-02 NOTE — Evaluation (Signed)
Physical Therapy Evaluation Patient Details Name: Sheryl Henry MRN: [id number] DOB: [birth year] Today's Date: 05/02/2021  History of Present Illness  Caetlin Belger  is a 65 y.o. female, with history of diabetes mellitus type 2 and hypertension presents the ED with a chief complaint of ringing in her head.  Patient reports that the day prior to presentation she was totally at her normal state of health.  The day prior to that she heard a high-volume sound in her head and felt like it made her whole head vibrate.  After that she had barely any sleep all night.  Symptoms seem to resolve and then the day of presentation it happened again.  Around lunchtime she had an intense feeling in her head.  She does not characterize it as pain, but cannot describe the sensation.  She laid down to take a nap at 2:00.  When she woke up she had the high-volume sound in her head again.  She reports that it happened so fast she cannot describe the characteristics of it.  He goes "through her head" but she cannot describe where it starts and finishes.  She says that the feeling that she has after that is like her brain needs a repeat.  It is an intense feeling and she does not know how to make it go away.  She knows for sure the soundwave brings it on.  The feeling that her brain needs to remove it is in both of her temples.  She again says that its not the pain.  Per patient, this soundwave is her main concern.  Son had told the ER that she has had this before and she has been off some of her psych medications.  They were planning to discharge her home with follow-up to psychiatry, patient then announced that she has not been able to walk all day.  She reports she feels like she is staggering.  She does not feel dizzy or like the room is spinning.  She has no changes in her vision.  She reports that she has to hold on from wall-to-wall to walk around the house today.  This is an acute change for her.  She has had no  dysphagia, no facial droop.  She does not endorse an asymmetric weakness.  She reports when she tries to walk forward she actually ends up walking backwards, but its not a problem with balance.  She almost fell yesterday, but was able to hold onto a wall.  She had a decrease in appetite, but no fever.  Patient does report that prior to this episode she was having abdominal pain that was periumbilical.  She was started on a PPI that seems to help.  Patient denies any dysuria hematuria.  She has had normal bowel movements.  Patient has no chest pain or palpitations.  Patient has no other complaints at this time.   Clinical Impression  Patient limited for functional mobility as stated below secondary to BLE weakness, fatigue and poor standing balance. Patient requires cueing for transfer to standing with use of RW due to inability to perform without AD. She requires assist for weakness and demonstrates impaired standing and activity tolerance with fatigue. She is limited to a few lateral shuffled steps at bedside. Patient will benefit from continued physical therapy in hospital and recommended venue below to increase strength, balance, endurance for safe ADLs and gait.        Recommendations for follow up therapy are one component of a  multi-disciplinary discharge planning process, led by the attending physician.  Recommendations may be updated based on patient status, additional functional criteria and insurance authorization.  Follow Up Recommendations Skilled nursing-short term rehab (<3 hours/day)    Assistance Recommended at Discharge Frequent or constant Supervision/Assistance  Functional Status Assessment Patient has had a recent decline in their functional status and demonstrates the ability to make significant improvements in function in a reasonable and predictable amount of time.  Equipment Recommendations  None recommended by PT    Recommendations for Other Services       Precautions /  Restrictions Precautions Precautions: Fall Restrictions Weight Bearing Restrictions: No      Mobility  Bed Mobility Overal bed mobility: Needs Assistance Bed Mobility: Supine to Sit;Sit to Supine     Supine to sit: Min guard Sit to supine: Min guard   General bed mobility comments: slow, labored    Transfers Overall transfer level: Needs assistance Equipment used: Rolling walker (2 wheels) Transfers: Sit to/from Stand Sit to Stand: Min assist           General transfer comment: patient requires cueing and assist for weakness, unable without RW    Ambulation/Gait Ambulation/Gait assistance: Min assist Gait Distance (Feet): 1 Feet Assistive device: Rolling walker (2 wheels)         General Gait Details: small, shuffled, lateral steps at bedside  Stairs            Wheelchair Mobility    Modified Rankin (Stroke Patients Only)       Balance Overall balance assessment: Needs assistance   Sitting balance-Leahy Scale: Good Sitting balance - Comments: seated EOB   Standing balance support: Bilateral upper extremity supported;Reliant on assistive device for balance Standing balance-Leahy Scale: Poor                               Pertinent Vitals/Pain Pain Assessment: No/denies pain    Home Living Family/patient expects to be discharged to:: Private residence Living Arrangements: Alone Available Help at Discharge: Family;Available PRN/intermittently Type of Home: Mobile home Home Access: Stairs to enter Entrance Stairs-Rails: Right Entrance Stairs-Number of Steps: 4   Home Layout: One level Home Equipment: Hand held shower head;Grab bars - tub/shower      Prior Function Prior Level of Function : Needs assist;Independent/Modified Independent       Physical Assist : ADLs (physical)     Mobility Comments: states ambulation without AD ADLs Comments: mostly independent, family assists PRN     Hand Dominance         Extremity/Trunk Assessment   Upper Extremity Assessment Upper Extremity Assessment: Defer to OT evaluation    Lower Extremity Assessment Lower Extremity Assessment: Generalized weakness    Cervical / Trunk Assessment Cervical / Trunk Assessment: Normal  Communication   Communication: No difficulties  Cognition Arousal/Alertness: Awake/alert Behavior During Therapy: WFL for tasks assessed/performed Overall Cognitive Status: Within Functional Limits for tasks assessed                                          General Comments      Exercises     Assessment/Plan    PT Assessment Patient needs continued PT services  PT Problem List Decreased strength;Decreased activity tolerance;Decreased balance;Decreased mobility       PT Treatment Interventions DME instruction;Balance training;Gait training;Neuromuscular  re-education;Stair training;Functional mobility training;Patient/family education;Therapeutic activities;Therapeutic exercise;Manual techniques    PT Goals (Current goals can be found in the Care Plan section)  Acute Rehab PT Goals Patient Stated Goal: Return home PT Goal Formulation: With patient Time For Goal Achievement: 05/16/21 Potential to Achieve Goals: Good    Frequency Min 3X/week   Barriers to discharge        Co-evaluation PT/OT/SLP Co-Evaluation/Treatment: Yes Reason for Co-Treatment: Complexity of the patient's impairments (multi-system involvement);To address functional/ADL transfers PT goals addressed during session: Mobility/safety with mobility;Balance;Proper use of DME;Strengthening/ROM         AM-PAC PT "6 Clicks" Mobility  Outcome Measure Help needed turning from your back to your side while in a flat bed without using bedrails?: None Help needed moving from lying on your back to sitting on the side of a flat bed without using bedrails?: A Little Help needed moving to and from a bed to a chair (including a wheelchair)?: A  Lot Help needed standing up from a chair using your arms (e.g., wheelchair or bedside chair)?: A Lot Help needed to walk in hospital room?: A Lot Help needed climbing 3-5 steps with a railing? : Total 6 Click Score: 14    End of Session Equipment Utilized During Treatment: Gait belt Activity Tolerance: Patient limited by fatigue Patient left: in bed;with call bell/phone within reach;with bed alarm set Nurse Communication: Mobility status PT Visit Diagnosis: Unsteadiness on feet (R26.81);Other abnormalities of gait and mobility (R26.89);Muscle weakness (generalized) (M62.81)    Time: 5053-9767 PT Time Calculation (min) (ACUTE ONLY): 25 min   Charges:   PT Evaluation $PT Eval Low Complexity: 1 Low          9:22 AM, 05/02/21 Wyman Songster PT, DPT Physical Therapist at [facility]

## 2021-05-02 NOTE — Progress Notes (Signed)
  Echocardiogram 2D Echocardiogram has been performed.  Sheryl Henry 05/02/2021, 9:21 AM

## 2021-05-02 NOTE — Progress Notes (Signed)
Patient's room computer has no mouse. Non functioning.

## 2021-05-02 NOTE — Progress Notes (Signed)
Patient's files from [facility] arrived. Notified Dr. Laural Benes.

## 2021-05-02 NOTE — Plan of Care (Signed)
  Problem: Acute Rehab OT Goals (only OT should resolve) Goal: Pt. Will Perform Grooming Flowsheets (Taken 05/02/2021 1233) Pt Will Perform Grooming:  with min guard assist  standing  with adaptive equipment Goal: Pt. Will Perform Lower Body Bathing Flowsheets (Taken 05/02/2021 1233) Pt Will Perform Lower Body Bathing:  with modified independence  sitting/lateral leans  with adaptive equipment Goal: Pt. Will Perform Lower Body Dressing Flowsheets (Taken 05/02/2021 1233) Pt Will Perform Lower Body Dressing:  with modified independence  with adaptive equipment  sitting/lateral leans Goal: Pt. Will Transfer To Toilet Flowsheets (Taken 05/02/2021 1233) Pt Will Transfer to Toilet:  with min guard assist  stand pivot transfer Goal: Pt/Caregiver Will Perform Home Exercise Program Flowsheets (Taken 05/02/2021 1233) Pt/caregiver will Perform Home Exercise Program:  Increased strength  Both right and left upper extremity  With Supervision  Rejeana Fadness OT, MOT

## 2021-05-02 NOTE — Progress Notes (Signed)
Patient's computer in her room does not function. Verified medicine and went to computer stationed outside room to scan. 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 Mri tech here to pick up patient.

## 2021-05-02 NOTE — Procedures (Signed)
Preprocedure Dx: HA, ataxia Postprocedure Dx: Ha, ATAXIA Procedure:  Fluoroscopically guided lumbar puncture Radiologist:  Tyron Russell Anesthesia:  2 ml of 1% lidocaine Specimen:  11 ml CSF, CLEAR COLORLESS EBL:   < 1 ml Complications:  None

## 2021-05-02 NOTE — Plan of Care (Signed)
  Problem: Acute Rehab PT Goals(only PT should resolve) Goal: Pt Will Go Supine/Side To Sit Outcome: Progressing Flowsheets (Taken 05/02/2021 0923) Pt will go Supine/Side to Sit: with modified independence Goal: Patient Will Transfer Sit To/From Stand Outcome: Progressing Flowsheets (Taken 05/02/2021 [phone]) Patient will transfer sit to/from stand: with min guard assist Goal: Pt Will Transfer Bed To Chair/Chair To Bed Outcome: Progressing Flowsheets (Taken 05/02/2021 0923) Pt will Transfer Bed to Chair/Chair to Bed: min guard assist Goal: Pt Will Ambulate Outcome: Progressing Flowsheets (Taken 05/02/2021 0923) Pt will Ambulate:  with minimal assist  with least restrictive assistive device  25 feet Goal: Pt/caregiver will Perform Home Exercise Program Outcome: Progressing Flowsheets (Taken 05/02/2021 0923) Pt/caregiver will Perform Home Exercise Program:  For increased strengthening  For improved balance  Independently  9:24 AM, 05/02/21 Wyman Songster PT, DPT Physical Therapist at [facility]

## 2021-05-03 DIAGNOSIS — E854 Organ-limited amyloidosis: Secondary | ICD-10-CM | POA: Diagnosis not present

## 2021-05-03 DIAGNOSIS — R29898 Other symptoms and signs involving the musculoskeletal system: Secondary | ICD-10-CM | POA: Diagnosis not present

## 2021-05-03 DIAGNOSIS — R55 Syncope and collapse: Secondary | ICD-10-CM | POA: Diagnosis not present

## 2021-05-03 DIAGNOSIS — E1165 Type 2 diabetes mellitus with hyperglycemia: Secondary | ICD-10-CM | POA: Diagnosis not present

## 2021-05-03 LAB — GLUCOSE, CAPILLARY
Glucose-Capillary: 194 mg/dL — ABNORMAL HIGH (ref 70–99)
Glucose-Capillary: 230 mg/dL — ABNORMAL HIGH (ref 70–99)
Glucose-Capillary: 186 mg/dL — ABNORMAL HIGH (ref 70–99)
Glucose-Capillary: 160 mg/dL — ABNORMAL HIGH (ref 70–99)

## 2021-05-03 LAB — IGA: IgA: 371 mg/dL — ABNORMAL HIGH (ref 87–352)

## 2021-05-03 LAB — MRSA NEXT GEN BY PCR, NASAL: MRSA by PCR Next Gen: NOT DETECTED

## 2021-05-03 MED ORDER — HEPARIN SODIUM (PORCINE) 5000 UNIT/ML IJ SOLN
5000.0000 [IU] | Freq: Three times a day (TID) | INTRAMUSCULAR | Status: DC
  Administered 2021-05-04 – 2021-05-07 (×11): 5000 [IU] via SUBCUTANEOUS
  Filled 2021-05-03 (×10): qty 1

## 2021-05-03 MED ORDER — INSULIN ASPART 100 UNIT/ML IJ SOLN
5.0000 [IU] | Freq: Three times a day (TID) | INTRAMUSCULAR | Status: DC
  Administered 2021-05-03 – 2021-05-12 (×18): 5 [IU] via SUBCUTANEOUS

## 2021-05-03 NOTE — Progress Notes (Signed)
Patient did vital capacity of 1.5L with good effort.

## 2021-05-03 NOTE — Progress Notes (Signed)
Pt has been admitted to the unit via PTAR. All belongings are at the bedside and all equipment in the room. Telephone and call light are within reach.   05/03/21 1711  Vitals  Temp 98.7 F (37.1 C)  Temp Source Oral  BP 106/60  MAP (mmHg) 75  BP Location Right Arm  BP Method Automatic  Pulse Rate (!) 107  Pulse Rate Source Dinamap  Resp 20  Level of Consciousness  Level of Consciousness Alert  MEWS COLOR  MEWS Score Color Green  Oxygen Therapy  SpO2 98 %  O2 Device Nasal Cannula  O2 Flow Rate (L/min) 1.5 L/min  MEWS Score  MEWS Temp 0  MEWS Systolic 0  MEWS Pulse 1  MEWS RR 0  MEWS LOC 0  MEWS Score 1

## 2021-05-03 NOTE — Progress Notes (Signed)
PROGRESS NOTE   Sheryl Henry  ZOX:[id number] DOB: 20-Dec-1955 DOA: 05/01/2021 PCP: Rebekah Chesterfield, NP   Chief Complaint  Patient presents with   Hallucinations    Auditory hallucination started after halloween.  "Felt sound-wave in head about one hour away.  Pt thinks neighbors are watching her with cameras in her house.     Level of care: Stepdown  Brief Admission History:  65 y.o. female, with history of diabetes mellitus type 2 and hypertension presents the ED with a chief complaint of ringing in her head.  Patient reports that the day prior to presentation she was totally at her normal state of health.  The day prior to that she heard a high-volume sound in her head and felt like it made her whole head vibrate.  After that she had barely any sleep all night.  Symptoms seem to resolve and then the day of presentation it happened again.  Around lunchtime she had an intense feeling in her head.  She does not characterize it as pain, but cannot describe the sensation.  She laid down to take a nap at 2:00.  When she woke up she had the high-volume sound in her head again.  She reports that it happened so fast she cannot describe the characteristics of it.  He goes "through her head" but she cannot describe where it starts and finishes.  She says that the feeling that she has after that is like her brain needs a repeat.  It is an intense feeling and she does not know how to make it go away.  She knows for sure the soundwave brings it on.  The feeling that her brain needs to remove it is in both of her temples.  She again says that its not the pain.  Per patient, this soundwave is her main concern.  Son had told the ER that she has had this before and she has been off some of her psych medications.  They were planning to discharge her home with follow-up to psychiatry, patient then announced that she has not been able to walk all day.  She reports she feels like she is staggering.  She does not  feel dizzy or like the room is spinning.   Her LE weakness worsened after receiving the flu and covid vaccines recently.    Assessment & Plan:   Active Problems:   Leg weakness, bilateral   Migraine headache with aura   Diabetes mellitus, type 2 (HCC)   Hypertension   Ataxia   Cutaneous amyloidosis (HCC)   Vasovagal syncope  Bilateral Leg weakness - concern  for Guillain-Barr syndrome - difficult to discern as she has these symptoms chronically but became worse after receiving Covid and flu vaccine at the same time in each arm.  I discussed with neurology and pt has equivocal lumbar puncture done on 11/11.  I discussed with Dr. Melynda Ripple on 11/12 and her recommendation was to transfer patient to [facility] to be seen by Dr. Iver Nestle.  He recommended that we do Q4 hour NIV and FVC testing which has been ordered.  MRI of brain T/L spine did not show findings that would cause her current symptoms.   Type 2 diabetes mellitus with neurological complications - uncontrolled as evidenced by an A1c of 7.3% - continue SSI coverage, prandial coverage and CBG monitoring  Essential hypertension  - BP meds held after patient had vasovagal syncope 11/11 and became hypotensive responding to fluid boluses  AL Amyloidosis  -  Pt followed by Dr. Marlaine Hind at [facility] at the oncology center - Pt has not followed up since Dec 2021  - Treated in the past but not been on any recent therapies in last several years  Migraine Headache - pt responded favorably to migraine cocktail given 11/11   DVT prophylaxis: heparin Code Status: full  Family Communication: t/c to son 11/12  Disposition: transfer to [name] for neurology consultation  Status is: Observation  The patient remains OBS appropriate and will d/c before 2 midnights.   Consultants:  neurology  Procedures:    Antimicrobials:  N/a   Subjective: Pt reports her headache is better but her legs remain very weak and not at baseline strength.    Objective: Vitals:   05/03/21 0600 05/03/21 0815 05/03/21 0900 05/03/21 1000  BP: (!) 160/67  122/69 (!) 150/67  Pulse: 98  (!) 106 100  Resp: 20  20 (!) 23  Temp:  97.8 F (36.6 C)    TempSrc:  Oral    SpO2: 96%  90% 94%  Weight:      Height:        Intake/Output Summary (Last 24 hours) at 05/03/2021 1111 Last data filed at 05/02/2021 2342 Gross per 24 hour  Intake 739.6 ml  Output 703 ml  Net 36.6 ml   Filed Weights   05/01/21 1620 05/02/21 0222  Weight: 81.6 kg 106.9 kg    Examination:  General exam: Appears calm and comfortable  Respiratory system: Clear to auscultation. Respiratory effort normal. Cardiovascular system: normal S1 & S2 heard. No JVD, murmurs, rubs, gallops or clicks. No pedal edema. Gastrointestinal system: Abdomen is nondistended, soft and nontender. No organomegaly or masses felt. Normal bowel sounds heard. Central nervous system: Alert and oriented. No focal neurological deficits. Extremities: proximal muscle weakness both lower extremities, reflexes weak both lower extremities.  Skin: No rashes, lesions or ulcers Psychiatry: Judgement and insight appear normal.  Mood & affect appropriate.   Data Reviewed: I have personally reviewed following labs and imaging studies  CBC: Recent Labs  Lab 05/01/21 1722  WBC 8.4  NEUTROABS 5.8  HGB 16.6*  HCT 46.0  MCV 88.3  PLT 150    Basic Metabolic Panel: Recent Labs  Lab 05/01/21 1722  NA 139  K 4.0  CL 102  CO2 28  GLUCOSE 206*  BUN 15  CREATININE 0.63  CALCIUM 8.8*  MG 2.1    GFR: Estimated Creatinine Clearance: 89.4 mL/min (by C-G formula based on SCr of 0.63 mg/dL).  Liver Function Tests: Recent Labs  Lab 05/01/21 1722  AST 34  ALT 28  ALKPHOS 84  BILITOT 0.8  PROT 7.9  ALBUMIN 4.2    CBG: Recent Labs  Lab 05/02/21 1617 05/02/21 1703 05/02/21 2107 05/03/21 0755  GLUCAP 272* 274* 287* 194*    Recent Results (from the past 240 hour(s))  Resp Panel by RT-PCR  (Flu A&B, Covid) Urine, Clean Catch     Status: None   Collection Time: 05/02/21 12:06 AM   Specimen: Urine, Clean Catch; Nasopharyngeal(NP) swabs in vial transport medium  Result Value Ref Range Status   SARS Coronavirus 2 by RT PCR NEGATIVE NEGATIVE Final    Comment: (NOTE) SARS-CoV-2 target nucleic acids are NOT DETECTED.  The SARS-CoV-2 RNA is generally detectable in upper respiratory specimens during the acute phase of infection. The lowest concentration of SARS-CoV-2 viral copies this assay can detect is 138 copies/mL. A negative result does not preclude SARS-Cov-2 infection and should not  be used as the sole basis for treatment or other patient management decisions. A negative result may occur with  improper specimen collection/handling, submission of specimen other than nasopharyngeal swab, presence of viral mutation(s) within the areas targeted by this assay, and inadequate number of viral copies(<138 copies/mL). A negative result must be combined with clinical observations, patient history, and epidemiological information. The expected result is Negative.  Fact Sheet for Patients:  BloggerCourse.com  Fact Sheet for Healthcare Providers:  SeriousBroker.it  This test is no t yet approved or cleared by the [place] FDA and  has been authorized for detection and/or diagnosis of SARS-CoV-2 by FDA under an Emergency Use Authorization (EUA). This EUA will remain  in effect (meaning this test can be used) for the duration of the COVID-19 declaration under Section 564(b)(1) of the Act, 21 U.S.C.section 360bbb-3(b)(1), unless the authorization is terminated  or revoked sooner.       Influenza A by PCR NEGATIVE NEGATIVE Final   Influenza B by PCR NEGATIVE NEGATIVE Final    Comment: (NOTE) The Xpert Xpress SARS-CoV-2/FLU/RSV plus assay is intended as an aid in the diagnosis of influenza from Nasopharyngeal swab specimens  and should not be used as a sole basis for treatment. Nasal washings and aspirates are unacceptable for Xpert Xpress SARS-CoV-2/FLU/RSV testing.  Fact Sheet for Patients: BloggerCourse.com  Fact Sheet for Healthcare Providers: SeriousBroker.it  This test is not yet approved or cleared by the [place] FDA and has been authorized for detection and/or diagnosis of SARS-CoV-2 by FDA under an Emergency Use Authorization (EUA). This EUA will remain in effect (meaning this test can be used) for the duration of the COVID-19 declaration under Section 564(b)(1) of the Act, 21 U.S.C. section 360bbb-3(b)(1), unless the authorization is terminated or revoked.  Performed at [facility], [address] [place], [place] [address]   MRSA Next Gen by PCR, Nasal     Status: None   Collection Time: 05/02/21  9:11 PM   Specimen: Nasal Mucosa; Nasal Swab  Result Value Ref Range Status   MRSA by PCR Next Gen NOT DETECTED NOT DETECTED Final    Comment: (NOTE) The GeneXpert MRSA Assay (FDA approved for NASAL specimens only), is one component of a comprehensive MRSA colonization surveillance program. It is not intended to diagnose MRSA infection nor to guide or monitor treatment for MRSA infections. Test performance is not FDA approved in patients less than 13 years old. Performed at [facility], [address], [place], [place] [address]      Radiology Studies: CT HEAD WO CONTRAST ( )  Result Date: 05/01/2021 CLINICAL DATA:  Headache.  Auditory hallucinations. EXAM: CT HEAD WITHOUT CONTRAST TECHNIQUE: Contiguous axial images were obtained from the base of the skull through the vertex without intravenous contrast. COMPARISON:  None. FINDINGS: Brain: No evidence of acute infarction, hemorrhage, hydrocephalus, extra-axial collection or mass lesion/mass effect. Vascular: Negative for hyperdense vessel Skull: Negative Sinuses/Orbits: Negative Other:  None IMPRESSION: Negative CT head Electronically Signed   By: Marlan Palau M.D.   On: 05/01/2021 17:46   MR ANGIO HEAD WO CONTRAST  Result Date: 05/02/2021 CLINICAL DATA:  Headache, new or worsening (Age >= 50y) EXAM: MR VENOGRAM HEAD WITHOUT CONTRAST MR ANGIOGRAPHY HEAD WITHOUT CONTRAST TECHNIQUE: Angiographic images of the intracranial venous structures were acquired using MRV technique without intravenous contrast. Angiographic images of the Circle of Willis were acquired using MRA technique without intravenous contrast. COMPARISON:  No pertinent prior exam. FINDINGS: MRA head: Intracranial internal carotid arteries  are patent. There is a 1.5 mm inferiorly directed outpouching from the distal supraclinoid right ICA. There is likely a small vessel arising from this. Included anterior and middle cerebral arteries are patent. Intracranial vertebral arteries are patent. Basilar artery is patent. Major cerebellar artery origins are patent. Posterior cerebral arteries are patent. MRV head: Superior sagittal sinus, straight sinus, vein of Galen, internal cerebral veins, and basal veins are patent. Both transverse and sigmoid sinuses are patent. Right transverse sinus is dominant. IMPRESSION: No proximal intracranial vessel occlusion. No evidence of dural sinus thrombosis. 1.5 mm outpouching from distal supraclinoid right ICA likely represents an infundibulum rather than aneurysm. Electronically Signed   By: Guadlupe Spanish M.D.   On: 05/02/2021 16:15   MR BRAIN WO CONTRAST  Result Date: 05/02/2021 CLINICAL DATA:  Neuro deficit, acute, stroke suspected EXAM: MRI HEAD WITHOUT CONTRAST TECHNIQUE: Multiplanar, multiecho pulse sequences of the brain and surrounding structures were obtained without intravenous contrast. COMPARISON:  None. FINDINGS: Brain: No acute infarction, hemorrhage, hydrocephalus, extra-axial collection or mass lesion. Mild for age scattered T2 hyperintensities in the supratentorial and  pontine white matter, nonspecific but compatible with chronic microvascular disease. Mild atrophy. Vascular: Major arterial flow voids are maintained at the skull base. Skull and upper cervical spine: Normal marrow signal. Sinuses/Orbits: Clear sinuses.  Unremarkable orbits. Other: No mastoid effusions. IMPRESSION: 1. No evidence of acute intracranial abnormality, including acute infarct. 2. 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 Electronically Signed   By: Feliberto Harts M.D.   On: 05/02/2021 11:34   MR THORACIC SPINE WO CONTRAST  Result Date: 05/02/2021 CLINICAL DATA:  Ataxia, nontraumatic, T-spine pathology suspected; Myelopathy, acute or progressive Low back pain, cauda equina syndrome suspected EXAM: MRI THORACIC AND LUMBAR SPINE WITHOUT CONTRAST TECHNIQUE: Multiplanar and multiecho pulse sequences of the thoracic and lumbar spine were obtained without intravenous contrast. COMPARISON:  None. FINDINGS: MRI THORACIC SPINE FINDINGS Mildly motion limited study. Alignment: No substantial sagittal subluxation. Mild kyphosis at T10-T11. Vertebrae: Vertebral body heights are maintained. No focal marrow edema to suggest acute fracture or discitis/osteomyelitis. Heterogeneous bone marrow without suspicious bone lesion. Cord: Mildly motion limited evaluation without evidence of abnormal cord signal or abnormal cord morphology. Paraspinal and other soft tissues: Unremarkable. Disc levels: Small disc bulge at T10-T11 with ligamentum flavum thickening and facet hypertrophy at this level. Resulting mild canal stenosis. Otherwise, no significant canal stenosis. Multilevel facet arthropathy moderate foraminal stenosis on the right at T1-T2 and the left at T8-T9. Otherwise, multilevel mild-to-moderate foraminal stenosis. MRI LUMBAR SPINE FINDINGS Segmentation:  Standard. Alignment:  No substantial sagittal subluxation. Vertebrae: Vertebral body heights are maintained. Degenerative/discogenic endplate  signal changes about the right eccentric L2-L3 disc. Otherwise, no focal marrow edema to suggest acute fracture discitis/osteomyelitis. Heterogeneous bone marrow without suspicious bone lesion. Conus medullaris and cauda equina: Conus extends to the T12-L1 level. Conus appears normal. Paraspinal and other soft tissues: Unremarkable. Disc levels: T12-L1: Mild disc bulging without significant stenosis. L1-L2: Mild disc bulging and bilateral facet arthropathy without significant stenosis. L2-L3: Left eccentric disc height loss. Left eccentric disc bulging and endplate spurring. Bilateral facet arthropathy. Resulting mild to moderate canal and left greater than right subarticular recess stenosis. Mild-to-moderate left foraminal stenosis with left far lateral disc closely approximating the exiting left L2 nerve. L3-L4: Broad disc bulge with ligamentum flavum thickening and right greater than left facet arthropathy. Resulting moderate canal stenosis with moderate to severe right subarticular recess and foraminal stenosis. Mild left foraminal stenosis. L4-L5: Broad disc bulge with  ligamentum flavum thickening and bilateral facet arthropathy. Resulting mild to moderate canal and bilateral subarticular recess stenosis. Mild bilateral foraminal stenosis. L5-S1: Disc bulging with bilateral facet arthropathy. No significant canal or foraminal stenosis. IMPRESSION: MR THORACIC SPINE IMPRESSION 1. Moderate foraminal stenosis on the right at T1-T2 and the left at T8-T9. Otherwise, multilevel mild-to-moderate foraminal stenosis. 2. Mild canal stenosis at T10-T11. MR LUMBAR SPINE IMPRESSION 1. At L3-L4, moderate to severe right subarticular recess and foraminal stenosis with moderate canal stenosis. 2. At the L2-L3, mild-to-moderate canal and left greater than right subarticular recess stenosis with mild-to-moderate left foraminal stenosis and left far lateral disc closely approximating the exiting left L2 nerve. 3. At L4-L5,  mild-to-moderate canal and bilateral subarticular recess stenosis with mild bilateral foraminal stenosis. Electronically Signed   By: Feliberto Harts M.D.   On: 05/02/2021 12:03   MR LUMBAR SPINE WO CONTRAST  Result Date: 05/02/2021 CLINICAL DATA:  Ataxia, nontraumatic, T-spine pathology suspected; Myelopathy, acute or progressive Low back pain, cauda equina syndrome suspected EXAM: MRI THORACIC AND LUMBAR SPINE WITHOUT CONTRAST TECHNIQUE: Multiplanar and multiecho pulse sequences of the thoracic and lumbar spine were obtained without intravenous contrast. COMPARISON:  None. FINDINGS: MRI THORACIC SPINE FINDINGS Mildly motion limited study. Alignment: No substantial sagittal subluxation. Mild kyphosis at T10-T11. Vertebrae: Vertebral body heights are maintained. No focal marrow edema to suggest acute fracture or discitis/osteomyelitis. Heterogeneous bone marrow without suspicious bone lesion. Cord: Mildly motion limited evaluation without evidence of abnormal cord signal or abnormal cord morphology. Paraspinal and other soft tissues: Unremarkable. Disc levels: Small disc bulge at T10-T11 with ligamentum flavum thickening and facet hypertrophy at this level. Resulting mild canal stenosis. Otherwise, no significant canal stenosis. Multilevel facet arthropathy moderate foraminal stenosis on the right at T1-T2 and the left at T8-T9. Otherwise, multilevel mild-to-moderate foraminal stenosis. MRI LUMBAR SPINE FINDINGS Segmentation:  Standard. Alignment:  No substantial sagittal subluxation. Vertebrae: Vertebral body heights are maintained. Degenerative/discogenic endplate signal changes about the right eccentric L2-L3 disc. Otherwise, no focal marrow edema to suggest acute fracture discitis/osteomyelitis. Heterogeneous bone marrow without suspicious bone lesion. Conus medullaris and cauda equina: Conus extends to the T12-L1 level. Conus appears normal. Paraspinal and other soft tissues: Unremarkable. Disc levels:  T12-L1: Mild disc bulging without significant stenosis. L1-L2: Mild disc bulging and bilateral facet arthropathy without significant stenosis. L2-L3: Left eccentric disc height loss. Left eccentric disc bulging and endplate spurring. Bilateral facet arthropathy. Resulting mild to moderate canal and left greater than right subarticular recess stenosis. Mild-to-moderate left foraminal stenosis with left far lateral disc closely approximating the exiting left L2 nerve. L3-L4: Broad disc bulge with ligamentum flavum thickening and right greater than left facet arthropathy. Resulting moderate canal stenosis with moderate to severe right subarticular recess and foraminal stenosis. Mild left foraminal stenosis. L4-L5: Broad disc bulge with ligamentum flavum thickening and bilateral facet arthropathy. Resulting mild to moderate canal and bilateral subarticular recess stenosis. Mild bilateral foraminal stenosis. L5-S1: Disc bulging with bilateral facet arthropathy. No significant canal or foraminal stenosis. IMPRESSION: MR THORACIC SPINE IMPRESSION 1. Moderate foraminal stenosis on the right at T1-T2 and the left at T8-T9. Otherwise, multilevel mild-to-moderate foraminal stenosis. 2. Mild canal stenosis at T10-T11. MR LUMBAR SPINE IMPRESSION 1. At L3-L4, moderate to severe right subarticular recess and foraminal stenosis with moderate canal stenosis. 2. At the L2-L3, mild-to-moderate canal and left greater than right subarticular recess stenosis with mild-to-moderate left foraminal stenosis and left far lateral disc closely approximating the exiting left L2 nerve. 3. At L4-L5,  mild-to-moderate canal and bilateral subarticular recess stenosis with mild bilateral foraminal stenosis. Electronically Signed   By: Feliberto Harts M.D.   On: 05/02/2021 12:03   MR MRV HEAD WO CM  Result Date: 05/02/2021 CLINICAL DATA:  Headache, new or worsening (Age >= 50y) EXAM: MR VENOGRAM HEAD WITHOUT CONTRAST MR ANGIOGRAPHY HEAD WITHOUT  CONTRAST TECHNIQUE: Angiographic images of the intracranial venous structures were acquired using MRV technique without intravenous contrast. Angiographic images of the Circle of Willis were acquired using MRA technique without intravenous contrast. COMPARISON:  No pertinent prior exam. FINDINGS: MRA head: Intracranial internal carotid arteries are patent. There is a 1.5 mm inferiorly directed outpouching from the distal supraclinoid right ICA. There is likely a small vessel arising from this. Included anterior and middle cerebral arteries are patent. Intracranial vertebral arteries are patent. Basilar artery is patent. Major cerebellar artery origins are patent. Posterior cerebral arteries are patent. MRV head: Superior sagittal sinus, straight sinus, vein of Galen, internal cerebral veins, and basal veins are patent. Both transverse and sigmoid sinuses are patent. Right transverse sinus is dominant. IMPRESSION: No proximal intracranial vessel occlusion. No evidence of dural sinus thrombosis. 1.5 mm outpouching from distal supraclinoid right ICA likely represents an infundibulum rather than aneurysm. Electronically Signed   By: Guadlupe Spanish M.D.   On: 05/02/2021 16:15   ECHOCARDIOGRAM COMPLETE  Result Date: 05/02/2021    ECHOCARDIOGRAM REPORT   Patient Name:   Sheryl Henry Date of Exam: 05/02/2021 Medical Rec #:  [id number]        Height:       67.0 in Accession #:    [id number]       Weight:       235.7 lb Date of Birth:  December 19, 1955        BSA:          2.168 m Patient Age:    65 years         BP:           175/79 mmHg Patient Gender: F                HR:           87 bpm. [name] Procedure: 2D Echo, Cardiac Doppler and Color Doppler Indications:    CVA  History:        Patient has no prior history of Echocardiogram examinations.                 Risk Factors:Diabetes, Hypertension and Obesity.  Sonographer:    Lavenia Atlas RDCS Referring Phys: [id number] ASIA B ZIERLE-GHOSH   Sonographer Comments: Technically challenging study due to limited acoustic windows and patient is morbidly obese. IMPRESSIONS  1. Left ventricular ejection fraction, by estimation, is 60 to 65%. The left ventricle has normal function. The left ventricle has no regional wall motion abnormalities. There is moderate left ventricular hypertrophy. Left ventricular diastolic parameters are consistent with Grade I diastolic dysfunction (impaired relaxation).  2. Right ventricular systolic function is normal. The right ventricular size is normal.  3. The mitral valve is normal in structure. No evidence of mitral valve regurgitation. No evidence of mitral stenosis.  4. The aortic valve is tricuspid. Aortic valve regurgitation is not visualized. No aortic stenosis is present. FINDINGS  Left Ventricle: Left ventricular ejection fraction, by estimation, is 60 to 65%. The left ventricle has normal function. The left ventricle has no regional wall motion abnormalities. The left ventricular internal cavity size was  normal in size. There is  moderate left ventricular hypertrophy. Left ventricular diastolic parameters are consistent with Grade I diastolic dysfunction (impaired relaxation). Normal left ventricular filling pressure. Right Ventricle: The right ventricular size is normal. No increase in right ventricular wall thickness. Right ventricular systolic function is normal. Left Atrium: Left atrial size was normal in size. Right Atrium: Right atrial size was normal in size. Pericardium: There is no evidence of pericardial effusion. Mitral Valve: The mitral valve is normal in structure. No evidence of mitral valve regurgitation. No evidence of mitral valve stenosis. Tricuspid Valve: The tricuspid valve is normal in structure. Tricuspid valve regurgitation is not demonstrated. No evidence of tricuspid stenosis. Aortic Valve: The aortic valve is tricuspid. Aortic valve regurgitation is not visualized. No aortic stenosis is  present. Aortic valve mean gradient measures 2.6 mmHg. Aortic valve peak gradient measures 4.3 mmHg. Aortic valve area, by VTI measures 2.39 cm. Pulmonic Valve: The pulmonic valve was not well visualized. Pulmonic valve regurgitation is mild. No evidence of pulmonic stenosis. Aorta: The aortic root is normal in size and structure. Venous: The inferior vena cava was not well visualized. IAS/Shunts: No atrial level shunt detected by color flow Doppler.  LEFT VENTRICLE PLAX 2D LVIDd:         3.60 cm     Diastology LVIDs:         2.60 cm     LV e' medial:    5.33 cm/s LV PW:         1.30 cm     LV E/e' medial:  11.6 LV IVS:        1.30 cm     LV e' lateral:   5.44 cm/s LVOT diam:     2.10 cm     LV E/e' lateral: 11.3 LV SV:         53 LV SV Index:   24 LVOT Area:     3.46 cm  LV Volumes (MOD) LV vol d, MOD A4C: 80.6 ml LV vol s, MOD A4C: 32.3 ml LV SV MOD A4C:     80.6 ml RIGHT VENTRICLE RV Basal diam:  1.70 cm RV S prime:     15.20 cm/s TAPSE (M-mode): 1.9 cm LEFT ATRIUM             Index        RIGHT ATRIUM           Index LA diam:        3.30 cm 1.52 cm/m   RA Area:     10.80 cm LA Vol (A2C):   42.5 ml 19.60 ml/m  RA Volume:   23.00 ml  10.61 ml/m LA Vol (A4C):   49.1 ml 22.64 ml/m LA Biplane Vol: 48.5 ml 22.37 ml/m  AORTIC VALVE AV Area (Vmax):    2.70 cm AV Area (Vmean):   2.54 cm AV Area (VTI):     2.39 cm AV Vmax:           103.76 cm/s AV Vmean:          76.637 cm/s AV VTI:            0.221 m AV Peak Grad:      4.3 mmHg AV Mean Grad:      2.6 mmHg LVOT Vmax:         80.90 cm/s LVOT Vmean:        56.200 cm/s LVOT VTI:          0.153 m  LVOT/AV VTI ratio: 0.69  AORTA Ao Root diam: 3.10 cm MITRAL VALVE MV Area (PHT): 7.51 cm    SHUNTS MV Decel Time: 101 msec    Systemic VTI:  0.15 m MV E velocity: 61.70 cm/s  Systemic Diam: 2.10 cm MV A velocity: 98.50 cm/s MV E/A ratio:  0.63 Dina Rich MD Electronically signed by Dina Rich MD Signature Date/Time: 05/02/2021/10:13:13 AM    Final    DG FL  GUIDED LUMBAR PUNCTURE  Result Date: 05/02/2021 CLINICAL DATA:  Cutaneous amyloidosis, headache, ataxia EXAM: DIAGNOSTIC LUMBAR PUNCTURE UNDER FLUOROSCOPIC GUIDANCE COMPARISON:  CT head 05/01/2021, MR head 05/02/2021 FLUOROSCOPY TIME:  Fluoroscopy Time:  0 minutes 24 seconds Radiation Exposure Index (if provided by the fluoroscopic device): 8.70 mGy Number of Acquired Spot Images: 1 PROCEDURE: Procedure, benefits, and risks were discussed with the patient, including alternatives. Patient's questions were answered. Written informed consent was obtained. Timeout protocol followed. Patient placed prone. L4-L5 disc space was localized under fluoroscopy. Skin prepped and draped in usual sterile fashion. Skin and soft tissues anesthetized with 2 mL of 1% lidocaine. 22 gauge needle was advanced into the spinal canal where clear colorless CSF was encountered. 11 mL of CSF was obtained in 4 tubes for requested analysis. Procedure tolerated very well by patient without immediate complication. IMPRESSION: Successful fluoro guided lumbar puncture as above. Electronically Signed   By: Ulyses Southward M.D.   On: 05/02/2021 19:30    Scheduled Meds:  dicyclomine  20 mg Oral TID AC & HS   heparin  5,000 Units Subcutaneous Q8H   insulin aspart  0-15 Units Subcutaneous TID WC   insulin aspart  0-5 Units Subcutaneous QHS   insulin glargine-yfgn  10 Units Subcutaneous Daily   levothyroxine  50 mcg Oral Q0600   risperiDONE  1 mg Oral BID   venlafaxine XR  75 mg Oral Daily   Continuous Infusions:   LOS: 0 days   Time spent: 40 mins   Romel Dumond Laural Benes, MD How to contact the [facility] Attending or Consulting provider 7A - 7P or covering provider during after hours 7P -7A, for this patient?  Check the care team in [facility] and look for a) attending/consulting [facility] provider listed and b) the [facility] team listed Log into [email or web address] and use [place]'s universal password to access. If you do not have the password, please contact the  hospital operator. Locate the [facility] provider you are looking for under [facility] and page to a number that you can be directly reached. If you still have difficulty reaching the provider, please page the [facility] (Director on Call) for the Hospitalists listed on amion for assistance.  05/03/2021, 11:11 AM

## 2021-05-04 ENCOUNTER — Observation Stay (HOSPITAL_COMMUNITY): Payer: Medicare HMO

## 2021-05-04 DIAGNOSIS — G610 Guillain-Barre syndrome: Secondary | ICD-10-CM | POA: Diagnosis not present

## 2021-05-04 DIAGNOSIS — E1165 Type 2 diabetes mellitus with hyperglycemia: Secondary | ICD-10-CM | POA: Diagnosis not present

## 2021-05-04 DIAGNOSIS — R29898 Other symptoms and signs involving the musculoskeletal system: Secondary | ICD-10-CM | POA: Diagnosis not present

## 2021-05-04 DIAGNOSIS — Z794 Long term (current) use of insulin: Secondary | ICD-10-CM | POA: Diagnosis not present

## 2021-05-04 DIAGNOSIS — E854 Organ-limited amyloidosis: Secondary | ICD-10-CM | POA: Diagnosis not present

## 2021-05-04 LAB — BASIC METABOLIC PANEL
Anion gap: 7 (ref 5–15)
BUN: 22 mg/dL (ref 8–23)
CO2: 24 mmol/L (ref 22–32)
Calcium: 8.3 mg/dL — ABNORMAL LOW (ref 8.9–10.3)
Chloride: 106 mmol/L (ref 98–111)
Creatinine, Ser: 0.94 mg/dL (ref 0.44–1.00)
GFR, Estimated: >60 mL/min (ref >60)
Glucose, Bld: 208 mg/dL — ABNORMAL HIGH (ref 70–99)
Potassium: 4.4 mmol/L (ref 3.5–5.1)
Sodium: 137 mmol/L (ref 135–145)

## 2021-05-04 LAB — GLUCOSE, CAPILLARY
Glucose-Capillary: 197 mg/dL — ABNORMAL HIGH (ref 70–99)
Glucose-Capillary: 235 mg/dL — ABNORMAL HIGH (ref 70–99)
Glucose-Capillary: 191 mg/dL — ABNORMAL HIGH (ref 70–99)
Glucose-Capillary: 250 mg/dL — ABNORMAL HIGH (ref 70–99)

## 2021-05-04 LAB — MAGNESIUM: Magnesium: 2.4 mg/dL (ref 1.7–2.4)

## 2021-05-04 IMAGING — DX DG CHEST 1V PORT
1 series · 1 of 1 positions shown · non-contrast
Comparison: None.

CLINICAL DATA: Hypoxia

EXAM:
PORTABLE CHEST 1 VIEW

[chest ap]
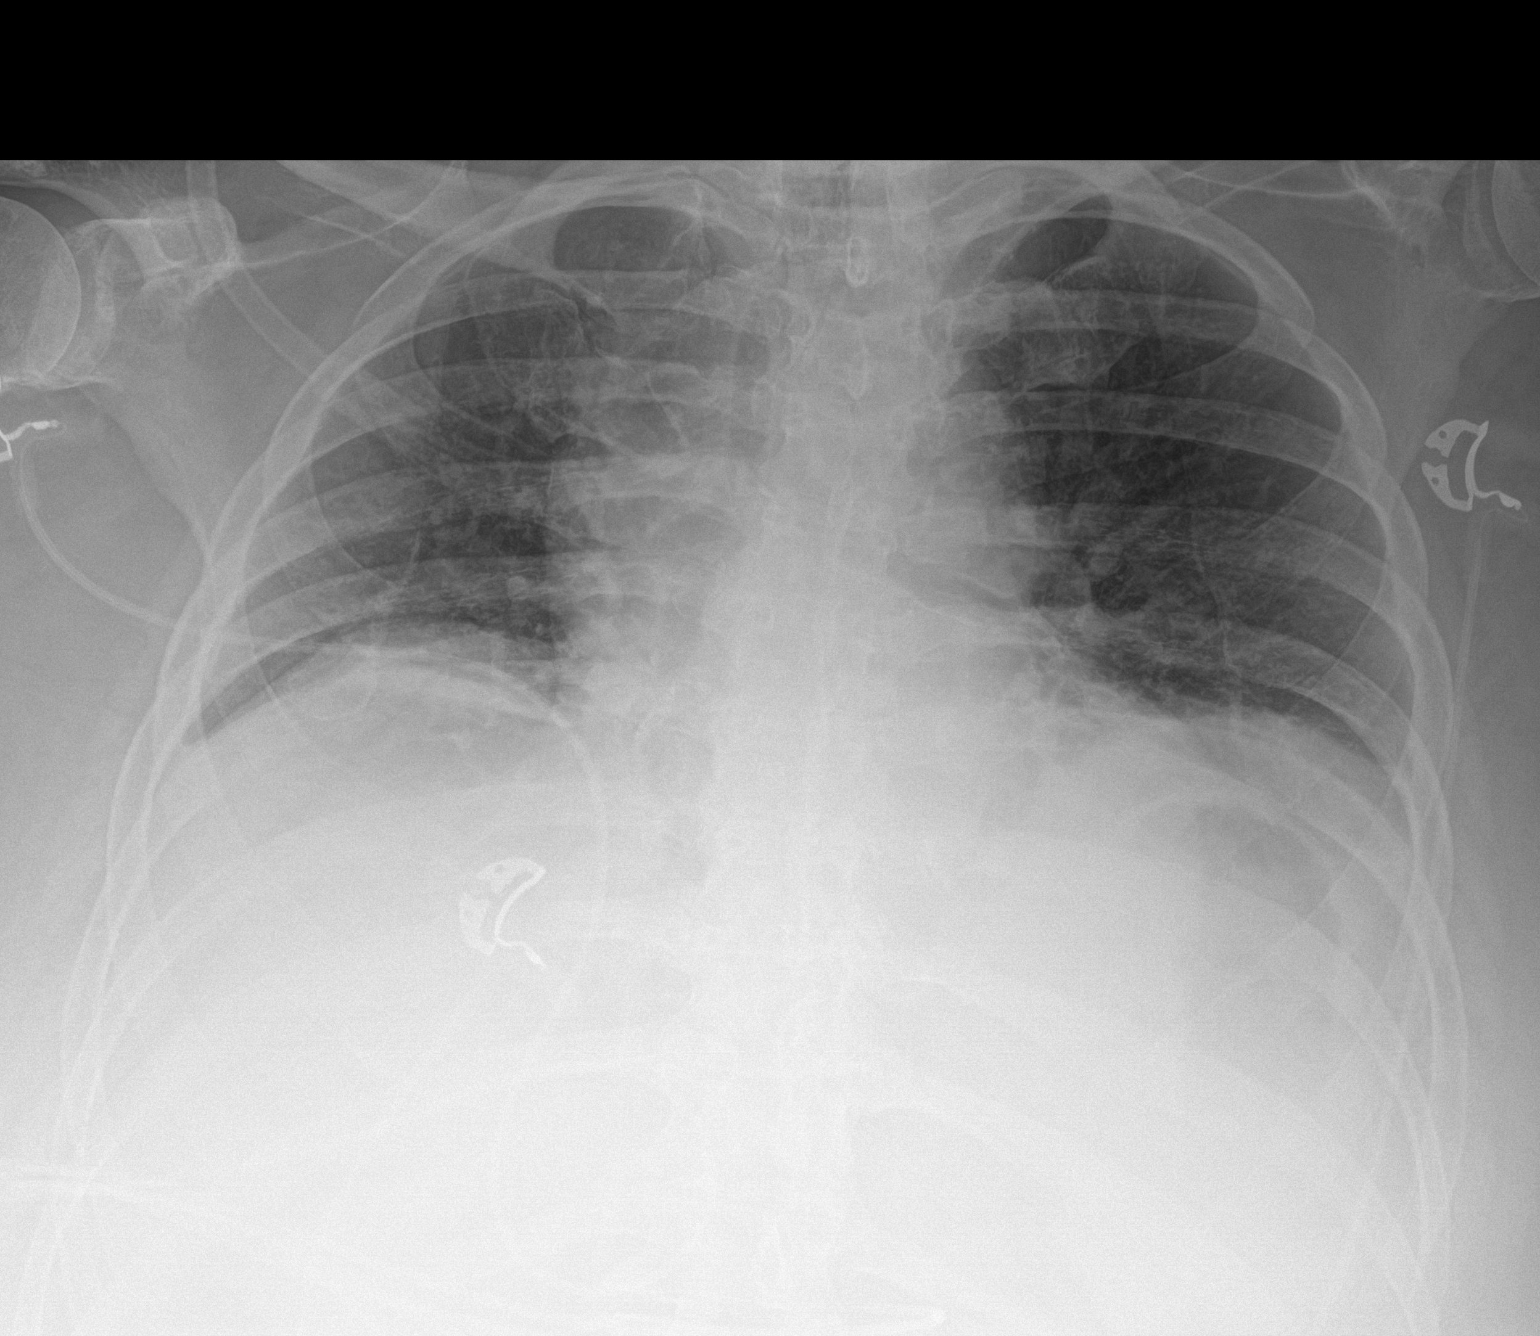

[1 of 1 positions shown; findings below may reference images not displayed]

FINDINGS: Mildly enlarged cardiomediastinal silhouette. Low lung volumes.
Bibasilar subsegmental axis. No large effusion or visible
pneumothorax. No acute osseous abnormality.
IMPRESSION: Low lung volumes with bibasilar atelectasis.

## 2021-05-04 MED ORDER — METOPROLOL TARTRATE 12.5 MG HALF TABLET
12.5000 mg | ORAL_TABLET | Freq: Two times a day (BID) | ORAL | Status: DC
  Administered 2021-05-04 – 2021-05-12 (×18): 12.5 mg via ORAL
  Filled 2021-05-04 (×18): qty 1

## 2021-05-04 NOTE — Progress Notes (Signed)
History as documented by my colleague confirmed with patient and family at bedside.   Notably today she has had multiple episodes of incontinence.  She reports normal sensation in the genital or anal area, but notes that she is scared to strain since that led to her vasovagal episode at [facility]. She feels that she might be a little bit weaker today with some slight weakness in her arms  General examination is notable for patient on oxygen.  On 3 L her O2 sat is above 95% but when oxygen is turned off her saturation drops to 89 to 90%.  She remains comfortable and asymptomatic laying in bed  On examination, her mental status is intact, her cranial nerves are intact but she does have masked facies and is somewhat hypophonic.  She does not have any new neck pain.  Confrontational strength testing to me in the upper extremities remains 5 out of 5.  She does not lift her legs when they are straight antigravity but with the knee bent she can flex her hip and give me at least 4- out of 5.  Her reflexes remain intact and I am able to obtain a slight right plantar reflex though not on the left.  Her rectal tone is intact as is her sensation.  She is able to squeeze.  Liquid stool is present but I also feel harder stool within the rectal vault  Discussed with primary team, new oxygen requirement is concerning and may be contributing to her feeling weak.  I have high concern for functional overlay on her examination which may be driven by her underlying hypoxia or other medical problem.  Additionally would not feel comfortable administering IVIG in case there is etiology such as PE for her new hypoxia.  Query whether there is some constipation with overflow incontinence as well or other etiology for her incontinence.  It does not seem to be primarily neurological given good rectal tone, squeezing sensation.  Brooke Dare MD-PhD [facility] [phone] Available 7 AM to 7 PM, outside  these hours please contact Neurologist on call listed on AMION   No charge same day note

## 2021-05-04 NOTE — Plan of Care (Signed)
Neurology phone call note  NP called and spoke to patient's son to get further history.  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 She only goes to doctor appointments and sometimes cancels them due to fatigue. She c/o being tired constantly. She is independent with ADLs. She does not drive. Family brings her groceries. She does not eat well. Son states that she tells him she "passes out" sometimes, but no one has seen this.   She had COVID and influenza vaccine on the 9th and symptoms started the next day. He knows of no recent illness, cold, flu, n/v/d.  Son knows of no previous event like this one. She c/o stomach hurting and MD thought it may be an ulcer, but she was not put on medication. Son says she takes a lot of Tums. Son does not know of any cardiac disease. With her history of amyloidosis, hematology suggested multiple myeloma panel, and approved IVIG if needed.    Son is not aware of stroke in past, brain surgery, brain bleed, or TBI. She does not have seizures, but her passing out in the past could possibly be seizures. Patient had a vasovagal event 2 days ago when having a BM here, witnessed by RN.   NP explained to son, that we continue to evaluate her LE weakness. Explained to son about IVIG, but informed him we will not use it unless we think this is truly IVIG.   Conversation was shared with Dr. Curly [place]ores.   Clance Boll, MSN, APN-BC Neurology Nurse Practitioner Pager [phone]

## 2021-05-04 NOTE — Progress Notes (Signed)
Physical Therapy Treatment Patient Details Name: Sheryl Henry MRN: [id number] DOB: 03-24-1956 Today's Date: 05/04/2021   History of Present Illness Sheryl Henry  is a 65 y.o. female, with history of AL cutaneous amyloidosis, diabetes mellitus type 2 and hypertension. 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    PT Comments    Patient seems to have more weakness and dependence with mobility today compared to notes from initial evaluation. No active ankle dorsiflexion, able press against minimal resistance into plantarflexion, Rt composite grip weaker than Left but both diminished. Required mod to max assist to stand from EOB today (able to take steps previously but not possible today.) Denies sensory changes, light touch in LEs intact. + loose Bowel incontinence. Patient will continue to benefit from skilled physical therapy services to further improve independence with functional mobility.   Recommendations for follow up therapy are one component of a multi-disciplinary discharge planning process, led by the attending physician.  Recommendations may be updated based on patient status, additional functional criteria and insurance authorization.  Follow Up Recommendations  Skilled nursing-short term rehab (<3 hours/day)     Assistance Recommended at Discharge Frequent or constant Supervision/Assistance  Equipment Recommendations  None recommended by PT    Recommendations for Other Services       Precautions / Restrictions Precautions Precautions: Fall Restrictions Weight Bearing Restrictions: No     Mobility  Bed Mobility Overal bed mobility: Needs Assistance Bed Mobility: Rolling;Sidelying to Sit;Sit to Sidelying Rolling: Min guard Sidelying to sit: Mod assist     Sit to sidelying: Mod assist General bed mobility comments: Educated on rolling in bed while assisting with peri-care due to episode of bowel incontinence. Pt able  to roll with VC but effortful. Mod assist for trunk to rise out of bed, very effortful. Able to lie back down onto Rt shoulder into sidelying position but required Mod assist to bring LEs into bed.    Transfers Overall transfer level: Needs assistance Equipment used: Rolling walker (2 wheels) Transfers: Sit to/from Stand Sit to Stand: Max assist           General transfer comment: Performed sit<>stand training from slightly elevated bed position. First trial pt required mod assist and tolerated for <15 seconds, cues for upright posture, knee and hip extension with hand placement for support on RW. Second attempt pt required Max assist and stood <10 second with inability to fully extend knees or shift weight anteriorly over RW for UE support.    Ambulation/Gait                   Stairs             Wheelchair Mobility    Modified Rankin (Stroke Patients Only)       Balance Overall balance assessment: Needs assistance Sitting-balance support: Feet supported;No upper extremity supported Sitting balance-Leahy Scale: Fair     Standing balance support: Bilateral upper extremity supported;Reliant on assistive device for balance Standing balance-Leahy Scale: Poor                              Cognition Arousal/Alertness: Awake/alert Behavior During Therapy: WFL for tasks assessed/performed Overall Cognitive Status: Within Functional Limits for tasks assessed  Exercises      General Comments General comments (skin integrity, edema, etc.): Bowel incontinence x2. Pt without active ankle DF, inability to SLR - trace movement on Rt in supine. Rt hand squeeze notably weaker than Lt but noted bil.      Pertinent Vitals/Pain Pain Assessment: 0-10 Pain Score: 9  Pain Location: back Pain Descriptors / Indicators: Constant;Discomfort;Grimacing;Guarding Pain Intervention(s): Monitored during  session;Repositioned    Home Living                          Prior Function            PT Goals (current goals can now be found in the care plan section) Acute Rehab PT Goals Patient Stated Goal: Return home PT Goal Formulation: With patient Time For Goal Achievement: 05/16/21 Potential to Achieve Goals: Good Progress towards PT goals: Progressing toward goals    Frequency    Min 3X/week      PT Plan Current plan remains appropriate    Co-evaluation              AM-PAC PT "6 Clicks" Mobility   Outcome Measure  Help needed turning from your back to your side while in a flat bed without using bedrails?: A Little Help needed moving from lying on your back to sitting on the side of a flat bed without using bedrails?: A Lot Help needed moving to and from a bed to a chair (including a wheelchair)?: Total Help needed standing up from a chair using your arms (e.g., wheelchair or bedside chair)?: Total Help needed to walk in hospital room?: Total   6 Click Score: 8    End of Session Equipment Utilized During Treatment: Gait belt Activity Tolerance: Patient limited by fatigue Patient left: in bed;with call bell/phone within reach;with bed alarm set Nurse Communication: Mobility status (Having episodes of bowel incontinence.) PT Visit Diagnosis: Unsteadiness on feet (R26.81);Other abnormalities of gait and mobility (R26.89);Muscle weakness (generalized) (M62.81);Difficulty in walking, not elsewhere classified (R26.2);Pain Pain - part of body:  (back)     Time: 3016-0109 PT Time Calculation (min) (ACUTE ONLY): 29 min  Charges:  $Therapeutic Activity: 23-37 mins                     Kathlyn [place], PT, DPT   [place] 05/04/2021, 3:25 PM

## 2021-05-04 NOTE — Progress Notes (Signed)
Patient gave good effort with VC and NIF exercises.  VC 1.7 and NIF -25.

## 2021-05-04 NOTE — Progress Notes (Addendum)
[facility] PROGRESS NOTE   Sheryl Henry GOT:[id number] DOB: [birth year] DOA: 05/01/2021  PCP: [name], NP  Brief History/Interval Summary: 65 y.o. female, with history of diabetes mellitus type 2 and hypertension presented to the ED with a chief complaint of ringing in her head.  Patient reports that the day prior to presentation she was totally at her normal state of health.  The day prior to that she heard a high-volume sound in her head and felt like it made her whole head vibrate.  After that she had barely any sleep all night.  Symptoms seem to resolve and then the day of presentation it happened again.  Around lunchtime she had an intense feeling in her head.  She does not characterize it as pain, but cannot describe the sensation.  She laid down to take a nap at 2:00.  When she woke up she had the high-volume sound in her head again.  She reports that it happened so fast she cannot describe the characteristics of it.  He goes "through her head" but she cannot describe where it starts and finishes.  She says that the feeling that she has after that is like her brain needs a repeat.  It is an intense feeling and she does not know how to make it go away.  She knows for sure the soundwave brings it on.  The feeling that her brain needs to remove it is in both of her temples.  She again says that its not the pain.  Per patient, this soundwave is her main concern.  Son had told the ER that she has had this before and she has been off some of her psych medications.  They were planning to discharge her home with follow-up to psychiatry, patient then announced that she has not been able to walk all day.  She reports she feels like she is staggering.  She does not feel dizzy or like the room is spinning.   Her LE weakness worsened after receiving the flu and covid vaccines recently.    Reason for Visit: Lower extremity weakness, bilateral  Consultants: Neurology  Procedures: Lumbar  puncture    Subjective/Interval History: Patient is a poor historian tells me that she is still unable to move her legs.  She is usually independent at baseline.  Denies any chest pain shortness of breath.  No nausea vomiting.  No headaches currently     Assessment/Plan:  Bilateral lower extremity weakness Concern for Guillain-Barr syndrome due to symptom onset after she was given COVID and influenza vaccine recently. Neurology was consulted and is following.  Patient underwent MRI of the brain and thoracic and lumbar spine which did not show any acute findings that could account for her weakness.  Underwent MR venogram which did not show any sinus thrombosis. Patient underwent lumbar puncture which showed 2 WBC, protein was 54 which is elevated.  Glucose was also 120 which was elevated. Continue with vital capacity and NIF measurements.  Respiratory status noted to be stable currently. Neurology is contemplating IVIG.  Discussed with Dr. Curly Shores today.  They are concerned about patient's history of AL amyloidosis with using IVIG.  Discussed with medical oncology, Dr. Irene Limbo, who does not feel that there is any contraindication to IVIG if it is indicated.  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  Continue SSI.  Monitor CBGs.  She is on glargine 10 units daily.  AL amyloidosis, cutaneous Followed by Dr. Marcell Anger  at [facility].  Appears to be primarily cutaneous amyloidosis based on his notes from December 2021.  Currently not on any systemic treatment.  Previously has been on thalidomide, Revlimid, melphalan.  Not on treatment since 2016. As mentioned above this was discussed with Dr. Irene Limbo.  He recommends ordering multiple myeloma panel along with light chains  Hypothyroidism Continue levothyroxine.  History of migraine headaches. Stable currently.  Was given migraine cocktail on 11/11.  Obesity Estimated body mass index is 36.91 kg/m as  calculated from the following:   Height as of this encounter: 5' 7"  (1.702 m).   Weight as of this encounter: 106.9 kg.    DVT Prophylaxis: Subcutaneous heparin Code Status: Full code Family Communication: Discussed with the patient.  No family at bedside Disposition Plan: Hopefully return home when improved.  PT and OT evaluation.  Status is: Observation  The patient will require care spanning > 2 midnights and should be moved to inpatient because: Inability to ambulate, concern for Guillain-Barr syndrome.     Medications: Scheduled:  dicyclomine  20 mg Oral TID AC & HS   heparin  5,000 Units Subcutaneous Q8H   insulin aspart  0-15 Units Subcutaneous TID WC   insulin aspart  0-5 Units Subcutaneous QHS   insulin aspart  5 Units Subcutaneous TID WC   insulin glargine-yfgn  10 Units Subcutaneous Daily   levothyroxine  50 mcg Oral Q0600   risperiDONE  1 mg Oral BID   venlafaxine XR  75 mg Oral Daily   Continuous: [id number] **OR** acetaminophen (TYLENOL) oral liquid 160 mg/5 mL **OR** acetaminophen, oxyCODONE-acetaminophen, senna-docusate  Antibiotics: Anti-infectives (From admission, onward)    None       Objective:  Vital Signs  Vitals:   05/03/21 1943 05/03/21 2344 05/04/21 0410 05/04/21 0843  BP: 96/77 (!) 116/58 (!) 146/73 (!) 144/73  Pulse: 95 94 99 (!) 103  Resp: 18 18 18 18   Temp: 98.2 F (36.8 C) 98.3 F (36.8 C) 98.9 F (37.2 C) 100.2 F (37.9 C)  TempSrc: Oral Oral Oral Oral  SpO2: 94% 94% 97% 94%  Weight:      Height:       No intake or output data in the 24 hours ending 05/04/21 0935 Filed Weights   05/01/21 1620 05/02/21 0222  Weight: 81.6 kg 106.9 kg    General appearance: Awake alert.  In no distress Resp: Clear to auscultation bilaterally.  Normal effort Cardio: S1-S2 is normal regular.  No S3-S4.  No rubs murmurs or bruit GI: Abdomen is soft.  Nontender nondistended.  Bowel sounds are present normal.  No masses  organomegaly Extremities: No edema.  Full range of motion of lower extremities. Neurologic: Alert and oriented x3.  Inability to raise either of her legs off the bed.  Able to move them sideways.   Lab Results:  Data Reviewed: I have personally reviewed following labs and imaging studies  CBC: Recent Labs  Lab 05/01/21 1722  WBC 8.4  NEUTROABS 5.8  HGB 16.6*  HCT 46.0  MCV 88.3  PLT 056    Basic Metabolic Panel: Recent Labs  Lab 05/01/21 1722  NA 139  K 4.0  CL 102  CO2 28  GLUCOSE 206*  BUN 15  CREATININE 0.63  CALCIUM 8.8*  MG 2.1    GFR: Estimated Creatinine Clearance: 89.4 mL/min (by C-G formula based on SCr of 0.63 mg/dL).  Liver Function Tests: Recent Labs  Lab 05/01/21 1722  AST 34  ALT 28  ALKPHOS 84  BILITOT 0.8  PROT 7.9  ALBUMIN 4.2      HbA1C: Recent Labs    05/02/21 0517  HGBA1C 7.3*    CBG: Recent Labs  Lab 05/03/21 0755 05/03/21 1122 05/03/21 1716 05/03/21 2102 05/04/21 0644  GLUCAP 194* 230* 186* 160* 197*    Lipid Profile: Recent Labs    05/02/21 0517  CHOL 185  HDL 49  LDLCALC 109*  TRIG 137  CHOLHDL 3.8    Thyroid Function Tests: Recent Labs    05/01/21 1722  TSH 3.221      Recent Results (from the past 240 hour(s))  Resp Panel by RT-PCR (Flu A&B, Covid) Urine, Clean Catch     Status: None   Collection Time: 05/02/21 12:06 AM   Specimen: Urine, Clean Catch; Nasopharyngeal(NP) swabs in vial transport medium  Result Value Ref Range Status   SARS Coronavirus 2 by RT PCR NEGATIVE NEGATIVE Final    Comment: (NOTE) SARS-CoV-2 target nucleic acids are NOT DETECTED.  The SARS-CoV-2 RNA is generally detectable in upper respiratory specimens during the acute phase of infection. The lowest concentration of SARS-CoV-2 viral copies this assay can detect is 138 copies/mL. A negative result does not preclude SARS-Cov-2 infection and should not be used as the sole basis for treatment or other patient  management decisions. A negative result may occur with  improper specimen collection/handling, submission of specimen other than nasopharyngeal swab, presence of viral mutation(s) within the areas targeted by this assay, and inadequate number of viral copies(<138 copies/mL). A negative result must be combined with clinical observations, patient history, and epidemiological information. The expected result is Negative.  Fact Sheet for Patients:  EntrepreneurPulse.com.au  Fact Sheet for Healthcare Providers:  IncredibleEmployment.be  This test is no t yet approved or cleared by the [place] FDA and  has been authorized for detection and/or diagnosis of SARS-CoV-2 by FDA under an Emergency Use Authorization (EUA). This EUA will remain  in effect (meaning this test can be used) for the duration of the COVID-19 declaration under Section 564(b)(1) of the Act, 21 U.S.C.section 360bbb-3(b)(1), unless the authorization is terminated  or revoked sooner.       Influenza A by PCR NEGATIVE NEGATIVE Final   Influenza B by PCR NEGATIVE NEGATIVE Final    Comment: (NOTE) The Xpert Xpress SARS-CoV-2/FLU/RSV plus assay is intended as an aid in the diagnosis of influenza from Nasopharyngeal swab specimens and should not be used as a sole basis for treatment. Nasal washings and aspirates are unacceptable for Xpert Xpress SARS-CoV-2/FLU/RSV testing.  Fact Sheet for Patients: EntrepreneurPulse.com.au  Fact Sheet for Healthcare Providers: IncredibleEmployment.be  This test is not yet approved or cleared by the [place] FDA and has been authorized for detection and/or diagnosis of SARS-CoV-2 by FDA under an Emergency Use Authorization (EUA). This EUA will remain in effect (meaning this test can be used) for the duration of the COVID-19 declaration under Section 564(b)(1) of the Act, 21 U.S.C. section 360bbb-3(b)(1),  unless the authorization is terminated or revoked.  Performed at [facility], [address] [place], [place] [address]   MRSA Next Gen by PCR, Nasal     Status: None   Collection Time: 05/02/21  9:11 PM   Specimen: Nasal Mucosa; Nasal Swab  Result Value Ref Range Status   MRSA by PCR Next Gen NOT DETECTED NOT DETECTED Final    Comment: (NOTE) The GeneXpert MRSA Assay (FDA approved for NASAL specimens only), is one component of a comprehensive  MRSA colonization surveillance program. It is not intended to diagnose MRSA infection nor to guide or monitor treatment for MRSA infections. Test performance is not FDA approved in patients less than 4 years old. Performed at [facility], [address] [place], [place] [address]       Radiology Studies: MR ANGIO HEAD WO CONTRAST  Result Date: 05/02/2021 CLINICAL DATA:  Headache, new or worsening (Age >= 50y) EXAM: MR VENOGRAM HEAD WITHOUT CONTRAST MR ANGIOGRAPHY HEAD WITHOUT CONTRAST TECHNIQUE: Angiographic images of the intracranial venous structures were acquired using MRV technique without intravenous contrast. Angiographic images of the Circle of Willis were acquired using MRA technique without intravenous contrast. COMPARISON:  No pertinent prior exam. FINDINGS: MRA head: Intracranial internal carotid arteries are patent. There is a 1.5 mm inferiorly directed outpouching from the distal supraclinoid right ICA. There is likely a small vessel arising from this. Included anterior and middle cerebral arteries are patent. Intracranial vertebral arteries are patent. Basilar artery is patent. Major cerebellar artery origins are patent. Posterior cerebral arteries are patent. MRV head: Superior sagittal sinus, straight sinus, vein of Galen, internal cerebral veins, and basal veins are patent. Both transverse and sigmoid sinuses are patent. Right transverse sinus is dominant. IMPRESSION: No proximal intracranial vessel occlusion. No evidence of dural  sinus thrombosis. 1.5 mm outpouching from distal supraclinoid right ICA likely represents an infundibulum rather than aneurysm. Electronically Signed   By: Macy Mis M.D.   On: 05/02/2021 16:15   MR BRAIN WO CONTRAST  Result Date: 05/02/2021 CLINICAL DATA:  Neuro deficit, acute, stroke suspected EXAM: MRI HEAD WITHOUT CONTRAST TECHNIQUE: Multiplanar, multiecho pulse sequences of the brain and surrounding structures were obtained without intravenous contrast. COMPARISON:  None. FINDINGS: Brain: No acute infarction, hemorrhage, hydrocephalus, extra-axial collection or mass lesion. Mild for age scattered T2 hyperintensities in the supratentorial and pontine white matter, nonspecific but compatible with chronic microvascular disease. Mild atrophy. Vascular: Major arterial flow voids are maintained at the skull base. Skull and upper cervical spine: Normal marrow signal. Sinuses/Orbits: Clear sinuses.  Unremarkable orbits. Other: No mastoid effusions. IMPRESSION: 1. No evidence of acute intracranial abnormality, including acute infarct. 2. 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 Electronically Signed   By: Margaretha Sheffield M.D.   On: 05/02/2021 11:34   MR THORACIC SPINE WO CONTRAST  Result Date: 05/02/2021 CLINICAL DATA:  Ataxia, nontraumatic, T-spine pathology suspected; Myelopathy, acute or progressive Low back pain, cauda equina syndrome suspected EXAM: MRI THORACIC AND LUMBAR SPINE WITHOUT CONTRAST TECHNIQUE: Multiplanar and multiecho pulse sequences of the thoracic and lumbar spine were obtained without intravenous contrast. COMPARISON:  None. FINDINGS: MRI THORACIC SPINE FINDINGS Mildly motion limited study. Alignment: No substantial sagittal subluxation. Mild kyphosis at T10-T11. Vertebrae: Vertebral body heights are maintained. No focal marrow edema to suggest acute fracture or discitis/osteomyelitis. Heterogeneous bone marrow without suspicious bone lesion. Cord: Mildly motion  limited evaluation without evidence of abnormal cord signal or abnormal cord morphology. Paraspinal and other soft tissues: Unremarkable. Disc levels: Small disc bulge at T10-T11 with ligamentum flavum thickening and facet hypertrophy at this level. Resulting mild canal stenosis. Otherwise, no significant canal stenosis. Multilevel facet arthropathy moderate foraminal stenosis on the right at T1-T2 and the left at T8-T9. Otherwise, multilevel mild-to-moderate foraminal stenosis. MRI LUMBAR SPINE FINDINGS Segmentation:  Standard. Alignment:  No substantial sagittal subluxation. Vertebrae: Vertebral body heights are maintained. Degenerative/discogenic endplate signal changes about the right eccentric L2-L3 disc. Otherwise, no focal marrow edema to suggest acute fracture discitis/osteomyelitis. Heterogeneous bone marrow  without suspicious bone lesion. Conus medullaris and cauda equina: Conus extends to the T12-L1 level. Conus appears normal. Paraspinal and other soft tissues: Unremarkable. Disc levels: T12-L1: Mild disc bulging without significant stenosis. L1-L2: Mild disc bulging and bilateral facet arthropathy without significant stenosis. L2-L3: Left eccentric disc height loss. Left eccentric disc bulging and endplate spurring. Bilateral facet arthropathy. Resulting mild to moderate canal and left greater than right subarticular recess stenosis. Mild-to-moderate left foraminal stenosis with left far lateral disc closely approximating the exiting left L2 nerve. L3-L4: Broad disc bulge with ligamentum flavum thickening and right greater than left facet arthropathy. Resulting moderate canal stenosis with moderate to severe right subarticular recess and foraminal stenosis. Mild left foraminal stenosis. L4-L5: Broad disc bulge with ligamentum flavum thickening and bilateral facet arthropathy. Resulting mild to moderate canal and bilateral subarticular recess stenosis. Mild bilateral foraminal stenosis. L5-S1: Disc  bulging with bilateral facet arthropathy. No significant canal or foraminal stenosis. IMPRESSION: MR THORACIC SPINE IMPRESSION 1. Moderate foraminal stenosis on the right at T1-T2 and the left at T8-T9. Otherwise, multilevel mild-to-moderate foraminal stenosis. 2. Mild canal stenosis at T10-T11. MR LUMBAR SPINE IMPRESSION 1. At L3-L4, moderate to severe right subarticular recess and foraminal stenosis with moderate canal stenosis. 2. At the L2-L3, mild-to-moderate canal and left greater than right subarticular recess stenosis with mild-to-moderate left foraminal stenosis and left far lateral disc closely approximating the exiting left L2 nerve. 3. At L4-L5, mild-to-moderate canal and bilateral subarticular recess stenosis with mild bilateral foraminal stenosis. Electronically Signed   By: Margaretha Sheffield M.D.   On: 05/02/2021 12:03   MR LUMBAR SPINE WO CONTRAST  Result Date: 05/02/2021 CLINICAL DATA:  Ataxia, nontraumatic, T-spine pathology suspected; Myelopathy, acute or progressive Low back pain, cauda equina syndrome suspected EXAM: MRI THORACIC AND LUMBAR SPINE WITHOUT CONTRAST TECHNIQUE: Multiplanar and multiecho pulse sequences of the thoracic and lumbar spine were obtained without intravenous contrast. COMPARISON:  None. FINDINGS: MRI THORACIC SPINE FINDINGS Mildly motion limited study. Alignment: No substantial sagittal subluxation. Mild kyphosis at T10-T11. Vertebrae: Vertebral body heights are maintained. No focal marrow edema to suggest acute fracture or discitis/osteomyelitis. Heterogeneous bone marrow without suspicious bone lesion. Cord: Mildly motion limited evaluation without evidence of abnormal cord signal or abnormal cord morphology. Paraspinal and other soft tissues: Unremarkable. Disc levels: Small disc bulge at T10-T11 with ligamentum flavum thickening and facet hypertrophy at this level. Resulting mild canal stenosis. Otherwise, no significant canal stenosis. Multilevel facet  arthropathy moderate foraminal stenosis on the right at T1-T2 and the left at T8-T9. Otherwise, multilevel mild-to-moderate foraminal stenosis. MRI LUMBAR SPINE FINDINGS Segmentation:  Standard. Alignment:  No substantial sagittal subluxation. Vertebrae: Vertebral body heights are maintained. Degenerative/discogenic endplate signal changes about the right eccentric L2-L3 disc. Otherwise, no focal marrow edema to suggest acute fracture discitis/osteomyelitis. Heterogeneous bone marrow without suspicious bone lesion. Conus medullaris and cauda equina: Conus extends to the T12-L1 level. Conus appears normal. Paraspinal and other soft tissues: Unremarkable. Disc levels: T12-L1: Mild disc bulging without significant stenosis. L1-L2: Mild disc bulging and bilateral facet arthropathy without significant stenosis. L2-L3: Left eccentric disc height loss. Left eccentric disc bulging and endplate spurring. Bilateral facet arthropathy. Resulting mild to moderate canal and left greater than right subarticular recess stenosis. Mild-to-moderate left foraminal stenosis with left far lateral disc closely approximating the exiting left L2 nerve. L3-L4: Broad disc bulge with ligamentum flavum thickening and right greater than left facet arthropathy. Resulting moderate canal stenosis with moderate to severe right subarticular recess and foraminal stenosis.  Mild left foraminal stenosis. L4-L5: Broad disc bulge with ligamentum flavum thickening and bilateral facet arthropathy. Resulting mild to moderate canal and bilateral subarticular recess stenosis. Mild bilateral foraminal stenosis. L5-S1: Disc bulging with bilateral facet arthropathy. No significant canal or foraminal stenosis. IMPRESSION: MR THORACIC SPINE IMPRESSION 1. Moderate foraminal stenosis on the right at T1-T2 and the left at T8-T9. Otherwise, multilevel mild-to-moderate foraminal stenosis. 2. Mild canal stenosis at T10-T11. MR LUMBAR SPINE IMPRESSION 1. At L3-L4, moderate  to severe right subarticular recess and foraminal stenosis with moderate canal stenosis. 2. At the L2-L3, mild-to-moderate canal and left greater than right subarticular recess stenosis with mild-to-moderate left foraminal stenosis and left far lateral disc closely approximating the exiting left L2 nerve. 3. At L4-L5, mild-to-moderate canal and bilateral subarticular recess stenosis with mild bilateral foraminal stenosis. Electronically Signed   By: Margaretha Sheffield M.D.   On: 05/02/2021 12:03   MR MRV HEAD WO CM  Result Date: 05/02/2021 CLINICAL DATA:  Headache, new or worsening (Age >= 50y) EXAM: MR VENOGRAM HEAD WITHOUT CONTRAST MR ANGIOGRAPHY HEAD WITHOUT CONTRAST TECHNIQUE: Angiographic images of the intracranial venous structures were acquired using MRV technique without intravenous contrast. Angiographic images of the Circle of Willis were acquired using MRA technique without intravenous contrast. COMPARISON:  No pertinent prior exam. FINDINGS: MRA head: Intracranial internal carotid arteries are patent. There is a 1.5 mm inferiorly directed outpouching from the distal supraclinoid right ICA. There is likely a small vessel arising from this. Included anterior and middle cerebral arteries are patent. Intracranial vertebral arteries are patent. Basilar artery is patent. Major cerebellar artery origins are patent. Posterior cerebral arteries are patent. MRV head: Superior sagittal sinus, straight sinus, vein of Galen, internal cerebral veins, and basal veins are patent. Both transverse and sigmoid sinuses are patent. Right transverse sinus is dominant. IMPRESSION: No proximal intracranial vessel occlusion. No evidence of dural sinus thrombosis. 1.5 mm outpouching from distal supraclinoid right ICA likely represents an infundibulum rather than aneurysm. Electronically Signed   By: Macy Mis M.D.   On: 05/02/2021 16:15   DG FL GUIDED LUMBAR PUNCTURE  Result Date: 05/02/2021 CLINICAL DATA:   Cutaneous amyloidosis, headache, ataxia EXAM: DIAGNOSTIC LUMBAR PUNCTURE UNDER FLUOROSCOPIC GUIDANCE COMPARISON:  CT head 05/01/2021, MR head 05/02/2021 FLUOROSCOPY TIME:  Fluoroscopy Time:  0 minutes 24 seconds Radiation Exposure Index (if provided by the fluoroscopic device): 8.70 mGy Number of Acquired Spot Images: 1 PROCEDURE: Procedure, benefits, and risks were discussed with the patient, including alternatives. Patient's questions were answered. Written informed consent was obtained. Timeout protocol followed. Patient placed prone. L4-L5 disc space was localized under fluoroscopy. Skin prepped and draped in usual sterile fashion. Skin and soft tissues anesthetized with 2 mL of 1% lidocaine. 22 gauge needle was advanced into the spinal canal where clear colorless CSF was encountered. 11 mL of CSF was obtained in 4 tubes for requested analysis. Procedure tolerated very well by patient without immediate complication. IMPRESSION: Successful fluoro guided lumbar puncture as above. Electronically Signed   By: Lavonia Dana M.D.   On: 05/02/2021 19:30       LOS: 0 days   [facility] Pager on [email or web address]  05/04/2021, 9:35 AM

## 2021-05-04 NOTE — Consult Note (Signed)
NEUROLOGY CONSULTATION NOTE   Date of service: May 04, 2021 Patient Name: Sheryl Henry MRN:  [id number] DOB:  December 13, 1955 Reason for consult: "Bilateral lower extremity weakness" Requesting Provider: Lanae Boast, MD _ _ _   _ __   _ __ _ _  __ __   _ __   __ _  History of Present Illness  Sheryl Henry is a 65 y.o. female with PMH significant for cutaneous AL amyloidosis, diabetes, hypertension who presents with 3 episodes of a weird brain sounds that felt like a brain freeze and wobbliness on her feet followed by bilateral lower extremity weakness.  Patient reports that she got flu and COVID shot on Thursday and about 12 hours after the shot, she  was experiencing some achiness, myalgias and was lethargic.  She went to bed at night and woke up in the morning and took her oxycodone around noon.  She reports that around 2 PM,she had sudden intense "brain freeze type sound with pain in temples which she is not able to describe really well.  Felt like it went through her head from the left to the right through her brain and then it resolved.  She had 2 further episodes of these. The third episod was really severe and she noted that when she would try to walk, she was very wobbly on her feet, she was feeling off balance when she was walking earlier. She called 911 and was brought in to the ED at [facility].  She was seen by teleneurology at [facility] and workup with MRI Brain, T, L spine was non revealing. No dural sinus venous thrombosis on MR Venogram head. She had an LP with mildly elevated protein to 54, normal glucose, no leukocytosis.  She was transferred to [facility] for detailed neurological exam and for concern for potential GBS.   ROS   Constitutional Denies weight loss, fever and chills.   HEENT Denies changes in vision and hearing.   Respiratory Denies SOB and cough.   CV Denies palpitations and CP   GI Denies abdominal pain, nausea, vomiting and  diarrhea.   GU Denies dysuria and urinary frequency.   MSK Denies myalgia and joint pain.   Skin Denies rash and pruritus.   Neurological Denies headache and syncope.   Psychiatric Denies recent changes in mood. Denies anxiety and depression.    Past History   Past Medical History:  Diagnosis Date   Cutaneous amyloidosis (HCC)    Diabetes mellitus without complication (HCC)    Hypertension    Past Surgical History:  Procedure Laterality Date   BACK SURGERY     BREAST SURGERY     MANDIBLE SURGERY     History reviewed. No pertinent family history. Social History   Socioeconomic History   Marital status: Divorced    Spouse name: Not on file   Number of children: Not on file   Years of education: Not on file   Highest education level: Not on file  Occupational History   Not on file  Tobacco Use   Smoking status: Never   Smokeless tobacco: Never  Vaping Use   Vaping Use: Never used  Substance and Sexual Activity   Alcohol use: Not Currently   Drug use: Never   Sexual activity: Not Currently  Other Topics Concern   Not on file  Social History Narrative   Not on file   Social Determinants of Health   Financial Resource Strain: Not on  file  Food Insecurity: Not on file  Transportation Needs: Not on file  Physical Activity: Not on file  Stress: Not on file  Social Connections: Not on file   Allergies  Allergen Reactions   Ivp Dye [Iodinated Diagnostic Agents] Swelling    Medications   Medications Prior to Admission  Medication Sig Dispense Refill Last Dose   cetirizine (ZYRTEC) 10 MG tablet Take 1 tablet by mouth daily.   05/01/2021   CINNAMON PO Take 1 capsule by mouth in the morning and at bedtime.   05/01/2021   Coenzyme Q10 (COQ10 PO) Take 1 tablet by mouth daily.   05/01/2021   CRANBERRY PO Take 1 tablet by mouth in the morning and at bedtime.   05/01/2021   dicyclomine (BENTYL) 20 MG tablet Take 20 mg by mouth 4 (four) times daily.   05/01/2021    Insulin Glargine (BASAGLAR KWIKPEN) 100 UNIT/ML Inject 20 Units into the skin daily.   05/01/2021   levothyroxine (SYNTHROID) 50 MCG tablet Take 50 mcg by mouth daily.   05/01/2021   naproxen sodium (ALEVE) 220 MG tablet Take 220 mg by mouth daily as needed (PAIN).   PRN   NON FORMULARY "Circulation and Vein support" MVI            Hesperidin  100 mg Vitamin C 30 mg                                     Rutin  20 mg Flavonoids 720 mg                                 Grape seed extract   40 mg Diosmin  600 mg                                    Pine Bark Extract   40 mg Take 1 capsule by mouth every morning Milagros Loll extract 40 mg   05/01/2021   Omega-3 1000 MG CAPS Take 1 capsule by mouth in the morning and at bedtime.   05/01/2021   oxyCODONE-acetaminophen (PERCOCET/ROXICET) 5-325 MG tablet Take 1 tablet by mouth 4 (four) times daily as needed for moderate pain.   05/01/2021   perindopril (ACEON) 4 MG tablet Take 4 mg by mouth daily.   05/01/2021   venlafaxine XR (EFFEXOR-XR) 75 MG 24 hr capsule Take 75 mg by mouth daily.   05/01/2021     Vitals   Vitals:   05/03/21 1100 05/03/21 1711 05/03/21 1943 05/03/21 2344  BP: (!) 132/59 106/60 96/77 (!) 116/58  Pulse: 94 (!) 107 95 94  Resp: 19 20 18 18   Temp: 98.1 F (36.7 C) 98.7 F (37.1 C) 98.2 F (36.8 C) 98.3 F (36.8 C)  TempSrc: Oral Oral Oral Oral  SpO2: 96% 98% 94% 94%  Weight:      Height:         Body mass index is 36.91 kg/m.  Physical Exam   General: Laying comfortably in bed; in no acute distress.  HENT: Normal oropharynx and mucosa. Normal external appearance of ears and nose.  Neck: Supple, no pain or tenderness  CV: No JVD. No peripheral edema.  Pulmonary: Symmetric Chest rise. Normal respiratory effort.  Abdomen: Soft to touch, non-tender.  Ext:  No cyanosis, edema, or deformity  Skin: No rash. Normal palpation of skin.   Musculoskeletal: Normal digits and nails by inspection. No clubbing.   Neurologic  Examination  Mental status/Cognition: Alert, oriented to self, place, month and year, good attention.  Speech/language: Fluent, comprehension intact, object naming intact, repetition intact.  Cranial nerves:   CN II Pupils equal and reactive to light, no VF deficits    CN III,IV,VI EOM intact, no gaze preference or deviation, no nystagmus    CN V normal sensation in V1, V2, and V3 segments bilaterally    CN VII no asymmetry, no nasolabial fold flattening    CN VIII normal hearing to speech    CN IX & X normal palatal elevation, no uvular deviation    CN XI 5/5 head turn and 5/5 shoulder shrug bilaterally    CN XII midline tongue protrusion    Motor:  Muscle bulk: poor, tone normal, pronator drift none tremor none Mvmt Root Nerve  Muscle Right Left Comments  SA C5/6 Ax Deltoid 5 5   EF C5/6 Mc Biceps 5 5   EE C6/7/8 Rad Triceps 5 5   WF C6/7 Med FCR     WE C7/8 PIN ECU     F Ab C8/T1 U ADM/FDI 4+ 4+   HF L1/2/3 Fem Illopsoas 3 3   KE L2/3/4 Fem Quad 3 3   DF L4/5 D Peron Tib Ant 3 3   PF S1/2 Tibial Grc/Sol 3 3    Reflexes:  Right Left Comments  Pectoralis      Biceps (C5/6) 2 2   Brachioradialis (C5/6) 2 2    Triceps (C6/7) 2 2    Patellar (L3/4) 2 2    Achilles (S1) 0 0    Hoffman      Plantar mute mute   Jaw jerk    Sensation:  Light touch Intact throughout   Pin prick    Temperature    Vibration   Proprioception    Coordination/Complex Motor:  - Finger to Nose intact BL - Heel to shin unable to do - Rapid alternating movement are normal - Gait: deferred as it is unsafe to assess given her BL lower extremity weakness.  Labs   CBC:  Recent Labs  Lab 05/01/21 1722  WBC 8.4  NEUTROABS 5.8  HGB 16.6*  HCT 46.0  MCV 88.3  PLT 150    Basic Metabolic Panel:  Lab Results  Component Value Date   NA 139 05/01/2021   K 4.0 05/01/2021   CO2 28 05/01/2021   GLUCOSE 206 (H) 05/01/2021   BUN 15 05/01/2021   CREATININE 0.63 05/01/2021   CALCIUM 8.8 (L)  05/01/2021   GFRNONAA >60 05/01/2021   Lipid Panel:  Lab Results  Component Value Date   LDLCALC 109 (H) 05/02/2021   HgbA1c:  Lab Results  Component Value Date   HGBA1C 7.3 (H) 05/02/2021   Urine Drug Screen: No results found for: LABOPIA, COCAINSCRNUR, LABBENZ, AMPHETMU, THCU, LABBARB  Alcohol Level No results found for: [facility]  MRI thoracic and lumbar spine (Personally reviewed): No cord compression, no Ts/STIR cord lesion.  MR Venogram head(Personally reviewed): No dural sinus venous thrombosis.  MRI Brain(Personally reviewed): No acute intracranial abnormality, specifically no bilateral ACA infarct.  Impression   SHAKEMIA MADERA is a 65 y.o. female with PMH significant for cutaneous AL amyloidosis, diabetes, hypertension who presents with 3 episodes of a weird brain sounds that felt like a brain freeze and wobbliness  on her feet followed by bilateral lower extremity weakness and now has significant BL lower extremity weakness and unable to walk. She denies any further episodes of headaches. Neuro exam with intact reflexes in BL kness, but absent in BL ankles and mute planters BL. She is also around 3/5 in BL lower extremities and I am not sure if this is entirely effort dependent.  Work-up so far with MRI brain, T and L-spine are nonrevealing.  CSF studies with mildly elevated protein to 54 but I am not surprised by elevated protein since she has history of amyloidosis.  It is difficult to exclude GBS as a cause to her presentation.  It is not uncommon for patients with GBS like presentation to have no albuminocytologic dissociation on LP specially when it is done within the first week of presentation.  It is also not uncommon for patients with GBS to have intact reflexes atleast initially. I do think  that her weakness appears very symmetric and out of proportion to what I would expect if it was just myalgias/arthralgias post vaccination. I would not expect infectious  causes/micronutrient deficiency to progress so fast and again LP is not consistent with an infectious process. I think it is reasonable to consider treating her with IVIG. Would need to run it by Hematology in the AM, to see if this is okay given her history of Amyloidosis.  Impression: -Bilateral lower extremity weakness. 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 -History of amyloidosis.  Recommendations  - IVIG 0.4g/Kg every 24 hours x 5 doses if cleared by hematology given her history of amyloidosis. - Every 8 hours NIFs and VC, can space out if stable - Serum B12 with MMA, folate, TSH, Vit B6, SPEP. - Cardiac monitoring. - Pt and OT. - Neurology will continue to follow along. ______________________________________________________________________  Plan discussed with Dr. Carollee Herter with the overnight Hospitalist team.  Thank you for the opportunity to take part in the care of this patient. If you have any further questions, please contact the neurology consultation attending.  Signed,  Erick Blinks [facility] Pager Number [phone] _ _ _   _ __   _ __ _ _  __ __   _ __   __ _

## 2021-05-04 NOTE — Progress Notes (Signed)
Patient performed NIF -20, VC 1.5L, good effort.

## 2021-05-05 ENCOUNTER — Inpatient Hospital Stay (HOSPITAL_COMMUNITY): Payer: Medicare HMO

## 2021-05-05 DIAGNOSIS — F32A Depression, unspecified: Secondary | ICD-10-CM | POA: Diagnosis present

## 2021-05-05 DIAGNOSIS — G43109 Migraine with aura, not intractable, without status migrainosus: Secondary | ICD-10-CM | POA: Diagnosis present

## 2021-05-05 DIAGNOSIS — R29898 Other symptoms and signs involving the musculoskeletal system: Secondary | ICD-10-CM | POA: Diagnosis not present

## 2021-05-05 DIAGNOSIS — R270 Ataxia, unspecified: Secondary | ICD-10-CM | POA: Diagnosis present

## 2021-05-05 DIAGNOSIS — Z8249 Family history of ischemic heart disease and other diseases of the circulatory system: Secondary | ICD-10-CM | POA: Diagnosis not present

## 2021-05-05 DIAGNOSIS — I10 Essential (primary) hypertension: Secondary | ICD-10-CM | POA: Diagnosis present

## 2021-05-05 DIAGNOSIS — I959 Hypotension, unspecified: Secondary | ICD-10-CM | POA: Diagnosis not present

## 2021-05-05 DIAGNOSIS — Z794 Long term (current) use of insulin: Secondary | ICD-10-CM | POA: Diagnosis not present

## 2021-05-05 DIAGNOSIS — Z91041 Radiographic dye allergy status: Secondary | ICD-10-CM | POA: Diagnosis not present

## 2021-05-05 DIAGNOSIS — Z6836 Body mass index (BMI) 36.0-36.9, adult: Secondary | ICD-10-CM | POA: Diagnosis not present

## 2021-05-05 DIAGNOSIS — E669 Obesity, unspecified: Secondary | ICD-10-CM | POA: Diagnosis present

## 2021-05-05 DIAGNOSIS — R0902 Hypoxemia: Secondary | ICD-10-CM

## 2021-05-05 DIAGNOSIS — Z20822 Contact with and (suspected) exposure to covid-19: Secondary | ICD-10-CM | POA: Diagnosis present

## 2021-05-05 DIAGNOSIS — F600 Paranoid personality disorder: Secondary | ICD-10-CM | POA: Diagnosis not present

## 2021-05-05 DIAGNOSIS — R440 Auditory hallucinations: Secondary | ICD-10-CM | POA: Diagnosis present

## 2021-05-05 DIAGNOSIS — D696 Thrombocytopenia, unspecified: Secondary | ICD-10-CM | POA: Diagnosis not present

## 2021-05-05 DIAGNOSIS — F419 Anxiety disorder, unspecified: Secondary | ICD-10-CM | POA: Diagnosis present

## 2021-05-05 DIAGNOSIS — I639 Cerebral infarction, unspecified: Secondary | ICD-10-CM | POA: Diagnosis not present

## 2021-05-05 DIAGNOSIS — E1165 Type 2 diabetes mellitus with hyperglycemia: Secondary | ICD-10-CM | POA: Diagnosis not present

## 2021-05-05 DIAGNOSIS — R531 Weakness: Secondary | ICD-10-CM | POA: Diagnosis present

## 2021-05-05 DIAGNOSIS — K625 Hemorrhage of anus and rectum: Secondary | ICD-10-CM | POA: Diagnosis not present

## 2021-05-05 DIAGNOSIS — E1149 Type 2 diabetes mellitus with other diabetic neurological complication: Secondary | ICD-10-CM | POA: Diagnosis present

## 2021-05-05 DIAGNOSIS — N390 Urinary tract infection, site not specified: Secondary | ICD-10-CM | POA: Diagnosis not present

## 2021-05-05 DIAGNOSIS — E8581 Light chain (AL) amyloidosis: Secondary | ICD-10-CM | POA: Diagnosis present

## 2021-05-05 DIAGNOSIS — Z833 Family history of diabetes mellitus: Secondary | ICD-10-CM | POA: Diagnosis not present

## 2021-05-05 DIAGNOSIS — E039 Hypothyroidism, unspecified: Secondary | ICD-10-CM | POA: Diagnosis present

## 2021-05-05 DIAGNOSIS — J9811 Atelectasis: Secondary | ICD-10-CM | POA: Diagnosis present

## 2021-05-05 LAB — GLUCOSE, CAPILLARY
Glucose-Capillary: 153 mg/dL — ABNORMAL HIGH (ref 70–99)
Glucose-Capillary: 283 mg/dL — ABNORMAL HIGH (ref 70–99)
Glucose-Capillary: 174 mg/dL — ABNORMAL HIGH (ref 70–99)
Glucose-Capillary: 170 mg/dL — ABNORMAL HIGH (ref 70–99)

## 2021-05-05 LAB — CBC
HCT: 43.3 % (ref 36.0–46.0)
Hemoglobin: 15.1 g/dL — ABNORMAL HIGH (ref 12.0–15.0)
MCH: 30.6 pg (ref 26.0–34.0)
MCHC: 34.9 g/dL (ref 30.0–36.0)
MCV: 87.8 fL (ref 80.0–100.0)
Platelets: 117 10*3/uL — ABNORMAL LOW (ref 150–400)
RBC: 4.93 MIL/uL (ref 3.87–5.11)
RDW: 13.1 % (ref 11.5–15.5)
WBC: 19 10*3/uL — ABNORMAL HIGH (ref 4.0–10.5)
nRBC: 0 % (ref 0.0–0.2)

## 2021-05-05 LAB — COMPREHENSIVE METABOLIC PANEL
ALT: 27 U/L (ref 0–44)
AST: 30 U/L (ref 15–41)
Albumin: 2.8 g/dL — ABNORMAL LOW (ref 3.5–5.0)
Alkaline Phosphatase: 76 U/L (ref 38–126)
Anion gap: 7 (ref 5–15)
BUN: 23 mg/dL (ref 8–23)
CO2: 25 mmol/L (ref 22–32)
Calcium: 8.4 mg/dL — ABNORMAL LOW (ref 8.9–10.3)
Chloride: 105 mmol/L (ref 98–111)
Creatinine, Ser: 0.79 mg/dL (ref 0.44–1.00)
GFR, Estimated: >60 mL/min (ref >60)
Glucose, Bld: 149 mg/dL — ABNORMAL HIGH (ref 70–99)
Potassium: 4.1 mmol/L (ref 3.5–5.1)
Sodium: 137 mmol/L (ref 135–145)
Total Bilirubin: 0.7 mg/dL (ref 0.3–1.2)
Total Protein: 6.3 g/dL — ABNORMAL LOW (ref 6.5–8.1)

## 2021-05-05 LAB — D-DIMER, QUANTITATIVE: D-Dimer, Quant: 0.85 ug/mL-FEU — ABNORMAL HIGH (ref 0.00–0.50)

## 2021-05-05 LAB — MAGNESIUM: Magnesium: 2.4 mg/dL (ref 1.7–2.4)

## 2021-05-05 LAB — KAPPA/LAMBDA LIGHT CHAINS
Kappa free light chain: 27.3 mg/L — ABNORMAL HIGH (ref 3.3–19.4)
Kappa, lambda light chain ratio: 1.14 (ref 0.26–1.65)
Lambda free light chains: 23.9 mg/L (ref 5.7–26.3)

## 2021-05-05 LAB — CSF IGG: IgG, CSF: 4 mg/dL (ref 0.0–6.7)

## 2021-05-05 IMAGING — MR MR CERVICAL SPINE W/O CM
4 of 5 series · 18 of 48 positions shown · non-contrast
Comparison: None.

CLINICAL DATA: Initial evaluation for myelopathy, acute or
progressive.

EXAM:
MRI CERVICAL SPINE WITHOUT CONTRAST
TECHNIQUE: Multiplanar, multisequence MR imaging of the cervical spine was
performed. No intravenous contrast was administered.

[Series 2: T2 · sagittal · 3.0mm · 0.43mm/px · 5 of 18 slices shown (1 of 2)]
[im 1/18]
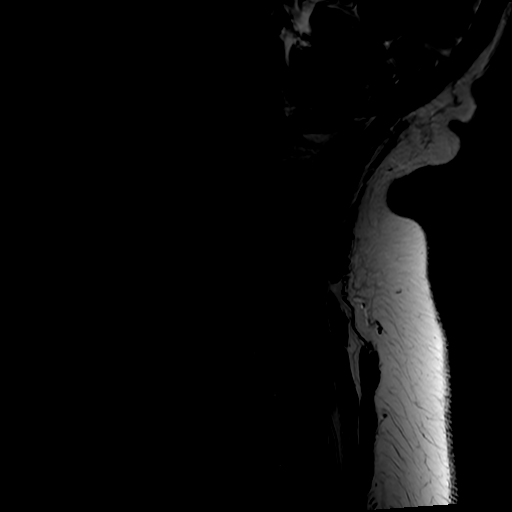
[im 5/18]
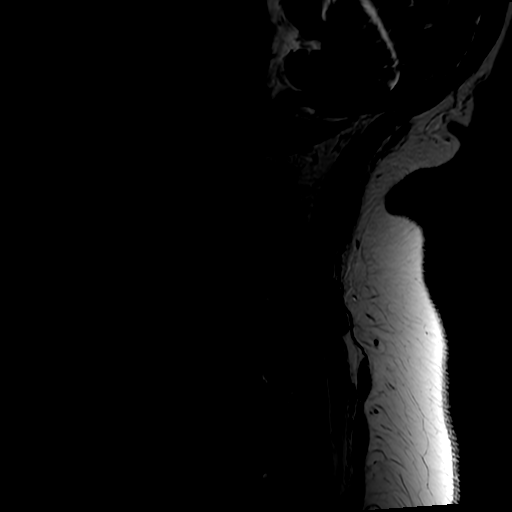
[im 9/18]
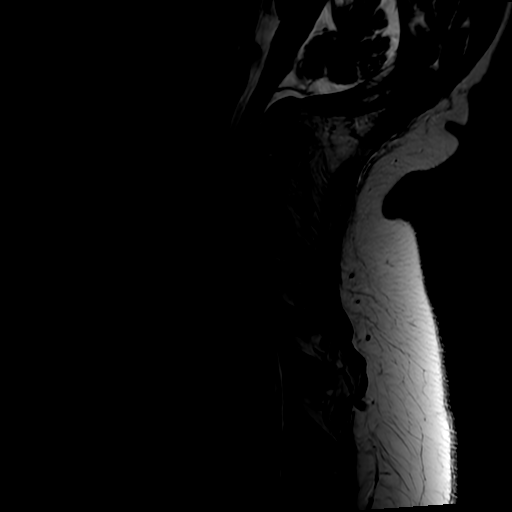
[im 13/18]
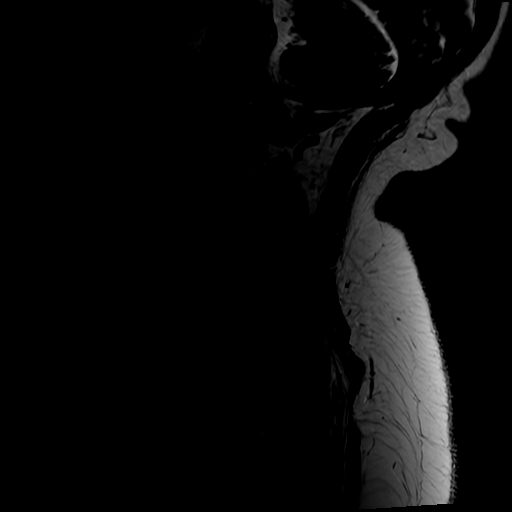
[im 18/18]
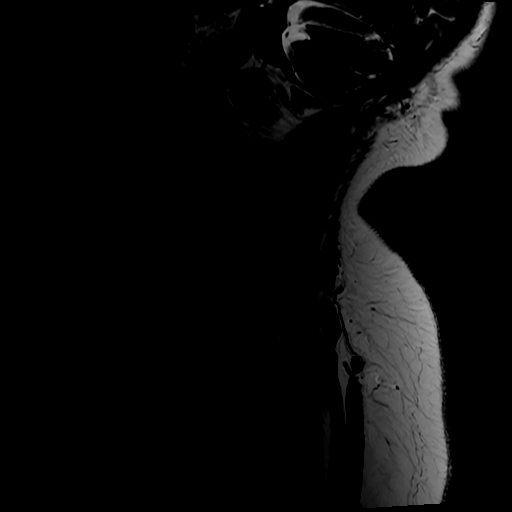

[Series 3: FLAIR · sagittal · 3.0mm · 0.43mm/px · 3 of 18 slices shown]
[im 1/18]
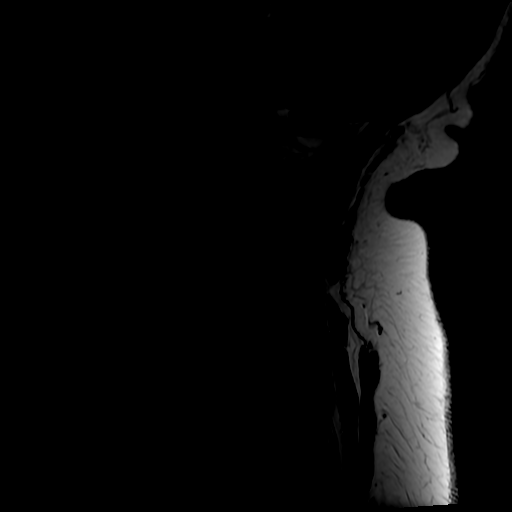
[im 9/18]
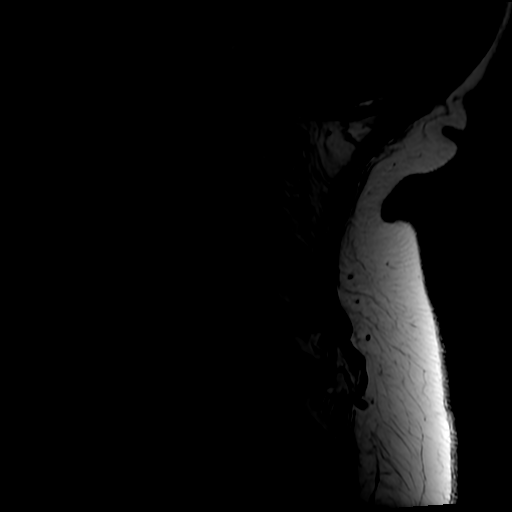
[im 18/18]
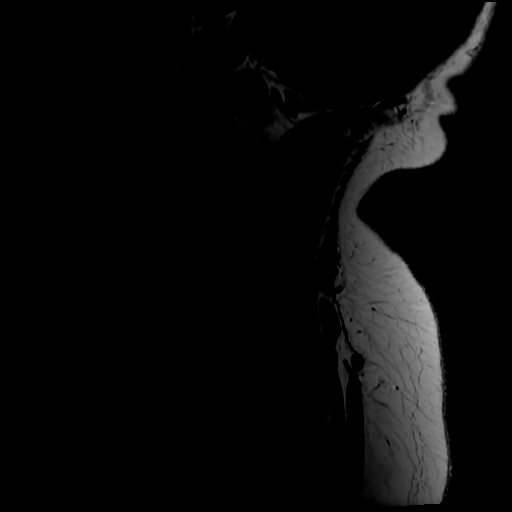

[Series 4: STIR · sagittal · 3.0mm · 0.43mm/px · 3 of 18 slices shown]
[im 1/18]
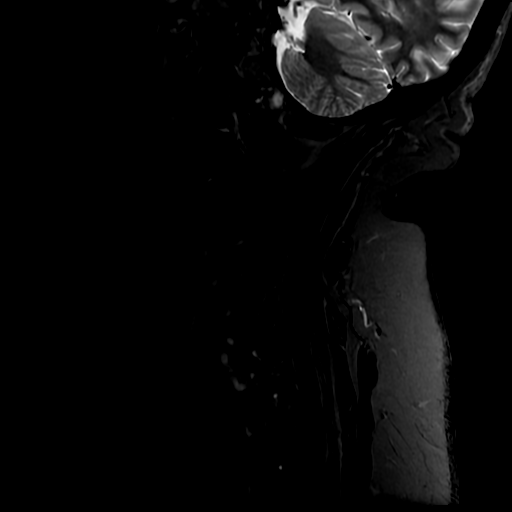
[im 9/18]
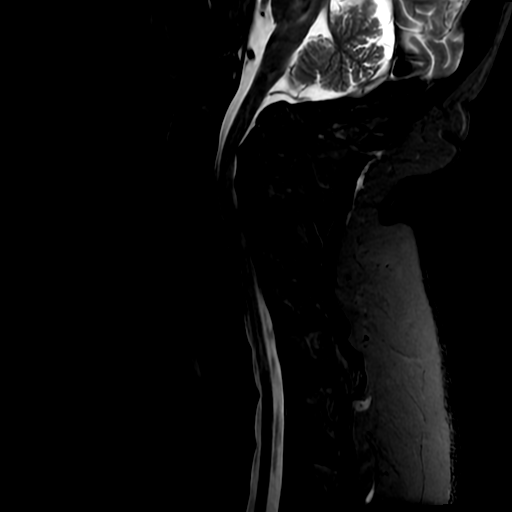
[im 18/18]
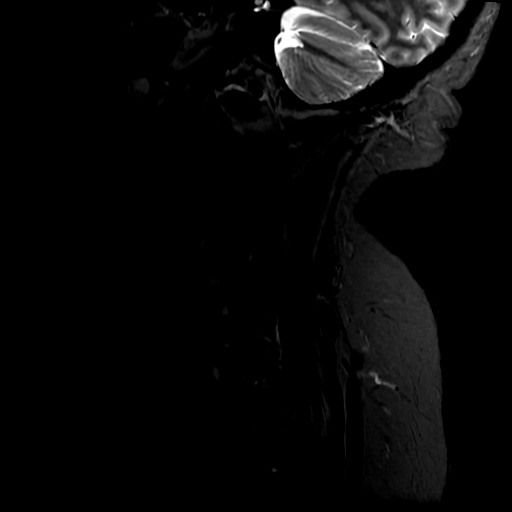

[Series 7: T2 · axial · 3.0mm · 0.35mm/px · z∈[-127,-49]mm · 7 of 29 slices shown (2 of 2)]
[im 1/29]
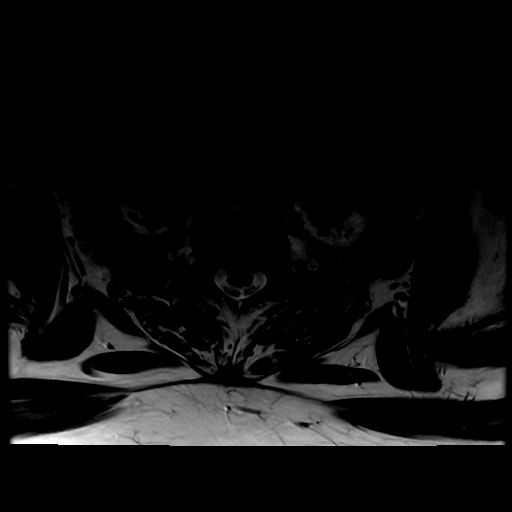
[im 5/29]
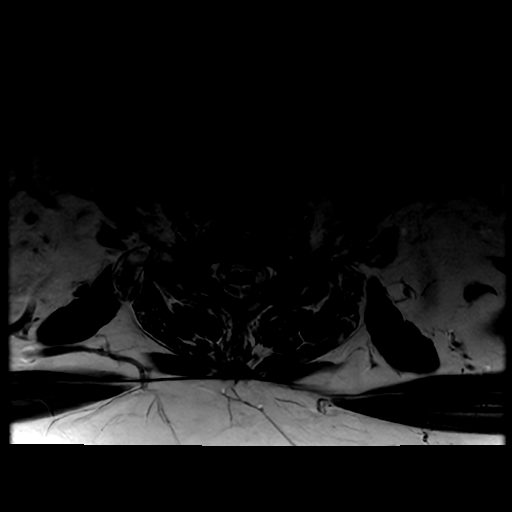
[im 9/29]
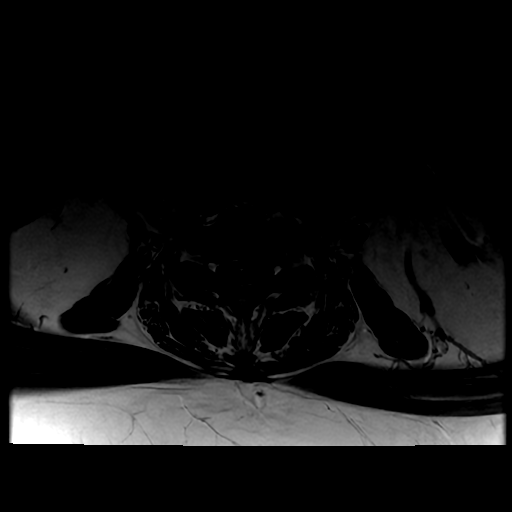
[im 13/29]
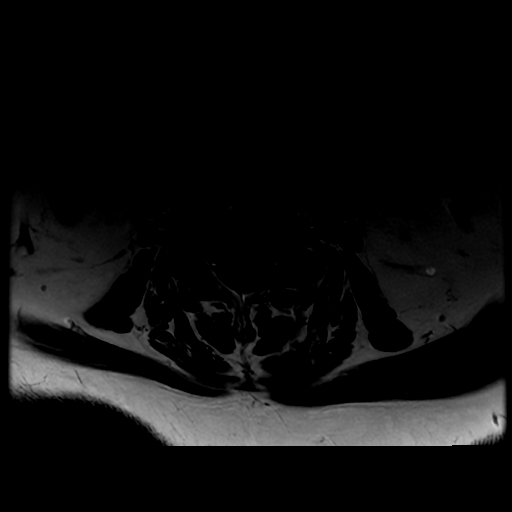
[im 17/29]
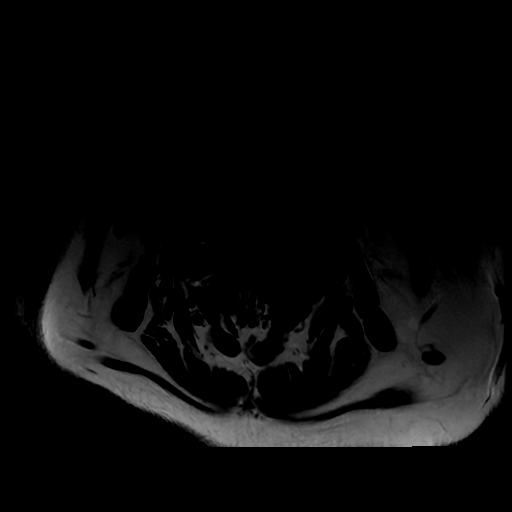
[im 21/29]
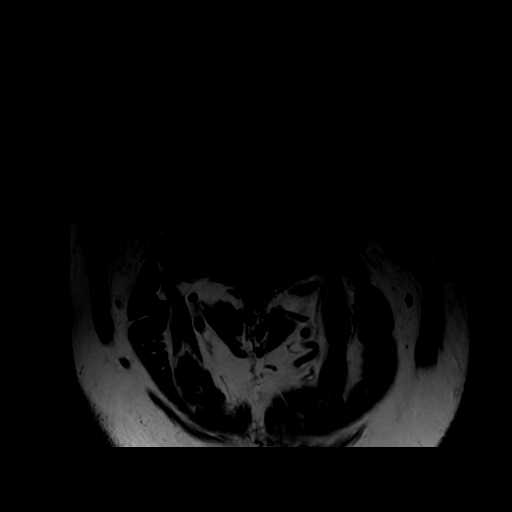
[im 25/29]
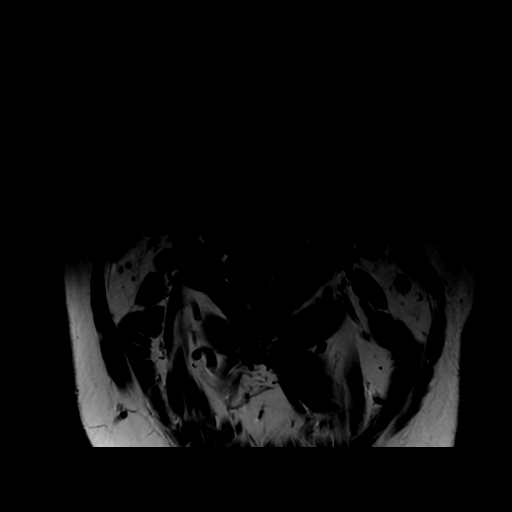

[18 of 48 positions shown; findings below may reference images not displayed]

FINDINGS: Alignment: Examination degraded by motion artifact.

Straightening with reversal of the normal cervical lordosis. No
listhesis.

Vertebrae: Vertebral body height maintained without acute or chronic
fracture. Bone marrow signal intensity heterogeneous without
worrisome osseous lesion. No abnormal marrow edema.

Cord: Signal intensity within the cervical spinal cord grossly
within normal limits. No convincing cord signal changes on this
motion degraded exam.

Posterior Fossa, vertebral arteries, paraspinal tissues: Probable
chronic microvascular ischemic disease noted within the partially
visualized pons and brainstem. Visualized brain and posterior fossa
otherwise unremarkable. Craniocervical junction normal. Paraspinous
soft tissues within normal limits. Normal flow voids seen within the
vertebral arteries bilaterally. 5 mm left thyroid nodule noted, of
doubtful significance given size and patient age, no follow-up
imaging recommended (ref: [HOSPITAL]. [DATE]): 143-50).

Disc levels:

C2-C3: Negative interspace. Left-sided facet hypertrophy. No
stenosis.

C3-C4: Negative interspace. Left greater than right facet and
ligament flavum hypertrophy. Mild ligament flavum thickening.
Resultant mild spinal stenosis. Mild left C4 foraminal narrowing.
Right neural foramen remains patent.

C4-C5: Mild disc bulge with endplate spurring. Left-sided facet
arthrosis. Mild spinal stenosis. Mild bilateral C5 foraminal
narrowing.

C5-C6: Advanced degenerative intervertebral disc space narrowing
with circumferential disc osteophyte. Flattening and partial
effacement of the ventral thecal sac with resultant moderate spinal
stenosis. Superimposed left facet and ligament flavum hypertrophy.
Moderate bilateral C6 foraminal narrowing.

C6-C7: Degenerative intervertebral disc space narrowing with diffuse
disc osteophyte complex, slightly asymmetric to the left. No
significant spinal stenosis. Mild left C7 foraminal narrowing.

C7-T1: Mild disc bulge with uncovertebral spurring. Superimposed
left foraminal disc protrusion (series 2, image 14). Mild facet
hypertrophy. No spinal stenosis. Mild to moderate left C8 foraminal
narrowing. Right foramen remains patent.
IMPRESSION: 1. Motion degraded exam.
2. Normal MRI of the cervical spinal cord. No cord signal changes to
suggest myelopathy.
3. Multilevel cervical spondylosis with resultant mild to moderate
spinal stenosis at C3-4 through C5-6, most pronounced at C5-6.
Associated mild left C4 and bilateral C5 foraminal narrowing,
moderate bilateral C6 foraminal stenosis, with mild left C7 and C8
foraminal narrowing.

## 2021-05-05 MED ORDER — HALOPERIDOL 0.5 MG PO TABS
0.5000 mg | ORAL_TABLET | Freq: Once | ORAL | Status: AC
Start: 2021-05-05 — End: 2021-05-05
  Administered 2021-05-05: 0.5 mg via ORAL
  Filled 2021-05-05: qty 1

## 2021-05-05 NOTE — Evaluation (Signed)
Occupational Therapy Evaluation Patient Details Name: Sheryl Henry MRN: [id number] DOB: [birth year] Today's Date: 05/05/2021   History of Present Illness Sheryl Henry  is a 65 y.o. female, with history of AL cutaneous amyloidosis, diabetes mellitus type 2 and hypertension. 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   Clinical Impression   Sheryl Henry was mod I PTA, she states that some days she has difficulty caring for herself or managing the 4 STE her 1 level home; however she is generally indep, does not use an AD, does not drive, and has her groceries delivered. She lives alone with very limited assistance available. Pt now requires max A for sit<>Stand transfers with extreme fear of falling due to BLE weakness. She also requires up to max A +2 for lower body ADLs. Pt will benefit from continued OT acutely. Recommend d/c to SNF - pt verbalized major concern of going back home and lose her ability to walk randomly again, she believes she may need a long term placement.     Recommendations for follow up therapy are one component of a multi-disciplinary discharge planning process, led by the attending physician.  Recommendations may be updated based on patient status, additional functional criteria and insurance authorization.   Follow Up Recommendations  Skilled nursing-short term rehab (<3 hours/day)    Assistance Recommended at Discharge Frequent or constant Supervision/Assistance  Functional Status Assessment  Patient has had a recent decline in their functional status and demonstrates the ability to make significant improvements in function in a reasonable and predictable amount of time.  Equipment Recommendations  BSC/3in1 (RW)    Recommendations for Other Services       Precautions / Restrictions Precautions Precautions: Fall Restrictions Weight Bearing Restrictions: No      Mobility Bed Mobility Overal bed mobility: Needs  Assistance Bed Mobility: Rolling;Sidelying to Sit;Sit to Supine Rolling: Min guard Sidelying to sit: Mod assist   Sit to supine: Mod assist   General bed mobility comments: cues for hand placement, assist for trunk elevation and BLE management both in and out of bed    Transfers Overall transfer level: Needs assistance Equipment used: Rolling walker (2 wheels) Transfers: Sit to/from Stand Sit to Stand: Max assist           General transfer comment: attempted sit<>stand, pt did not fully extend hips and knees. pt anxious with fear of falling      Balance Overall balance assessment: Needs assistance Sitting-balance support: Feet supported;No upper extremity supported Sitting balance-Leahy Scale: Fair     Standing balance support: Bilateral upper extremity supported;Reliant on assistive device for balance Standing balance-Leahy Scale: Poor                             ADL either performed or assessed with clinical judgement   ADL Overall ADL's : Needs assistance/impaired Eating/Feeding: Independent;Sitting   Grooming: Set up;Sitting   Upper Body Bathing: Min guard;Sitting Upper Body Bathing Details (indicate cue type and reason): for sitting balance Lower Body Bathing: Maximal assistance;+2 for physical assistance;Sit to/from stand Lower Body Bathing Details (indicate cue type and reason): +2 for standing, BLE, and peri area Upper Body Dressing : Set up;Sitting   Lower Body Dressing: Maximal assistance;+2 for physical assistance;Sit to/from stand   Toilet Transfer: Maximal assistance;Stand-pivot   Toileting- Clothing Manipulation and Hygiene: Maximal assistance;+2 for physical assistance;Sit to/from stand Toileting - Clothing Manipulation Details (indicate cue type and reason): +2 for  standing and rear peri area     Functional mobility during ADLs: Maximal assistance;Rolling walker (2 wheels) General ADL Comments: pt requires assist for BLE weakness and  poor activity tolerance, pt extremely fearful and axious this session     Vision Baseline Vision/History: 1 Wears glasses Ability to See in Adequate Light: 0 Adequate Vision Assessment?: No apparent visual deficits Additional Comments: pt states she sometimes get "floaters" when her BP or blood sugar is too high     Perception     Praxis      Pertinent Vitals/Pain Pain Assessment: 0-10 Pain Score: 5  Pain Location: back Pain Descriptors / Indicators: Constant;Discomfort;Grimacing;Guarding Pain Intervention(s): Monitored during session     Hand Dominance Right   Extremity/Trunk Assessment Upper Extremity Assessment Upper Extremity Assessment: Generalized weakness (globally 4/5, equal on both sides, sensation and coordination is [facility])   Lower Extremity Assessment Lower Extremity Assessment: Defer to PT evaluation   Cervical / Trunk Assessment Cervical / Trunk Assessment: Normal   Communication Communication Communication: No difficulties   Cognition Arousal/Alertness: Awake/alert Behavior During Therapy: WFL for tasks assessed/performed Overall Cognitive Status: Within Functional Limits for tasks assessed                                       General Comments  Pt on 3L [place] at 95% SpO2, pt 90-92% on RA wiith minimal activity - placed back on 1L [place] at the end of session with SpO2 >92%    Exercises     Shoulder Instructions      Home Living Family/patient expects to be discharged to:: Private residence Living Arrangements: Alone Available Help at Discharge: Family;Available PRN/intermittently Type of Home: Mobile home Home Access: Stairs to enter Entrance Stairs-Number of Steps: 4 (in the front) Entrance Stairs-Rails: Left (back steps has both) Home Layout: One level     Bathroom Shower/Tub: Producer, television/film/video: Handicapped height Bathroom Accessibility: Yes   Home Equipment: Hand held shower head;Grab bars - tub/shower;Shower  seat;Rollator (4 wheels)          Prior Functioning/Environment Prior Level of Function : Needs assist;Independent/Modified Independent       Physical Assist : ADLs (physical)   ADLs (physical): Bathing Mobility Comments: no AD ADLs Comments: sometimes does not feel safe to complete all of her ADLs by herself, uses wipes on these days; has not drove in 1.5 years        OT Problem List: Decreased strength;Decreased activity tolerance;Impaired balance (sitting and/or standing)      OT Treatment/Interventions: Self-care/ADL training;Therapeutic exercise;Energy conservation;DME and/or AE instruction;Therapeutic activities;Patient/family education;Balance training    OT Goals(Current goals can be found in the care plan section) Acute Rehab OT Goals Patient Stated Goal: rehab to get more indep OT Goal Formulation: With patient Time For Goal Achievement: 05/19/21 Potential to Achieve Goals: Fair  OT Frequency: Min 2X/week   Barriers to D/C: Decreased caregiver support  pt lives alone       Co-evaluation              AM-PAC OT "6 Clicks" Daily Activity     Outcome Measure Help from another person eating meals?: None Help from another person taking care of personal grooming?: A Little Help from another person toileting, which includes using toliet, bedpan, or urinal?: A Lot Help from another person bathing (including washing, rinsing, drying)?: A Lot Help from another person to  put on and taking off regular upper body clothing?: A Little Help from another person to put on and taking off regular lower body clothing?: A Lot 6 Click Score: 16   End of Session Equipment Utilized During Treatment: Oxygen;Rolling walker (2 wheels) Nurse Communication: Mobility status  Activity Tolerance: Patient tolerated treatment well Patient left: in bed;with call bell/phone within reach;with bed alarm set  OT Visit Diagnosis: Unsteadiness on feet (R26.81);Other abnormalities of gait  and mobility (R26.89);Muscle weakness (generalized) (M62.81);Ataxia, unspecified (R27.0)                Time: 7588-3254 OT Time Calculation (min): 35 min Charges:  OT General Charges $OT Visit: 1 Visit OT Evaluation $OT Eval Moderate Complexity: 1 Mod OT Treatments $Self Care/Home Management : 8-22 mins   Maelani Yarbro A Montey Ebel 05/05/2021, 8:56 AM

## 2021-05-05 NOTE — Progress Notes (Signed)
Patient returned from MRI with auditory hallucinations. She said she has been hearing voices now for about 8 months off and on.  The voices are "messing with her". They will say something simple and then say they are waiting to see if she tells anyone. She will hear one or two words, sometimes from her son (who is not present). Primary provider notified.

## 2021-05-05 NOTE — Progress Notes (Signed)
Inpatient Diabetes Program Recommendations  AACE/ADA: New Consensus Statement on Inpatient Glycemic Control (2015)  Target Ranges:  Prepandial:   less than 140 mg/dL      Peak postprandial:   less than 180 mg/dL (1-2 hours)      Critically ill patients:  140 - 180 mg/dL   Lab Results  Component Value Date   GLUCAP 283 (H) 05/05/2021   HGBA1C 7.3 (H) 05/02/2021    Review of Glycemic Control Results for Sheryl Henry, Sheryl Henry (MRN [id number]) as of 05/05/2021 14:07  Ref. Range 05/04/2021 11:16 05/04/2021 16:43 05/04/2021 21:12 05/05/2021 06:41 05/05/2021 11:31  Glucose-Capillary Latest Ref Range: 70 - 99 mg/dL 242 (H) 683 (H) 419 (H) 153 (H) 283 (H)   Diabetes history: DM 2 Outpatient Diabetes medications:  Basaglar 20 units daily Current orders for Inpatient glycemic control:  Novolog moderate tid with meals and HS Semglee 10 units daily Novolog 5 units tid with meals  Inpatient Diabetes Program Recommendations:   May consider increasing Semglee to 15 units daily.    Thanks,  Beryl Meager, RN, BC-ADM Inpatient Diabetes Coordinator Pager [phone]  (8a-5p)

## 2021-05-05 NOTE — Progress Notes (Signed)
Patient gave good effort with NIF and VC maneuvers  NIF-30 VC 1.6.

## 2021-05-05 NOTE — Progress Notes (Signed)
[facility] PROGRESS NOTE   Sheryl Henry INO:[id number] DOB: 16-Oct-1955 DOA: 05/01/2021  PCP: [name], NP  Brief History/Interval Summary: 65 y.o. female, with history of diabetes mellitus type 2 and hypertension presented to the ED with a chief complaint of ringing in her head.  Patient reports that the day prior to presentation she was totally at her normal state of health.  The day prior to that she heard a high-volume sound in her head and felt like it made her whole head vibrate.  After that she had barely any sleep all night.  Symptoms seem to resolve and then the day of presentation it happened again.  Around lunchtime she had an intense feeling in her head.  She does not characterize it as pain, but cannot describe the sensation.  She laid down to take a nap at 2:00.  When she woke up she had the high-volume sound in her head again.  She reports that it happened so fast she cannot describe the characteristics of it.  He goes "through her head" but she cannot describe where it starts and finishes.  She says that the feeling that she has after that is like her brain needs a repeat.  It is an intense feeling and she does not know how to make it go away.  She knows for sure the soundwave brings it on.  The feeling that her brain needs to remove it is in both of her temples.  She again says that its not the pain.  Per patient, this soundwave is her main concern.  Son had told the ER that she has had this before and she has been off some of her psych medications.  They were planning to discharge her home with follow-up to psychiatry, patient then announced that she has not been able to walk all day.  She reports she feels like she is staggering.  She does not feel dizzy or like the room is spinning.   Her LE weakness worsened after receiving the flu and covid vaccines recently.    Reason for Visit: Lower extremity weakness, bilateral  Consultants: Neurology  Procedures: Lumbar  puncture    Subjective/Interval History: Patient continues to have weakness in both her lower extremities.  Denies any chest pain or shortness of breath.  No nausea vomiting.       Assessment/Plan:  Bilateral lower extremity weakness Concern for Guillain-Barr syndrome due to symptom onset after she was given COVID and influenza vaccine recently. Neurology was consulted and is following.  Patient underwent MRI of the brain and thoracic and lumbar spine which did not show any acute findings that could account for her weakness.  Underwent MR venogram which did not show any sinus thrombosis. Patient underwent lumbar puncture which showed 2 WBC, protein was 54 which is elevated.  Glucose was also 120 which was elevated. Respiratory status is stable.  Continue with vital capacity and NIF measurements. Neurology continues to follow.  No clear indication for IVIG as yet since diagnosis is not certain. MRI cervical spine to be pursued today.  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  Continue SSI.  Monitor CBGs.  She is on glargine 10 units daily.  AL amyloidosis, cutaneous Followed by Dr. Marcell Anger at [facility].  Appears to be primarily cutaneous amyloidosis based on his notes from December 2021.  Currently not on any systemic treatment.  Previously has been on thalidomide, Revlimid, melphalan.  Not on treatment since  2016. As mentioned above this was discussed with Dr. Irene Limbo.  He recommends ordering multiple myeloma panel along with light chains.  Results are pending.  Oxygen requirement/hypoxia Chest x-ray showed atelectasis.  Patient denies any chest pain shortness of breath.  No cough.  D-dimer noted to be mildly abnormal.  No lower extremity swelling noted.  Very low suspicion for venous thromboembolism.  Incentive spirometry.  Try to wean her off of oxygen.  Leukocytosis/mild thrombocytopenia Significant rise in WBC noted.  She is afebrile.   No obvious signs of infection.  Recently done UA does not suggest infection.  Could be reactive.  We will recheck tomorrow.  Hypothyroidism Continue levothyroxine.  History of migraine headaches. Stable currently.  Was given migraine cocktail on 11/11.  Obesity Estimated body mass index is 36.91 kg/m as calculated from the following:   Height as of this encounter: 5' 7"  (1.702 m).   Weight as of this encounter: 106.9 kg.    DVT Prophylaxis: Subcutaneous heparin Code Status: Full code Family Communication: Discussed with the patient.  No family at bedside Disposition Plan:  skilled nursing facility recommended by OT  Status is: Inpatient  Remains inpatient appropriate because: Inability to ambulate, unsafe discharge       Medications: Scheduled:  dicyclomine  20 mg Oral TID AC & HS   heparin  5,000 Units Subcutaneous Q8H   insulin aspart  0-15 Units Subcutaneous TID WC   insulin aspart  0-5 Units Subcutaneous QHS   insulin aspart  5 Units Subcutaneous TID WC   insulin glargine-yfgn  10 Units Subcutaneous Daily   levothyroxine  50 mcg Oral Q0600   metoprolol tartrate  12.5 mg Oral BID   risperiDONE  1 mg Oral BID   venlafaxine XR  75 mg Oral Daily   Continuous: TIW:[id number] **OR** acetaminophen (TYLENOL) oral liquid 160 mg/5 mL **OR** acetaminophen, oxyCODONE-acetaminophen, senna-docusate  Antibiotics: Anti-infectives (From admission, onward)    None       Objective:  Vital Signs  Vitals:   05/04/21 2000 05/05/21 0004 05/05/21 0359 05/05/21 0800  BP: (!) 135/59 124/70 (!) 145/78 118/87  Pulse: 88 76 87 86  Resp: 19 18 18 20   Temp: 98.3 F (36.8 C) 98.5 F (36.9 C) 98.6 F (37 C) 98 F (36.7 C)  TempSrc: Oral Oral Oral Oral  SpO2: 96% 97% 99% 95%  Weight:      Height:       No intake or output data in the 24 hours ending 05/05/21 1128 Filed Weights   05/01/21 1620 05/02/21 0222  Weight: 81.6 kg 106.9 kg    General appearance: Awake  alert.  In no distress Resp: Clear to auscultation bilaterally.  Normal effort Cardio: S1-S2 is normal regular.  No S3-S4.  No rubs murmurs or bruit GI: Abdomen is soft.  Nontender nondistended.  Bowel sounds are present normal.  No masses organomegaly Extremities: No edema.   Neurologic: Alert and oriented x3.  No facial asymmetry.  Equal motor strength bilateral upper extremities.  Unable to raise her legs   Lab Results:  Data Reviewed: I have personally reviewed following labs and imaging studies  CBC: Recent Labs  Lab 05/01/21 1722 05/05/21 0301  WBC 8.4 19.0*  NEUTROABS 5.8  --   HGB 16.6* 15.1*  HCT 46.0 43.3  MCV 88.3 87.8  PLT 150 117*     Basic Metabolic Panel: Recent Labs  Lab 05/01/21 1722 05/04/21 1214 05/05/21 0301  NA 139 137 137  K 4.0 4.4  4.1  CL 102 106 105  CO2 28 24 25   GLUCOSE 206* 208* 149*  BUN 15 22 23   CREATININE 0.63 0.94 0.79  CALCIUM 8.8* 8.3* 8.4*  MG 2.1 2.4 2.4     GFR: Estimated Creatinine Clearance: 89.4 mL/min (by C-G formula based on SCr of 0.79 mg/dL).  Liver Function Tests: Recent Labs  Lab 05/01/21 1722 05/05/21 0301  AST 34 30  ALT 28 27  ALKPHOS 84 76  BILITOT 0.8 0.7  PROT 7.9 6.3*  ALBUMIN 4.2 2.8*       CBG: Recent Labs  Lab 05/04/21 0644 05/04/21 1116 05/04/21 1643 05/04/21 2112 05/05/21 0641  GLUCAP 197* 235* 191* 250* 153*       Recent Results (from the past 240 hour(s))  Resp Panel by RT-PCR (Flu A&B, Covid) Urine, Clean Catch     Status: None   Collection Time: 05/02/21 12:06 AM   Specimen: Urine, Clean Catch; Nasopharyngeal(NP) swabs in vial transport medium  Result Value Ref Range Status   SARS Coronavirus 2 by RT PCR NEGATIVE NEGATIVE Final    Comment: (NOTE) SARS-CoV-2 target nucleic acids are NOT DETECTED.  The SARS-CoV-2 RNA is generally detectable in upper respiratory specimens during the acute phase of infection. The lowest concentration of SARS-CoV-2 viral copies this assay  can detect is 138 copies/mL. A negative result does not preclude SARS-Cov-2 infection and should not be used as the sole basis for treatment or other patient management decisions. A negative result may occur with  improper specimen collection/handling, submission of specimen other than nasopharyngeal swab, presence of viral mutation(s) within the areas targeted by this assay, and inadequate number of viral copies(<138 copies/mL). A negative result must be combined with clinical observations, patient history, and epidemiological information. The expected result is Negative.  Fact Sheet for Patients:  EntrepreneurPulse.com.au  Fact Sheet for Healthcare Providers:  IncredibleEmployment.be  This test is no t yet approved or cleared by the [place] FDA and  has been authorized for detection and/or diagnosis of SARS-CoV-2 by FDA under an Emergency Use Authorization (EUA). This EUA will remain  in effect (meaning this test can be used) for the duration of the COVID-19 declaration under Section 564(b)(1) of the Act, 21 U.S.C.section 360bbb-3(b)(1), unless the authorization is terminated  or revoked sooner.       Influenza A by PCR NEGATIVE NEGATIVE Final   Influenza B by PCR NEGATIVE NEGATIVE Final    Comment: (NOTE) The Xpert Xpress SARS-CoV-2/FLU/RSV plus assay is intended as an aid in the diagnosis of influenza from Nasopharyngeal swab specimens and should not be used as a sole basis for treatment. Nasal washings and aspirates are unacceptable for Xpert Xpress SARS-CoV-2/FLU/RSV testing.  Fact Sheet for Patients: EntrepreneurPulse.com.au  Fact Sheet for Healthcare Providers: IncredibleEmployment.be  This test is not yet approved or cleared by the [place] FDA and has been authorized for detection and/or diagnosis of SARS-CoV-2 by FDA under an Emergency Use Authorization (EUA). This EUA will  remain in effect (meaning this test can be used) for the duration of the COVID-19 declaration under Section 564(b)(1) of the Act, 21 U.S.C. section 360bbb-3(b)(1), unless the authorization is terminated or revoked.  Performed at [facility], [address], [place], [place] [address]   MRSA Next Gen by PCR, Nasal     Status: None   Collection Time: 05/02/21  9:11 PM   Specimen: Nasal Mucosa; Nasal Swab  Result Value Ref Range Status   MRSA by PCR Next Gen NOT  DETECTED NOT DETECTED Final    Comment: (NOTE) The GeneXpert MRSA Assay (FDA approved for NASAL specimens only), is one component of a comprehensive MRSA colonization surveillance program. It is not intended to diagnose MRSA infection nor to guide or monitor treatment for MRSA infections. Test performance is not FDA approved in patients less than 16 years old. Performed at [facility], [address], [place], [place] [address]        Radiology Studies: DG CHEST PORT 1 VIEW  Result Date: 05/04/2021 CLINICAL DATA:  Hypoxia EXAM: PORTABLE CHEST 1 VIEW COMPARISON:  None. FINDINGS: Mildly enlarged cardiomediastinal silhouette. Low lung volumes. Bibasilar subsegmental axis. No large effusion or visible pneumothorax. No acute osseous abnormality. IMPRESSION: Low lung volumes with bibasilar atelectasis. Electronically Signed   By: Maurine Simmering M.D.   On: 05/04/2021 19:21       LOS: 0 days   [facility] Pager on [email or web address]  05/05/2021, 11:28 AM

## 2021-05-05 NOTE — Progress Notes (Signed)
NIF= -20 VC= 1.4 Pt gave good effort.

## 2021-05-05 NOTE — Progress Notes (Signed)
Subjective: She feels slightly better, but feels significant weakness  Exam: Vitals:   05/05/21 0800 05/05/21 1249  BP: 118/87 116/66  Pulse: 86 92  Resp: 20 18  Temp: 98 F (36.7 C) 98.3 F (36.8 C)  SpO2: 95% 94%   Gen: In bed, NAD Resp: non-labored breathing, no acute distress Abd: soft, nt  Neuro: MS: Awake, alert communicative and appropriate TT:SVXBL, EOMI Motor: She gives poor effort in bilateral lower extremities, 3/5 throughout. Sensory: Intact to light touch, decreased vibration at the toes, intact at the ankle DTR: 2+ and symmetric at the biceps, brachioradialis, 2+ at the knee, absent at the ankle  Pertinent Labs: CSF protein 54 CSF glucose 120 CSF white count 2 CSF RBC 26 OC bands-pending  Impression: 64 year old female with relatively abrupt onset bilateral lower extremity discoordination/weakness.  With intact reflexes except at the ankle, I do not think that Guillain-Barr syndrome is at all likely and I would not favor empiric IVIG.  Her elevated protein is very nonspecific, and can be seen with both amyloidosis and diabetes, both of which she has.  Her onset was relatively abrupt, and I do get the sense that her exam is somewhat effort dependent, but given the relatively abrupt onset of bilateral symptoms, I think an MRI of her cervical spine to exclude spinal pathology would be prudent.  If this is negative, then I would focus on PT/OT.  Another possibility would be worsening of underlying previous deficits due to normal inflammatory reaction associated with adjuvant.  Recommendations: 1) MRI cervical spine 2) if negative, work with PT/OT.  Ritta Slot, MD [facility] [phone]  If 7pm- 7am, please page neurology on call as listed in AMION.

## 2021-05-06 DIAGNOSIS — R29898 Other symptoms and signs involving the musculoskeletal system: Secondary | ICD-10-CM | POA: Diagnosis not present

## 2021-05-06 DIAGNOSIS — I639 Cerebral infarction, unspecified: Secondary | ICD-10-CM | POA: Diagnosis not present

## 2021-05-06 DIAGNOSIS — E1165 Type 2 diabetes mellitus with hyperglycemia: Secondary | ICD-10-CM | POA: Diagnosis not present

## 2021-05-06 DIAGNOSIS — R0902 Hypoxemia: Secondary | ICD-10-CM | POA: Diagnosis not present

## 2021-05-06 DIAGNOSIS — F600 Paranoid personality disorder: Secondary | ICD-10-CM

## 2021-05-06 DIAGNOSIS — R440 Auditory hallucinations: Secondary | ICD-10-CM

## 2021-05-06 DIAGNOSIS — Z794 Long term (current) use of insulin: Secondary | ICD-10-CM | POA: Diagnosis not present

## 2021-05-06 LAB — CBC
HCT: 45.5 % (ref 36.0–46.0)
Hemoglobin: 15.6 g/dL — ABNORMAL HIGH (ref 12.0–15.0)
MCH: 30.4 pg (ref 26.0–34.0)
MCHC: 34.3 g/dL (ref 30.0–36.0)
MCV: 88.7 fL (ref 80.0–100.0)
Platelets: 168 10*3/uL (ref 150–400)
RBC: 5.13 MIL/uL — ABNORMAL HIGH (ref 3.87–5.11)
RDW: 12.8 % (ref 11.5–15.5)
WBC: 16.4 10*3/uL — ABNORMAL HIGH (ref 4.0–10.5)
nRBC: 0 % (ref 0.0–0.2)

## 2021-05-06 LAB — GLUCOSE, CAPILLARY
Glucose-Capillary: 189 mg/dL — ABNORMAL HIGH (ref 70–99)
Glucose-Capillary: 186 mg/dL — ABNORMAL HIGH (ref 70–99)
Glucose-Capillary: 181 mg/dL — ABNORMAL HIGH (ref 70–99)
Glucose-Capillary: 131 mg/dL — ABNORMAL HIGH (ref 70–99)

## 2021-05-06 MED ORDER — PHENOL 1.4 % MT LIQD
1.0000 | OROMUCOSAL | Status: DC | PRN
  Filled 2021-05-06 (×2): qty 177

## 2021-05-06 NOTE — Progress Notes (Signed)
Physical Therapy Treatment Patient Details Name: Sheryl Henry MRN: [id number] DOB: 1956-02-11 Today's Date: 05/06/2021   History of Present Illness Sheryl Henry  is a 65 y.o. female, with history of AL cutaneous amyloidosis, diabetes mellitus type 2 and hypertension. 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    PT Comments    Pt with sore throat and worsening weakness in bilat LEs this date however pt was cooperative and worked with PT. Pt had an episode of stool incontinence and didn't alert anyone, pt dependent for hygiene. Pt also with inconsistent effort for LE movement while at EOB doing LE exercises vs while in bed during rolling. Pt requiring maxAX2 for OOB mobility. Pt remains appropriate for ST-SNF upon d/c as pt was indep PTA. Acute PT to cont to follow.   Recommendations for follow up therapy are one component of a multi-disciplinary discharge planning process, led by the attending physician.  Recommendations may be updated based on patient status, additional functional criteria and insurance authorization.  Follow Up Recommendations  Skilled nursing-short term rehab (<3 hours/day)     Assistance Recommended at Discharge Frequent or constant Supervision/Assistance  Equipment Recommendations  None recommended by PT    Recommendations for Other Services       Precautions / Restrictions Precautions Precautions: Fall Restrictions Weight Bearing Restrictions: No     Mobility  Bed Mobility Overal bed mobility: Needs Assistance Bed Mobility: Rolling;Sidelying to Sit;Sit to Supine Rolling: Min assist Sidelying to sit: Mod assist       General bed mobility comments: directional verbal cues for log rolling, modA to elevate trunk at EOB, minA for LE management off EOB despite pt being able to move them indep when rolling side to side and bending them for hygiene    Transfers Overall transfer level: Needs  assistance Equipment used:  (stedy) Transfers: Sit to/from Stand Sit to Stand: Max assist;+2 physical assistance;From elevated surface           General transfer comment: used the stedy with bed elevated, maxAx2 in addition to max verbal cues for pt to pull self up with bilat UEs, pt with onset of abdominal pain when leaning forward, RN notified, stedy used to std pvt pt to chair    Ambulation/Gait                   Stairs             Wheelchair Mobility    Modified Rankin (Stroke Patients Only)       Balance Overall balance assessment: Needs assistance Sitting-balance support: Feet supported;No upper extremity supported Sitting balance-Leahy Scale: Fair Sitting balance - Comments: seated EOB   Standing balance support: Bilateral upper extremity supported;Reliant on assistive device for balance Standing balance-Leahy Scale: Poor Standing balance comment: dependent on external assist                            Cognition Arousal/Alertness: Awake/alert Behavior During Therapy: WFL for tasks assessed/performed Overall Cognitive Status: Within Functional Limits for tasks assessed                                 General Comments: pt with questionable effort, inconsistant LE movement ability sitting EOB compared to in bed, pt was cooperative and did follow commands        Exercises General Exercises - Lower Extremity Long  [place] PenceBarbaraann Boys;Both;10 reps;Seated Hip Flexion/Marching: AAROM;Both;10 reps;Seated    General Comments General comments (skin integrity, edema, etc.): pt had BM in bed and never alerted anyone, pt dependent for hygiene      Pertinent Vitals/Pain Pain Assessment: Faces Faces Pain Scale: Hurts whole lot Pain Location: abdomen when leaning forward to stand Pain Descriptors / Indicators: Discomfort;Grimacing;Guarding Pain Intervention(s): Monitored during session    Home Living                           Prior Function            PT Goals (current goals can now be found in the care plan section) Acute Rehab PT Goals PT Goal Formulation: With patient Time For Goal Achievement: 05/16/21 Potential to Achieve Goals: Good Progress towards PT goals: Progressing toward goals    Frequency    Min 3X/week      PT Plan Current plan remains appropriate    Co-evaluation              AM-PAC PT "6 Clicks" Mobility   Outcome Measure  Help needed turning from your back to your side while in a flat bed without using bedrails?: A Little Help needed moving from lying on your back to sitting on the side of a flat bed without using bedrails?: A Lot Help needed moving to and from a bed to a chair (including a wheelchair)?: Total Help needed standing up from a chair using your arms (e.g., wheelchair or bedside chair)?: Total Help needed to walk in hospital room?: Total Help needed climbing 3-5 steps with a railing? : Total 6 Click Score: 9    End of Session Equipment Utilized During Treatment: Gait belt Activity Tolerance: Patient limited by fatigue Patient left: with call bell/phone within reach;in chair;with chair alarm set Nurse Communication: Mobility status (use stedy to get pt back) PT Visit Diagnosis: Unsteadiness on feet (R26.81);Other abnormalities of gait and mobility (R26.89);Muscle weakness (generalized) (M62.81);Difficulty in walking, not elsewhere classified (R26.2);Pain Pain - part of body:  (abdomen)     Time: 7564-3329 PT Time Calculation (min) (ACUTE ONLY): 28 min  Charges:  $Therapeutic Exercise: 8-22 mins $Therapeutic Activity: 8-22 mins                     Lewis Shock, PT, DPT Acute Rehabilitation Services Pager #: [phone] Office #: [phone]    [name] 05/06/2021, 1:27 PM

## 2021-05-06 NOTE — Consult Note (Signed)
[name] Psychiatry New Psychiatric Evaluation   Service Date: May 06, 2021 LOS:  LOS: 1 day    Assessment  Sheryl Henry is a 65 y.o. female admitted medically for 05/01/2021  4:16 PM for ataxia/weakness. 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 Psychiatry was consulted for auditory hallucinations and concern for functional bilateral lower extremity weakness by Dr. Rito Ehrlich.    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 She is on venlafaxine ER 75 mg as an outpt and has been on and off of risperidone 1 mg BID for the past couple of years.  She was compliant with venlafaxine prior to admission as evidenced by pt report. On initial examination, patient is hypophonic and difficult to understand. She is alert, oriented, and attentive, performing DOWB with no issue. She is appropriately worried about her increasing weakness. 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 Per collateral from son, weakness developed suddenly in setting of pt being told she would have to go home; he has some concerns for pt either consciously or subconsciously magnifying symptoms to avoid going home to her apartment (has been paranoid to landlord for several months). Regardless, factitious and conversion disorders are a diagnosis of exclusion and all reasonable medical causes should be worked up, as diagnostic anchoring can lead to significant patient harm and often these patients have treatable medical  or neurologic illnesses overlapping with any functional symptoms.  Please see plan below for detailed recommendations.   Diagnoses:  Active Hospital problems: Active Problems:   Diabetes mellitus, type 2 (HCC)   Hypertension   Leg weakness, bilateral   Migraine headache with aura   Ataxia   Cutaneous amyloidosis (HCC)   Vasovagal syncope   Lower extremity weakness    Problems edited/added by me: No problems updated.  Plan  ## Safety and Observation Level:  - Based on my clinical evaluation, I estimate the patient to be at low risk of self harm in the current setting - At this time, we recommend a routine level of observation. This decision is based on my review of the chart including patient's history and current presentation, interview of the patient, mental status examination, and consideration of suicide risk including evaluating suicidal ideation, plan, intent, suicidal or self-harm behaviors, risk factors, and protective factors. This judgment is based on our ability to directly address suicide risk, implement suicide prevention strategies and develop a safety plan while the patient is in the clinical setting. Please contact our team if there is a concern that risk level has changed.   ## Medications:  -- c venlafaxine ER 75 mg -- c risperidone 1 mg BID (new med, no indication to increase today)  ## Medical Decision Making Capacity:  Not formally assessed  ## Further Work-up:  -- serum B12 with MMA, folate - pt on vitamin C supplement and level low yield -- either zinc level or empirically replete 8 mg/d  ## Disposition:  -- per primary team  ## Behavioral / Environmental:  -- Utilize compassion and acknowledge the patient's experiences while setting clear and  realistic expectations for care.  ##Legal Status   Thank you for this consult request. Recommendations have been communicated to the primary team.  We will continue to follow at this time.   [name] A  [name]    NEW vs followup history  Relevant Aspects of Hospital Course:  Admitted on 05/01/2021 for weakness and ataxia. Has had overall unconvincing workup, multiple markers slightly abnormal. Has 1-2 outstanding labs but thus far nothing explaining degree of weakness  Patient Report:  Patient seen in late afternoon. Talks in a whisper. She asks if her chair is rocking (she was sitting still) and states she feels like the room is moving around her. She was alert and oriented with the exception of thinking the next holiday is Christmas; able to perform DOWB with no issues.   Due to hypophonia (pt also seems to fatigue with conversation) interview mostly limited to patient answering yes/no or short answer questions. She frequently defers questions, stating she is "confused". She does give permission to call her son as collateral.  States she has been on effexor for a long time (years). Has had 2 episodes of hearing voices, once several years ago and again in past few days. Denies any hallucinations in between. Was hearing something like "[employer]" a few days ago - unable to determine exactly what she was hearing. Denied any CAH. Mood lately has been "confused, worried". Sleep has been poor. Appetite, energy, concentration have all been poor. Some psychomotor slowing/blunting of facies. Had difficulty performing mania/psychosis screen - pt with defintie history of insomnia, stated she was "confused" when asking most other mania/psychosis screening questions. Pt denied lifetime psych hospitalizations (See son's collateral below).   Physical exam pertinent + macroglossia, dry skin (most prominent on skins), petechial rash on u/e,and  nail pitting . No cogwheeling, rigidity.    ROS:  Limited 2/2 hypophonia. Did not endorse pain, did endorse dizziness/vergigo.   Collateral information:  Son says that there was a period of time at [facility] she was hearing things (no CAH) - would hear her brother  outside talking, or her aunt outside talking. At one point convinced neighbor or [employer] spying on her. She has had that issue in the past. She claims that [facility] took her off of the risperidone because the psychosis was triggered by steroids (dexamethasone), however recently she moved back and when she first moved into her new place began acting paranoid to landlord (11/2019). Lately had been demonstrating more paranoia to landlord. Had made comments to son about needing to go to a nursing home or hospice related to paranoia to landlord. Immediately after being told she would not be sent to a nursing home and getting outpt mental health followup, claimed she couldn't walk. Doesn't leave the house except for dr's appointments. At baseline she gets her own groceries (orders them online). Eats a lot of ice cream, ham sandwiches, bagels, cream cheese - no fruits and veggies. Hair loss has been going for several months at least. Has bulk quantities of vitamins at home, doesn't know what she takes - names several in EMR. Had checked herself in a couple of years ago at [facility], but no psych history prior to that. Patient's ex husband has told son she has always been paranoid and manufacturing stories that people are out to get her.  Has a brother with dx of either schizophrenia or mania.   Psychiatric History:  Information collected from pt, son  Family psych history: One brother with either bipolar d/o  or schizophrenia  Medical History: Past Medical History:  Diagnosis Date  . Cutaneous amyloidosis ([place])   . Diabetes mellitus without complication ([place])   . Hypertension     Surgical History: Past Surgical History:  Procedure Laterality Date  . BACK SURGERY    . BREAST SURGERY    . MANDIBLE SURGERY      Medications:   Current Facility-Administered Medications:  .  acetaminophen (TYLENOL) tablet 650 mg, 650 mg, Oral, Q4H PRN, 650 mg at 05/06/21 0621 **OR** acetaminophen (TYLENOL) 160 MG/5ML solution 650 mg,  650 mg, Per Tube, Q4H PRN **OR** acetaminophen (TYLENOL) suppository 650 mg, 650 mg, Rectal, Q4H PRN, Zierle-Ghosh, Asia B, DO .  dicyclomine (BENTYL) tablet 20 mg, 20 mg, Oral, TID AC & HS, Johnson, Clanford L, MD, 20 mg at 05/06/21 1240 .  heparin injection 5,000 Units, 5,000 Units, Subcutaneous, Q8H, Johnson, Clanford L, MD, 5,000 Units at 05/06/21 1339 .  insulin aspart (novoLOG) injection 0-15 Units, 0-15 Units, Subcutaneous, TID WC, Zierle-Ghosh, Asia B, DO, 3 Units at 05/06/21 1240 .  insulin aspart (novoLOG) injection 0-5 Units, 0-5 Units, Subcutaneous, QHS, Zierle-Ghosh, Asia B, DO, 2 Units at 05/04/21 2218 .  insulin aspart (novoLOG) injection 5 Units, 5 Units, Subcutaneous, TID WC, Johnson, Clanford L, MD, 5 Units at 05/06/21 1241 .  insulin glargine-yfgn (SEMGLEE) injection 10 Units, 10 Units, Subcutaneous, Daily, Wynetta Emery, Clanford L, MD, 10 Units at 05/06/21 0837 .  levothyroxine (SYNTHROID) tablet 50 mcg, 50 mcg, Oral, Q0600, Wynetta Emery, Clanford L, MD, 50 mcg at 05/06/21 0620 .  metoprolol tartrate (LOPRESSOR) tablet 12.5 mg, 12.5 mg, Oral, BID, Bonnielee Haff, MD, 12.5 mg at 05/06/21 0837 .  oxyCODONE-acetaminophen (PERCOCET/ROXICET) 5-325 MG per tablet 1 tablet, 1 tablet, Oral, QID PRN, Wynetta Emery, Clanford L, MD, 1 tablet at 05/05/21 2129 .  phenol (CHLORASEPTIC) mouth spray 1 spray, 1 spray, Mouth/Throat, PRN, Bonnielee Haff, MD .  risperiDONE (RISPERDAL M-TABS) disintegrating tablet 1 mg, 1 mg, Oral, BID, Johnson, Clanford L, MD, 1 mg at 05/06/21 0837 .  senna-docusate (Senokot-S) tablet 1 tablet, 1 tablet, Oral, QHS PRN, Zierle-Ghosh, Asia B, DO .  venlafaxine XR (EFFEXOR-XR) 24 hr capsule 75 mg, 75 mg, Oral, Daily, Johnson, Clanford L, MD, 75 mg at 05/06/21 0837  Allergies: Allergies  Allergen Reactions  . Ivp Dye [Iodinated Diagnostic Agents] Swelling    Social History:  Lives alone  Tobacco use: no Family History:  The patient's family history is not on file.     Objective  Vital signs:  Temp:  [98.3 F (36.8 C)-98.7 F (37.1 C)] 98.4 F (36.9 C) (11/15 1240) Pulse Rate:  [98-113] 113 (11/15 1240) Resp:  [18-20] 20 (11/15 1240) BP: (127-155)/(61-99) 146/85 (11/15 1250) SpO2:  [93 %-95 %] 95 % (11/15 1240)  Physical Exam: Gen: Sitting in chair, no acute distress  Neuro: 5/5 strength in RUE, 4/5 in LUE Skin/integumentary:  Physical exam pertinent + macroglossia, dry skin (most prominent on skins), petechial rash on u/e,and nail pitting. No cogwheeling, rigidity.  Psych: NO SI/HI, + AH.   Mental Status Exam:   05/06/21 1619  Presentation  General Appearance Appropriate for Environment  Eye Contact Fair  Speech  (whispering)  Speech Volume Decreased  Mood and Affect  Mood  (worried)  Affect Blunt  Thought Processes  Thought Process  (Difficult to assess 2/2 hypophonia. Seemed concrete)  Descriptions of Associations Intact  Orientation Partial  Thought Content Paranoid Ideation  Has patient ever had a diagnosis of schizophrenia or schizoaffective disorder in the  past? No  Duration of Psychotic Symptoms Less than six months  Hallucinations Auditory  Description of Auditory Hallucinations something about money, no CAH  Ideas of Reference Paranoia  Suicidal Thoughts No  Homicidal Thoughts No  Sensorium  Memory Immediate Fair;Recent Fair;Remote Fair  Judgment Poor  Insight Poor  Executive Functions  Concentration Fair  Attention Span Good  Recall [employer] of Knowledge Fair  Language Fair  Psychomotor Activity  Psychomotor Activity Decreased  Assets  Assets Social Support  Sleep  Sleep Poor

## 2021-05-06 NOTE — Progress Notes (Signed)
RT at bedside for NIF and VC. Pt stated she is not okay. Pt stated her throat was killing her. Pt agreed to do NIF and VC. NIF= -15 & VC= 1.06 with best pt could do.

## 2021-05-06 NOTE — Progress Notes (Addendum)
[facility] PROGRESS NOTE   Sheryl Henry EYE:[id number] DOB: 1955/11/06 DOA: 05/01/2021  PCP: [name], NP  Brief History/Interval Summary: 65 y.o. female, with history of diabetes mellitus type 2 and hypertension presented to the ED with a chief complaint of ringing in her head.  Patient reports that the day prior to presentation she was totally at her normal state of health.  The day prior to that she heard a high-volume sound in her head and felt like it made her whole head vibrate.  After that she had barely any sleep all night.  Symptoms seem to resolve and then the day of presentation it happened again.  Around lunchtime she had an intense feeling in her head.  She does not characterize it as pain, but cannot describe the sensation.  She laid down to take a nap at 2:00.  When she woke up she had the high-volume sound in her head again.  She reports that it happened so fast she cannot describe the characteristics of it.  He goes "through her head" but she cannot describe where it starts and finishes.  She says that the feeling that she has after that is like her brain needs a repeat.  It is an intense feeling and she does not know how to make it go away.  She knows for sure the soundwave brings it on.  The feeling that her brain needs to remove it is in both of her temples.  She again says that its not the pain.  Per patient, this soundwave is her main concern.  Son had told the ER that she has had this before and she has been off some of her psych medications.  They were planning to discharge her home with follow-up to psychiatry, patient then announced that she has not been able to walk all day.  She reports she feels like she is staggering.  She does not feel dizzy or like the room is spinning.   Her LE weakness worsened after receiving the flu and covid vaccines recently.    Reason for Visit: Lower extremity weakness, bilateral  Consultants: Neurology  Procedures: Lumbar  puncture    Subjective/Interval History: Patient does not report any improvement in her lower extremity weakness.  She complains of sore throat this morning.  No other complaints offered.       Assessment/Plan:  Bilateral lower extremity weakness Initially there was concern for Guillain-Barr syndrome due to symptom onset after she was given COVID and influenza vaccine recently. Patient underwent MRI of the brain and thoracic and lumbar spine which did not show any acute findings that could account for her weakness.  Underwent MR venogram which did not show any sinus thrombosis. Patient underwent lumbar puncture which showed 2 WBC, protein was 54 which is elevated.  Glucose was also 120 which was elevated. Patient subsequently underwent MRI cervical spine which also did not show any acute findings.  Per neurology there is no objective reason for her lower extremity weakness.  EMG and nerve conduction velocity studies could be done but will have to be done in the outpatient setting.  Neurology now recommends PT OT and rehab.  Discussed with Dr. Leonel Ramsay this morning.  PT and OT is following.  TOC is following. Since respiratory status has been stable we will discontinue further vital capacity and NIF measurements.  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  Continue SSI.  Monitor CBGs.  She is on glargine 10  units daily.  CBGs is stable for the most part.  AL amyloidosis, cutaneous Followed by Dr. Marcell Anger at [facility].  Appears to be primarily cutaneous amyloidosis based on his notes from December 2021.  Currently not on any systemic treatment.  Previously has been on thalidomide, Revlimid, melphalan.  Not on treatment since 2016. As mentioned above this was discussed with Dr. Irene Limbo.  He recommends ordering multiple myeloma panel along with light chains.  Results are pending. Not an active issue currently.  Can follow-up with her provider at  [facility] as outpatient.  Oxygen requirement/hypoxia Chest x-ray showed atelectasis.  Patient denies any chest pain shortness of breath.  No cough.  D-dimer noted to be mildly abnormal.  No lower extremity swelling noted.  Very low suspicion for venous thromboembolism.   Patient placed on incentive spirometry.  Now saturating normal on room air.    Leukocytosis/mild thrombocytopenia Significant rise in WBC noted.  She is afebrile.  No obvious signs of infection.  Recently done UA does not suggest infection.  Could be reactive.  WBC noted to be 16.4 today.  Continue to monitor clinically   Hypothyroidism Continue levothyroxine.  History of migraine headaches. Stable currently.  Was given migraine cocktail on 11/11.  Auditory hallucinations Apparently has been experiencing this for the past 8 months or so.  She is noted to be on Risperdal which was started on November 12 while she was at [facility].  Since no objective reason for her lower extremity weakness was found there is concern that this could be functional.  And now with these auditory hallucinations at it might be beneficial to have psychiatry see this patient and provide their input.  We will consult them.  Obesity Estimated body mass index is 36.91 kg/m as calculated from the following:   Height as of this encounter: 5' 7"  (1.702 m).   Weight as of this encounter: 106.9 kg.    DVT Prophylaxis: Subcutaneous heparin Code Status: Full code Family Communication: Discussed with the patient.  No family at bedside Disposition Plan:  skilled nursing facility recommended by PT and OT.  Status is: Inpatient  Remains inpatient appropriate because: Inability to ambulate, unsafe discharge     Medications: Scheduled:  dicyclomine  20 mg Oral TID AC & HS   heparin  5,000 Units Subcutaneous Q8H   insulin aspart  0-15 Units Subcutaneous TID WC   insulin aspart  0-5 Units Subcutaneous QHS   insulin aspart  5  Units Subcutaneous TID WC   insulin glargine-yfgn  10 Units Subcutaneous Daily   levothyroxine  50 mcg Oral Q0600   metoprolol tartrate  12.5 mg Oral BID   risperiDONE  1 mg Oral BID   venlafaxine XR  75 mg Oral Daily   Continuous: TOI:[id number] **OR** acetaminophen (TYLENOL) oral liquid 160 mg/5 mL **OR** acetaminophen, oxyCODONE-acetaminophen, phenol, senna-docusate  Antibiotics: Anti-infectives (From admission, onward)    None       Objective:  Vital Signs  Vitals:   05/05/21 2352 05/06/21 0400 05/06/21 0733 05/06/21 1240  BP: 133/78 127/87 (!) 155/86 (!) 149/99  Pulse: (!) 103 (!) 108 (!) 110 (!) 113  Resp: 18 20 20 20   Temp: 98.7 F (37.1 C) 98.3 F (36.8 C) 98.3 F (36.8 C) 98.4 F (36.9 C)  TempSrc: Oral Oral Oral Oral  SpO2: 93% 94% 93% 95%  Weight:      Height:        Intake/Output Summary (Last 24 hours) at  05/06/2021 1244 Last data filed at 05/05/2021 1319 Gross per 24 hour  Intake 237 ml  Output --  Net 237 ml   Filed Weights   05/01/21 1620 05/02/21 0222  Weight: 81.6 kg 106.9 kg    General appearance: Awake alert.  In no distress No lesions noted in the oral cavity or in the pharyngeal area Resp: Clear to auscultation bilaterally.  Normal effort Cardio: S1-S2 is normal regular.  No S3-S4.  No rubs murmurs or bruit GI: Abdomen is soft.  Nontender nondistended.  Bowel sounds are present normal.  No masses organomegaly Extremities: No edema.   Neurologic: Alert and oriented x3.  Decreased strength noted in both lower legs as before.  No changes.   Lab Results:  Data Reviewed: I have personally reviewed following labs and imaging studies  CBC: Recent Labs  Lab 05/01/21 1722 05/05/21 0301 05/06/21 0156  WBC 8.4 19.0* 16.4*  NEUTROABS 5.8  --   --   HGB 16.6* 15.1* 15.6*  HCT 46.0 43.3 45.5  MCV 88.3 87.8 88.7  PLT 150 117* 168     Basic Metabolic Panel: Recent Labs  Lab 05/01/21 1722 05/04/21 1214 05/05/21 0301  NA 139  137 137  K 4.0 4.4 4.1  CL 102 106 105  CO2 28 24 25   GLUCOSE 206* 208* 149*  BUN 15 22 23   CREATININE 0.63 0.94 0.79  CALCIUM 8.8* 8.3* 8.4*  MG 2.1 2.4 2.4     GFR: Estimated Creatinine Clearance: 89.4 mL/min (by C-G formula based on SCr of 0.79 mg/dL).  Liver Function Tests: Recent Labs  Lab 05/01/21 1722 05/05/21 0301  AST 34 30  ALT 28 27  ALKPHOS 84 76  BILITOT 0.8 0.7  PROT 7.9 6.3*  ALBUMIN 4.2 2.8*       CBG: Recent Labs  Lab 05/05/21 1131 05/05/21 1610 05/05/21 2110 05/06/21 0619 05/06/21 1238  GLUCAP 283* 174* 170* 189* 186*       Recent Results (from the past 240 hour(s))  Resp Panel by RT-PCR (Flu A&B, Covid) Urine, Clean Catch     Status: None   Collection Time: 05/02/21 12:06 AM   Specimen: Urine, Clean Catch; Nasopharyngeal(NP) swabs in vial transport medium  Result Value Ref Range Status   SARS Coronavirus 2 by RT PCR NEGATIVE NEGATIVE Final    Comment: (NOTE) SARS-CoV-2 target nucleic acids are NOT DETECTED.  The SARS-CoV-2 RNA is generally detectable in upper respiratory specimens during the acute phase of infection. The lowest concentration of SARS-CoV-2 viral copies this assay can detect is 138 copies/mL. A negative result does not preclude SARS-Cov-2 infection and should not be used as the sole basis for treatment or other patient management decisions. A negative result may occur with  improper specimen collection/handling, submission of specimen other than nasopharyngeal swab, presence of viral mutation(s) within the areas targeted by this assay, and inadequate number of viral copies(<138 copies/mL). A negative result must be combined with clinical observations, patient history, and epidemiological information. The expected result is Negative.  Fact Sheet for Patients:  EntrepreneurPulse.com.au  Fact Sheet for Healthcare Providers:  IncredibleEmployment.be  This test is no t yet approved  or cleared by the [place] FDA and  has been authorized for detection and/or diagnosis of SARS-CoV-2 by FDA under an Emergency Use Authorization (EUA). This EUA will remain  in effect (meaning this test can be used) for the duration of the COVID-19 declaration under Section 564(b)(1) of the Act, 21 U.S.C.section 360bbb-3(b)(1), unless the  authorization is terminated  or revoked sooner.       Influenza A by PCR NEGATIVE NEGATIVE Final   Influenza B by PCR NEGATIVE NEGATIVE Final    Comment: (NOTE) The Xpert Xpress SARS-CoV-2/FLU/RSV plus assay is intended as an aid in the diagnosis of influenza from Nasopharyngeal swab specimens and should not be used as a sole basis for treatment. Nasal washings and aspirates are unacceptable for Xpert Xpress SARS-CoV-2/FLU/RSV testing.  Fact Sheet for Patients: EntrepreneurPulse.com.au  Fact Sheet for Healthcare Providers: IncredibleEmployment.be  This test is not yet approved or cleared by the [place] FDA and has been authorized for detection and/or diagnosis of SARS-CoV-2 by FDA under an Emergency Use Authorization (EUA). This EUA will remain in effect (meaning this test can be used) for the duration of the COVID-19 declaration under Section 564(b)(1) of the Act, 21 U.S.C. section 360bbb-3(b)(1), unless the authorization is terminated or revoked.  Performed at [facility], [address], [place], [place] [address]   MRSA Next Gen by PCR, Nasal     Status: None   Collection Time: 05/02/21  9:11 PM   Specimen: Nasal Mucosa; Nasal Swab  Result Value Ref Range Status   MRSA by PCR Next Gen NOT DETECTED NOT DETECTED Final    Comment: (NOTE) The GeneXpert MRSA Assay (FDA approved for NASAL specimens only), is one component of a comprehensive MRSA colonization surveillance program. It is not intended to diagnose MRSA infection nor to guide or monitor treatment for MRSA infections. Test  performance is not FDA approved in patients less than 30 years old. Performed at [facility], [address] [place], [place] [address]        Radiology Studies: MR CERVICAL SPINE WO CONTRAST  Result Date: 05/06/2021 CLINICAL DATA:  Initial evaluation for myelopathy, acute or progressive. EXAM: MRI CERVICAL SPINE WITHOUT CONTRAST TECHNIQUE: Multiplanar, multisequence MR imaging of the cervical spine was performed. No intravenous contrast was administered. COMPARISON:  None. FINDINGS: Alignment: Examination degraded by motion artifact. Straightening with reversal of the normal cervical lordosis. No listhesis. Vertebrae: Vertebral body height maintained without acute or chronic fracture. Bone marrow signal intensity heterogeneous without worrisome osseous lesion. No abnormal marrow edema. Cord: Signal intensity within the cervical spinal cord grossly within normal limits. No convincing cord signal changes on this motion degraded exam. Posterior Fossa, vertebral arteries, paraspinal tissues: Probable chronic microvascular ischemic disease noted within the partially visualized pons and brainstem. Visualized brain and posterior fossa otherwise unremarkable. Craniocervical junction normal. Paraspinous soft tissues within normal limits. Normal flow voids seen within the vertebral arteries bilaterally. 5 mm left thyroid nodule noted, of doubtful significance given size and patient age, no follow-up imaging recommended (ref: J Am Coll Radiol. 2015 Feb;12(2): 143-50). Disc levels: C2-C3: Negative interspace. Left-sided facet hypertrophy. No stenosis. C3-C4: Negative interspace. Left greater than right facet and ligament flavum hypertrophy. Mild ligament flavum thickening. Resultant mild spinal stenosis. Mild left C4 foraminal narrowing. Right neural foramen remains patent. C4-C5: Mild disc bulge with endplate spurring. Left-sided facet arthrosis. Mild spinal stenosis. Mild bilateral C5 foraminal narrowing. C5-C6:  Advanced degenerative intervertebral disc space narrowing with circumferential disc osteophyte. Flattening and partial effacement of the ventral thecal sac with resultant moderate spinal stenosis. Superimposed left facet and ligament flavum hypertrophy. Moderate bilateral C6 foraminal narrowing. C6-C7: Degenerative intervertebral disc space narrowing with diffuse disc osteophyte complex, slightly asymmetric to the left. No significant spinal stenosis. Mild left C7 foraminal narrowing. C7-T1: Mild disc bulge with uncovertebral spurring. Superimposed left foraminal disc  protrusion (series 2, image 14). Mild facet hypertrophy. No spinal stenosis. Mild to moderate left C8 foraminal narrowing. Right foramen remains patent. IMPRESSION: 1. Motion degraded exam. 2. Normal MRI of the cervical spinal cord. No cord signal changes to suggest myelopathy. 3. Multilevel cervical spondylosis with resultant mild to moderate spinal stenosis at C3-4 through C5-6, most pronounced at C5-6. Associated mild left C4 and bilateral C5 foraminal narrowing, moderate bilateral C6 foraminal stenosis, with mild left C7 and C8 foraminal narrowing. Electronically Signed   By: Jeannine Boga M.D.   On: 05/06/2021 05:13   DG CHEST PORT 1 VIEW  Result Date: 05/04/2021 CLINICAL DATA:  Hypoxia EXAM: PORTABLE CHEST 1 VIEW COMPARISON:  None. FINDINGS: Mildly enlarged cardiomediastinal silhouette. Low lung volumes. Bibasilar subsegmental axis. No large effusion or visible pneumothorax. No acute osseous abnormality. IMPRESSION: Low lung volumes with bibasilar atelectasis. Electronically Signed   By: Maurine Simmering M.D.   On: 05/04/2021 19:21       LOS: 1 day   [facility] Pager on [email or web address]  05/06/2021, 12:44 PM

## 2021-05-06 NOTE — Progress Notes (Signed)
Pt achieved -18 on NIF and 1 L on FVC. Pt effort was ok.

## 2021-05-06 NOTE — Progress Notes (Signed)
Patient was sleeping when I entered the room, but she did awake and interact with me.  Her exam is relatively unchanged with preserved upper extremity strength, and an effort dependent lower extremity exam.  She continues to have preserved reflexes in the upper extremities as well as at the knees(may be slightly depressed of the knees).  I continue to have very low suspicion for Guillain-Barr syndrome, and think that her elevated CSF protein is incidental.  I encouraged her to continue working with PT and OT.  Impression: 65 year old female with relatively abrupt onset bilateral lower extremity discoordination/weakness.  With intact reflexes except at the ankle, I do not think that Guillain-Barr syndrome is at all likely and I would not favor empiric IVIG.  Her elevated protein is very nonspecific, and can be seen with both amyloidosis and diabetes, both of which she has.    One other consideration, given that she has a fairly impaired gait at baseline would be that she may have had some degree of worsening due to her recent start of Risperdal, with significant embellishment superimposed on this.  If she continues to not make progress with PT/OT, could consider discontinuing the Risperdal.  Neurology will be available on an as-needed basis, please call with further questions or concerns.  Ritta Slot, MD [facility] [phone]  If 7pm- 7am, please page neurology on call as listed in AMION.

## 2021-05-06 NOTE — TOC Initial Note (Signed)
Transition of Care [facility]) - Initial/Assessment Note    Patient Details  Name: Sheryl Henry MRN: [id number] Date of Birth: 03-04-1956  Transition of Care [facility]) CM/SW Contact:    Geralynn Ochs, LCSW Phone Number: 05/06/2021, 4:13 PM  Clinical Narrative:         CSW met with patient to discuss SNF placement. [place] CSW discussed barriers to placement that far, including transportation, and patient said she couldn't private pay for transportation. Patient would like time to look over options available and would also consider options in [place]. CSW to fax out referral further, will follow up with patient for SNF choice.          Expected Discharge Plan: Skilled Nursing Facility Barriers to Discharge: Continued Medical Work up, Ship broker   Patient Goals and CMS Choice Patient states their goals for this hospitalization and ongoing recovery are:: to get better CMS Medicare.gov Compare Post Acute Care list provided to:: Patient Choice offered to / list presented to : Patient  Expected Discharge Plan and Services Expected Discharge Plan: [place] Choice: [place] arrangements for the past 2 months: Single Family Home                                      Prior Living Arrangements/Services Living arrangements for the past 2 months: Single Family Home Lives with:: Self Patient language and need for interpreter reviewed:: No Do you feel safe going back to the place where you live?: Yes      Need for Family Participation in Patient Care: No (Comment) Care giver support system in place?: No (comment)   Criminal Activity/Legal Involvement Pertinent to Current Situation/Hospitalization: No - Comment as needed  Activities of Daily Living Home Assistive Devices/Equipment: CBG Meter, Eyeglasses, Shower chair with back, Walker (specify type) ADL  Screening (condition at time of admission) Patient's cognitive ability adequate to safely complete daily activities?: Yes Is the patient deaf or have difficulty hearing?: No Does the patient have difficulty seeing, even when wearing glasses/contacts?: No Does the patient have difficulty concentrating, remembering, or making decisions?: Yes Patient able to express need for assistance with ADLs?: Yes Does the patient have difficulty dressing or bathing?: Yes Independently performs ADLs?: Yes (appropriate for developmental age) (patient states she does everything at home but struggles due to weakness and being off balance) Does the patient have difficulty walking or climbing stairs?: Yes Weakness of Legs: Both Weakness of Arms/Hands: None  Permission Sought/Granted Permission sought to share information with : Facility Art therapist granted to share information with : Yes, Verbal Permission Granted     Permission granted to share info w AGENCY: SNF        Emotional Assessment Appearance:: Appears stated age Attitude/Demeanor/Rapport: Engaged Affect (typically observed): Appropriate Orientation: : Oriented to Place, Oriented to Self, Oriented to  Time, Oriented to Situation Alcohol / Substance Use: Not Applicable Psych Involvement: Yes (comment)  Admission diagnosis:  Ataxia [R27.0] CVA (cerebral vascular accident) [facility]) [I63.9] Lower extremity weakness [R29.898] Patient Active Problem List   Diagnosis Date Noted   Lower extremity weakness 05/05/2021   Diabetes mellitus, type 2 ([place]) 05/02/2021   Hypertension 05/02/2021   Leg weakness, bilateral 05/02/2021   Migraine headache with aura 05/02/2021   Ataxia 05/02/2021   Cutaneous amyloidosis ([place])  Vasovagal syncope    PCP:  [name], NP Pharmacy:   [facility], [address] [place] [address] Phone: [phone] Fax: [phone]     Social  Determinants of Health (SDOH) Interventions    Readmission Risk Interventions No flowsheet data found.

## 2021-05-06 NOTE — [place] FL2 (Signed)
[place] MEDICAID FL2 LEVEL OF CARE SCREENING TOOL     IDENTIFICATION  Patient Name: Sheryl Henry Birthdate: 03-22-1956 Sex: female Admission Date (Current Location): 05/01/2021  [facility] and [place] Number:  [employer] and Address:  The [place], [address] [place], [place] [address]      Provider Number: [id number]  Attending Physician Name and Address:  Osvaldo Shipper, MD  Relative Name and Phone Number:       Current Level of Care: Hospital Recommended Level of Care: Skilled Nursing Facility Prior Approval Number:    Date Approved/Denied:   PASRR Number: [id number]  Discharge Plan: SNF    Current Diagnoses: Patient Active Problem List   Diagnosis Date Noted   Lower extremity weakness 05/05/2021   Diabetes mellitus, type 2 (HCC) 05/02/2021   Hypertension 05/02/2021   Leg weakness, bilateral 05/02/2021   Migraine headache with aura 05/02/2021   Ataxia 05/02/2021   Cutaneous amyloidosis (HCC)    Vasovagal syncope     Orientation RESPIRATION BLADDER Height & Weight     Self, Time, Situation, Place  Normal Incontinent Weight: 235 lb 10.8 oz (106.9 kg) Height:  5\' 7"  (170.2 cm)  BEHAVIORAL SYMPTOMS/MOOD NEUROLOGICAL BOWEL NUTRITION STATUS      Incontinent Diet (see DC summary)  AMBULATORY STATUS COMMUNICATION OF NEEDS Skin   Extensive Assist Verbally Normal                       Personal Care Assistance Level of Assistance  Bathing, Feeding, Dressing Bathing Assistance: Limited assistance Feeding assistance: Independent Dressing Assistance: Limited assistance     Functional Limitations Info  Speech     Speech Info: Impaired (dysarthria)    SPECIAL CARE FACTORS FREQUENCY  PT (By licensed PT), OT (By licensed OT)     PT Frequency: 5x/wk OT Frequency: 5x/wk            Contractures Contractures Info: Not present    Additional Factors Info  Code Status, Allergies, Psychotropic, Insulin  Sliding Scale Code Status Info: Full Allergies Info: Ivp Dye (Iodinated Diagnostic Agents) Psychotropic Info: Risperdal 1mg  2x/day; Effexor XR 75mg  dialy Insulin Sliding Scale Info: see DC summary       Current Medications (05/06/2021):  This is the current hospital active medication list Current Facility-Administered Medications  Medication Dose Route Frequency Provider Last Rate Last Admin   acetaminophen (TYLENOL) tablet 650 mg  650 mg Oral Q4H PRN Zierle-Ghosh, Asia B, DO   650 mg at 05/06/21 0621   Or   acetaminophen (TYLENOL) 160 MG/5ML solution 650 mg  650 mg Per Tube Q4H PRN Zierle-Ghosh, Asia B, DO       Or   acetaminophen (TYLENOL) suppository 650 mg  650 mg Rectal Q4H PRN Zierle-Ghosh, Asia B, DO       dicyclomine (BENTYL) tablet 20 mg  20 mg Oral TID AC & HS Johnson, Clanford L, MD   20 mg at 05/06/21 0837   heparin injection 5,000 Units  5,000 Units Subcutaneous Q8H Johnson, Clanford L, MD   5,000 Units at 05/06/21 0620   insulin aspart (novoLOG) injection 0-15 Units  0-15 Units Subcutaneous TID WC Zierle-Ghosh, Asia B, DO   3 Units at 05/06/21 0620   insulin aspart (novoLOG) injection 0-5 Units  0-5 Units Subcutaneous QHS Zierle-Ghosh, Asia B, DO   2 Units at 05/04/21 2218   insulin aspart (novoLOG) injection 5 Units  5 Units Subcutaneous TID WC Johnson, Clanford  L, MD   5 Units at 05/06/21 0837   insulin glargine-yfgn (SEMGLEE) injection 10 Units  10 Units Subcutaneous Daily Standley Dakins L, MD   10 Units at 05/06/21 0837   levothyroxine (SYNTHROID) tablet 50 mcg  50 mcg Oral Q0600 Standley Dakins L, MD   50 mcg at 05/06/21 2458   metoprolol tartrate (LOPRESSOR) tablet 12.5 mg  12.5 mg Oral BID Osvaldo Shipper, MD   12.5 mg at 05/06/21 0998   oxyCODONE-acetaminophen (PERCOCET/ROXICET) 5-325 MG per tablet 1 tablet  1 tablet Oral QID PRN Standley Dakins L, MD   1 tablet at 05/05/21 2129   phenol (CHLORASEPTIC) mouth spray 1 spray  1 spray Mouth/Throat PRN Osvaldo Shipper,  MD       risperiDONE (RISPERDAL M-TABS) disintegrating tablet 1 mg  1 mg Oral BID Johnson, Clanford L, MD   1 mg at 05/06/21 3382   senna-docusate (Senokot-S) tablet 1 tablet  1 tablet Oral QHS PRN Zierle-Ghosh, Asia B, DO       venlafaxine XR (EFFEXOR-XR) 24 hr capsule 75 mg  75 mg Oral Daily Johnson, Clanford L, MD   75 mg at 05/06/21 5053     Discharge Medications: Please see discharge summary for a list of discharge medications.  Relevant Imaging Results:  Relevant Lab Results:   Additional Information SS#: [id number]  Baldemar Lenis, LCSW

## 2021-05-07 ENCOUNTER — Inpatient Hospital Stay (HOSPITAL_COMMUNITY): Payer: Medicare HMO

## 2021-05-07 DIAGNOSIS — R440 Auditory hallucinations: Secondary | ICD-10-CM

## 2021-05-07 DIAGNOSIS — I10 Essential (primary) hypertension: Secondary | ICD-10-CM | POA: Diagnosis not present

## 2021-05-07 DIAGNOSIS — F600 Paranoid personality disorder: Secondary | ICD-10-CM

## 2021-05-07 LAB — CBC
HCT: 47.3 % — ABNORMAL HIGH (ref 36.0–46.0)
Hemoglobin: 16.3 g/dL — ABNORMAL HIGH (ref 12.0–15.0)
MCH: 30.2 pg (ref 26.0–34.0)
MCHC: 34.5 g/dL (ref 30.0–36.0)
MCV: 87.8 fL (ref 80.0–100.0)
Platelets: 188 10*3/uL (ref 150–400)
RBC: 5.39 MIL/uL — ABNORMAL HIGH (ref 3.87–5.11)
RDW: 12.8 % (ref 11.5–15.5)
WBC: 16.1 10*3/uL — ABNORMAL HIGH (ref 4.0–10.5)
nRBC: 0 % (ref 0.0–0.2)

## 2021-05-07 LAB — URINALYSIS, ROUTINE W REFLEX MICROSCOPIC
Bilirubin Urine: NEGATIVE
Glucose, UA: NEGATIVE mg/dL
Ketones, ur: 20 mg/dL — AB
Nitrite: NEGATIVE
Protein, ur: NEGATIVE mg/dL
Specific Gravity, Urine: 1.026 (ref 1.005–1.030)
WBC, UA: >50 WBC/hpf — ABNORMAL HIGH (ref 0–5)
pH: 5 (ref 5.0–8.0)

## 2021-05-07 LAB — VITAMIN B12: Vitamin B-12: 1619 pg/mL — ABNORMAL HIGH (ref 180–914)

## 2021-05-07 LAB — GLUCOSE, CAPILLARY
Glucose-Capillary: 159 mg/dL — ABNORMAL HIGH (ref 70–99)
Glucose-Capillary: 166 mg/dL — ABNORMAL HIGH (ref 70–99)
Glucose-Capillary: 163 mg/dL — ABNORMAL HIGH (ref 70–99)
Glucose-Capillary: 140 mg/dL — ABNORMAL HIGH (ref 70–99)

## 2021-05-07 LAB — ABO/RH: ABO/RH(D): O POS

## 2021-05-07 LAB — BASIC METABOLIC PANEL
Anion gap: 13 (ref 5–15)
BUN: 32 mg/dL — ABNORMAL HIGH (ref 8–23)
CO2: 21 mmol/L — ABNORMAL LOW (ref 22–32)
Calcium: 8.5 mg/dL — ABNORMAL LOW (ref 8.9–10.3)
Chloride: 100 mmol/L (ref 98–111)
Creatinine, Ser: 0.77 mg/dL (ref 0.44–1.00)
GFR, Estimated: >60 mL/min (ref >60)
Glucose, Bld: 162 mg/dL — ABNORMAL HIGH (ref 70–99)
Potassium: 3.8 mmol/L (ref 3.5–5.1)
Sodium: 134 mmol/L — ABNORMAL LOW (ref 135–145)

## 2021-05-07 LAB — HEMOGLOBIN AND HEMATOCRIT, BLOOD
HCT: 47.2 % — ABNORMAL HIGH (ref 36.0–46.0)
Hemoglobin: 16.4 g/dL — ABNORMAL HIGH (ref 12.0–15.0)

## 2021-05-07 LAB — TYPE AND SCREEN
ABO/RH(D): O POS
Antibody Screen: NEGATIVE

## 2021-05-07 LAB — OLIGOCLONAL BANDS, CSF + SERM

## 2021-05-07 LAB — PROCALCITONIN: Procalcitonin: 0.13 ng/mL

## 2021-05-07 LAB — FOLATE: Folate: 35.5 ng/mL (ref >5.9)

## 2021-05-07 IMAGING — DX DG CHEST 1V
1 series · 1 of 1 positions shown · non-contrast
Comparison: Chest radiograph [DATE]

CLINICAL DATA: Leukocytosis

EXAM:
CHEST  1 VIEW

[chest ap]
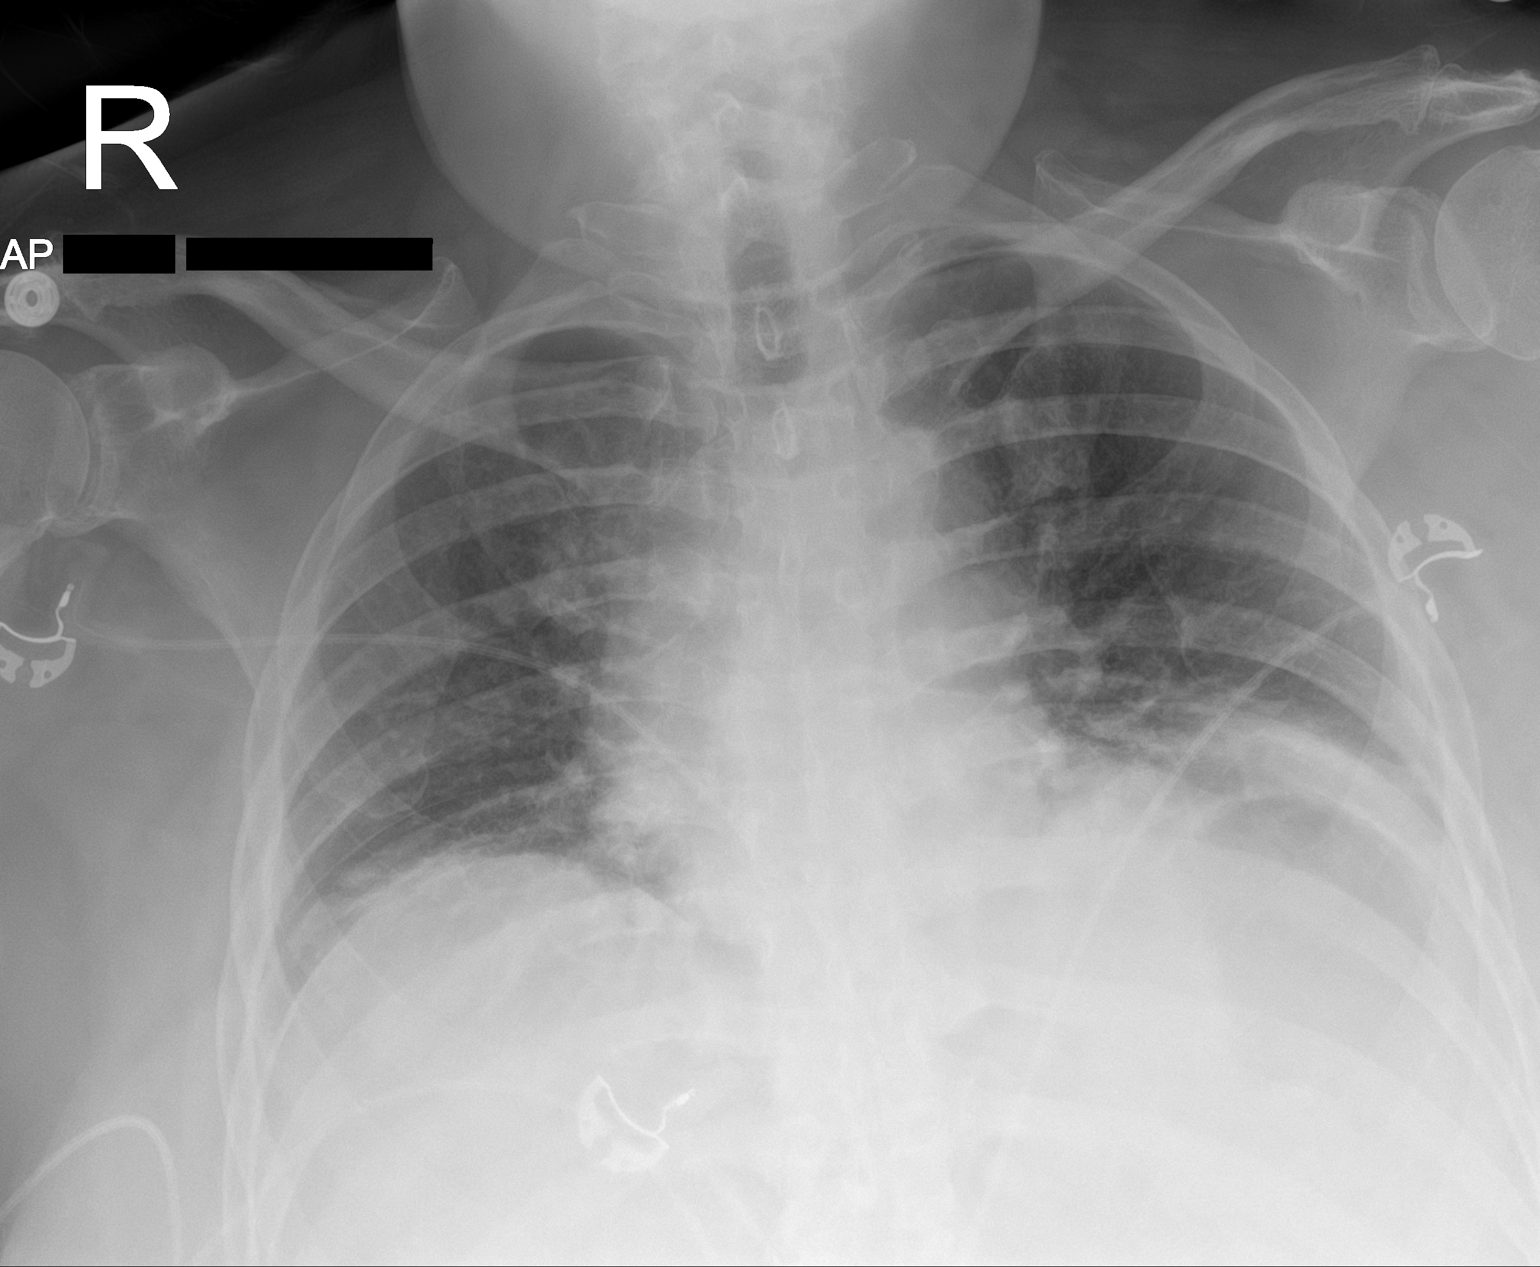

[1 of 1 positions shown; findings below may reference images not displayed]

FINDINGS: The cardiomediastinal silhouette is stable.

Lung volumes are low. Linear opacities in the lung bases are favored
to reflect subsegmental atelectasis. The upper lungs are well
aerated. There is no pleural effusion. There is no pneumothorax.

The bones are stable.
IMPRESSION: Low lung volumes with linear opacities in the lung bases favored to
reflect atelectasis.

## 2021-05-07 MED ORDER — PANTOPRAZOLE SODIUM 40 MG IV SOLR
40.0000 mg | Freq: Two times a day (BID) | INTRAVENOUS | Status: DC
  Administered 2021-05-07 – 2021-05-08 (×2): 40 mg via INTRAVENOUS
  Filled 2021-05-07 (×2): qty 40

## 2021-05-07 MED ORDER — SODIUM CHLORIDE 0.9 % IV SOLN
1.0000 g | INTRAVENOUS | Status: DC
  Administered 2021-05-07 – 2021-05-12 (×6): 1 g via INTRAVENOUS
  Filled 2021-05-07 (×6): qty 10

## 2021-05-07 NOTE — Progress Notes (Signed)
PROGRESS NOTE    Sheryl Henry  IRC:[id number] DOB: 1955/12/22 DOA: 05/01/2021 PCP: [name], NP   Chief Complaint  Patient presents with   Hallucinations    Auditory hallucination started after halloween.  "Felt sound-wave in head about one hour away.  Pt thinks neighbors are watching her with cameras in her house.     Brief Narrative/Hospital Course:  Sheryl Henry, 66 y.o. female with PMH of  T2DM, HTN presented to the ED with ringing in her head-1 day prior to presentation, also thinking that her neighbors were watching her with cameras in her house.She reports she heard a high-volume sound in her head and felt like it made her whole head vibrate.  After that she had barely any sleep all night.  Symptoms seem to resolve and then the day of presentation it happened again.  Around lunchtime she had an intense feeling in her head.  She does not characterize it as pain, but cannot describe the sensation.  She laid down to take a nap at 2:00.  When she woke up she had the high-volume sound in her head again.  She reports that it happened so fast she cannot describe the characteristics of it.  He goes "through her head" but she cannot describe where it starts and finishes.  She says that the feeling that she has after that is like her brain needs a repeat.  It is an intense feeling and she does not know how to make it go away.  She knows for sure the soundwave brings it on.  The feeling that her brain needs to remove it is in both of her temples.  She again says that its not the pain.  Per patient, this soundwave is her main concern. Son had told the ER that she has had this before and she has been off some of her psych medications.  They were planning to discharge her home with follow-up to psychiatry, patient then announced that she has not been able to walk all day. She reports she feels like she is staggering.  She does not feel dizzy or like the room is spinning.   Her LE weakness  worsened after receiving the flu and covid vaccines recently. Patient was admitted. Seen by psychiatry, neurology-recommend MRI cervical spine-that was negative for acute significant findings.  Subjective: Seen and examined this morning.  She is alert awake. She is communicating with whispering. Able to move all her extremities well Complains of sore throat causing speech issues and also complains of headache  Assessment & Plan:  LE weakness abrupt onset:MRI cervical spine no acute significant finding.  Seen by neurology.  Had a lumbar puncture, elevated protein level could be nonspecific.  Less concern about GBS.  Differentials could include some degree of worsening due to recent use of Risperdal, deconditioning also has neuropathy. Advised PT OT, and skilled nursing facility placement.  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  Blood sugar control and glargine 10 units and SSI.  Monitor CBG Recent Labs  Lab 05/06/21 0619 05/06/21 1238 05/06/21 1606 05/06/21 2135 05/07/21 0659  GLUCAP 189* 186* 181* 131* 159*    AL amyloidosis cutaneous: Followed by Dr. Elnoria Howard at [facility] not on systemic treatment previously on thalidomide Revlimid melphalan, was discussed with Dr. Arbie Cookey recommended ordering multiple myeloma panel along with light chains,-multiple myeloma panel pending, kappa/lambda light chain ratio 1.1 normal although kappa free light chain  elevated at one 27.3, patient will need outpatient follow-up.  Acute hypoxia chest x-ray with atelectasis no chest pain or shortness of breath.  Mildly abnormal D-dimer no lower extremity swelling chest pain low suspicion for PTE.  Weaned off oxygen continue I-S and ambulation  Leukocytosis/mild thrombocytopenia: Patient is afebrile no obvious signs of infection recent UA does not suggest infection monitor CBC.  Given worsening WBC count repeat UA and chest  x-ray.  Patient is incontinent. Recent Labs  Lab 05/05/21 0301 05/06/21 0156 05/07/21 0203  HGB 15.1* 15.6* 16.3*  HCT 43.3 45.5 47.3*  WBC 19.0* 16.4* 16.1*  PLT 117* 168 188    Hypothyroidism on levothyroxine  Migraine headache some headache this morning, S/P migraine cocktail on 11/11.  Symptomatic management.  Psych issues/auditary hallucination anxiety/depression on Risperdal venlafaxine, seen by psychiatry, low risk.  Plan is for skilled nursing facility  Hypertension: BP well controlled this morning.  Continue metoprolol..  Class II Obesity:Patient's Body mass index is 36.91 kg/m. : Will benefit with PCP follow-up, weight loss  healthy lifestyle and outpatient sleep evaluation.  DVT prophylaxis: heparin injection 5,000 Units Start: 05/04/21 0600 Place and maintain sequential compression device Start: 05/03/21 0714 SCD's Start: 05/02/21 0238 Code Status:   Code Status: Full Code Family Communication: plan of care discussed with patient at bedside. Status is: Inpatient Remains inpatient appropriate because: For ongoing management of bilateral lower extremity weakness, need for SNF Disposition: Currently is not medically stable for discharge. Anticipated Disposition: SNF IN 24 HRS once bed available  Objective: Vitals last 24 hrs: Vitals:   05/06/21 1953 05/07/21 0007 05/07/21 0336 05/07/21 0750  BP: (!) 150/90 (!) 165/99 (!) 150/81 136/62  Pulse: (!) 110 (!) 109 (!) 106 (!) 109  Resp: 18 20 20 14   Temp: 00.1 F (36.7 C) 98.5 F (36.9 C) 99.5 F (37.5 C) 98.2 F (36.8 C)  TempSrc: Oral Oral Oral Oral  SpO2: 92% 93% 94% 92%  Weight:      Height:       Weight change:   Intake/Output Summary (Last 24 hours) at 05/07/2021 0754 Last data filed at 05/06/2021 2230 Gross per 24 hour  Intake 240 ml  Output --  Net 240 ml   Net IO Since Admission: 513.6 mL [05/07/21 0754]   Physical Examination: General exam: AAO, muffled speech- speaking in low tone, at times  whispering HEENT:Oral mucosa moist, Ear/Nose WNL grossly,dentition normal. Respiratory system: B/l clear BS, no use of accessory muscle, non tender. Cardiovascular system: S1 & S2 +,No JVD. Gastrointestinal system: Abdomen soft, NT,ND, BS+. Nervous System:Alert, awake, moving extremities upper and lower extremity, incontinent,AAO, muffled speech- speaking in low tone, at times whispering Extremities: edema none, distal peripheral pulses palpable.  Skin: No rashes, no icterus. MSK: Normal muscle bulk, tone, power.  Medications reviewed: Scheduled Meds:  dicyclomine  20 mg Oral TID AC & HS   heparin  5,000 Units Subcutaneous Q8H   insulin aspart  0-15 Units Subcutaneous TID WC   insulin aspart  0-5 Units Subcutaneous QHS   insulin aspart  5 Units Subcutaneous TID WC   insulin glargine-yfgn  10 Units Subcutaneous Daily   levothyroxine  50 mcg Oral Q0600   metoprolol tartrate  12.5 mg Oral BID   risperiDONE  1 mg Oral BID   venlafaxine XR  75 mg Oral Daily   Continuous Infusions:  Diet Order             Diet heart healthy/carb modified Room service appropriate? Yes; Fluid  consistency: Thin  Diet effective now                  Weight change:   Wt Readings from Last 3 Encounters:  05/02/21 106.9 kg     Consultants:see note  Procedures:see note Antimicrobials: Anti-infectives (From admission, onward)    None      Culture/Microbiology No results found for: SDES, SPECREQUEST, CULT, REPTSTATUS  Other culture-see note  Unresulted Labs (From admission, onward)     Start     Ordered   05/04/21 0932  Multiple Myeloma Panel (SPEP&IFE w/QIG)  Once,   R       Question:  Specimen collection method  Answer:  Lab=Lab collect   05/04/21 0932   05/02/21 1730  Oligoclonal bands, CSF + serum  (Oligoclonal Bands, CSF + Serum panel)  Once,   R        05/02/21 1729   05/02/21 1730  Draw extra clot tube  (Oligoclonal Bands, CSF + Serum panel)  Once,   R        05/02/21 1729           Data Reviewed: I have personally reviewed following labs and imaging studies CBC: Recent Labs  Lab 05/01/21 1722 05/05/21 0301 05/06/21 0156 05/07/21 0203  WBC 8.4 19.0* 16.4* 16.1*  NEUTROABS 5.8  --   --   --   HGB 16.6* 15.1* 15.6* 16.3*  HCT 46.0 43.3 45.5 47.3*  MCV 88.3 87.8 88.7 87.8  PLT 150 117* 168 102   Basic Metabolic Panel: Recent Labs  Lab 05/01/21 1722 05/04/21 1214 05/05/21 0301 05/07/21 0203  NA 139 137 137 134*  K 4.0 4.4 4.1 3.8  CL 102 106 105 100  CO2 28 24 25  21*  GLUCOSE 206* 208* 149* 162*  BUN 15 22 23  32*  CREATININE 0.63 0.94 0.79 0.77  CALCIUM 8.8* 8.3* 8.4* 8.5*  MG 2.1 2.4 2.4  --    GFR: Estimated Creatinine Clearance: 89.4 mL/min (by C-G formula based on SCr of 0.77 mg/dL). Liver Function Tests: Recent Labs  Lab 05/01/21 1722 05/05/21 0301  AST 34 30  ALT 28 27  ALKPHOS 84 76  BILITOT 0.8 0.7  PROT 7.9 6.3*  ALBUMIN 4.2 2.8*   No results for input(s): LIPASE, AMYLASE in the last 168 hours. No results for input(s): AMMONIA in the last 168 hours. Coagulation Profile: No results for input(s): INR, PROTIME in the last 168 hours. Cardiac Enzymes: No results for input(s): CKTOTAL, CKMB, CKMBINDEX, TROPONINI in the last 168 hours. BNP (last 3 results) No results for input(s): PROBNP in the last 8760 hours. HbA1C: No results for input(s): HGBA1C in the last 72 hours. CBG: Recent Labs  Lab 05/06/21 0619 05/06/21 1238 05/06/21 1606 05/06/21 2135 05/07/21 0659  GLUCAP 189* 186* 181* 131* 159*   Lipid Profile: No results for input(s): CHOL, HDL, LDLCALC, TRIG, CHOLHDL, LDLDIRECT in the last 72 hours. Thyroid Function Tests: No results for input(s): TSH, T4TOTAL, FREET4, T3FREE, THYROIDAB in the last 72 hours. Anemia Panel: Recent Labs    05/07/21 0203  VITAMINB12 1,619*  FOLATE 35.5   Sepsis Labs: No results for input(s): PROCALCITON, LATICACIDVEN in the last 168 hours.  Recent Results (from the past 240  hour(s))  Resp Panel by RT-PCR (Flu A&B, Covid) Urine, Clean Catch     Status: None   Collection Time: 05/02/21 12:06 AM   Specimen: Urine, Clean Catch; Nasopharyngeal(NP) swabs in vial transport medium  Result Value Ref Range  Status   SARS Coronavirus 2 by RT PCR NEGATIVE NEGATIVE Final    Comment: (NOTE) SARS-CoV-2 target nucleic acids are NOT DETECTED.  The SARS-CoV-2 RNA is generally detectable in upper respiratory specimens during the acute phase of infection. The lowest concentration of SARS-CoV-2 viral copies this assay can detect is 138 copies/mL. A negative result does not preclude SARS-Cov-2 infection and should not be used as the sole basis for treatment or other patient management decisions. A negative result may occur with  improper specimen collection/handling, submission of specimen other than nasopharyngeal swab, presence of viral mutation(s) within the areas targeted by this assay, and inadequate number of viral copies(<138 copies/mL). A negative result must be combined with clinical observations, patient history, and epidemiological information. The expected result is Negative.  Fact Sheet for Patients:  EntrepreneurPulse.com.au  Fact Sheet for Healthcare Providers:  IncredibleEmployment.be  This test is no t yet approved or cleared by the [place] FDA and  has been authorized for detection and/or diagnosis of SARS-CoV-2 by FDA under an Emergency Use Authorization (EUA). This EUA will remain  in effect (meaning this test can be used) for the duration of the COVID-19 declaration under Section 564(b)(1) of the Act, 21 U.S.C.section 360bbb-3(b)(1), unless the authorization is terminated  or revoked sooner.       Influenza A by PCR NEGATIVE NEGATIVE Final   Influenza B by PCR NEGATIVE NEGATIVE Final    Comment: (NOTE) The Xpert Xpress SARS-CoV-2/FLU/RSV plus assay is intended as an aid in the diagnosis of influenza from  Nasopharyngeal swab specimens and should not be used as a sole basis for treatment. Nasal washings and aspirates are unacceptable for Xpert Xpress SARS-CoV-2/FLU/RSV testing.  Fact Sheet for Patients: EntrepreneurPulse.com.au  Fact Sheet for Healthcare Providers: IncredibleEmployment.be  This test is not yet approved or cleared by the [place] FDA and has been authorized for detection and/or diagnosis of SARS-CoV-2 by FDA under an Emergency Use Authorization (EUA). This EUA will remain in effect (meaning this test can be used) for the duration of the COVID-19 declaration under Section 564(b)(1) of the Act, 21 U.S.C. section 360bbb-3(b)(1), unless the authorization is terminated or revoked.  Performed at [facility], [address] [place], [place] [address]   MRSA Next Gen by PCR, Nasal     Status: None   Collection Time: 05/02/21  9:11 PM   Specimen: Nasal Mucosa; Nasal Swab  Result Value Ref Range Status   MRSA by PCR Next Gen NOT DETECTED NOT DETECTED Final    Comment: (NOTE) The GeneXpert MRSA Assay (FDA approved for NASAL specimens only), is one component of a comprehensive MRSA colonization surveillance program. It is not intended to diagnose MRSA infection nor to guide or monitor treatment for MRSA infections. Test performance is not FDA approved in patients less than 43 years old. Performed at [facility], [address] [place], [place] [address]      Radiology Studies: MR CERVICAL SPINE WO CONTRAST  Result Date: 05/06/2021 CLINICAL DATA:  Initial evaluation for myelopathy, acute or progressive. EXAM: MRI CERVICAL SPINE WITHOUT CONTRAST TECHNIQUE: Multiplanar, multisequence MR imaging of the cervical spine was performed. No intravenous contrast was administered. COMPARISON:  None. FINDINGS: Alignment: Examination degraded by motion artifact. Straightening with reversal of the normal cervical lordosis. No listhesis. Vertebrae:  Vertebral body height maintained without acute or chronic fracture. Bone marrow signal intensity heterogeneous without worrisome osseous lesion. No abnormal marrow edema. Cord: Signal intensity within the cervical spinal cord grossly within normal limits.  No convincing cord signal changes on this motion degraded exam. Posterior Fossa, vertebral arteries, paraspinal tissues: Probable chronic microvascular ischemic disease noted within the partially visualized pons and brainstem. Visualized brain and posterior fossa otherwise unremarkable. Craniocervical junction normal. Paraspinous soft tissues within normal limits. Normal flow voids seen within the vertebral arteries bilaterally. 5 mm left thyroid nodule noted, of doubtful significance given size and patient age, no follow-up imaging recommended (ref: J Am Coll Radiol. 2015 Feb;12(2): 143-50). Disc levels: C2-C3: Negative interspace. Left-sided facet hypertrophy. No stenosis. C3-C4: Negative interspace. Left greater than right facet and ligament flavum hypertrophy. Mild ligament flavum thickening. Resultant mild spinal stenosis. Mild left C4 foraminal narrowing. Right neural foramen remains patent. C4-C5: Mild disc bulge with endplate spurring. Left-sided facet arthrosis. Mild spinal stenosis. Mild bilateral C5 foraminal narrowing. C5-C6: Advanced degenerative intervertebral disc space narrowing with circumferential disc osteophyte. Flattening and partial effacement of the ventral thecal sac with resultant moderate spinal stenosis. Superimposed left facet and ligament flavum hypertrophy. Moderate bilateral C6 foraminal narrowing. C6-C7: Degenerative intervertebral disc space narrowing with diffuse disc osteophyte complex, slightly asymmetric to the left. No significant spinal stenosis. Mild left C7 foraminal narrowing. C7-T1: Mild disc bulge with uncovertebral spurring. Superimposed left foraminal disc protrusion (series 2, image 14). Mild facet hypertrophy. No  spinal stenosis. Mild to moderate left C8 foraminal narrowing. Right foramen remains patent. IMPRESSION: 1. Motion degraded exam. 2. Normal MRI of the cervical spinal cord. No cord signal changes to suggest myelopathy. 3. Multilevel cervical spondylosis with resultant mild to moderate spinal stenosis at C3-4 through C5-6, most pronounced at C5-6. Associated mild left C4 and bilateral C5 foraminal narrowing, moderate bilateral C6 foraminal stenosis, with mild left C7 and C8 foraminal narrowing. Electronically Signed   By: Jeannine Boga M.D.   On: 05/06/2021 05:13     LOS: 2 days   Antonieta Pert, MD [facility]  05/07/2021, 7:54 AM

## 2021-05-07 NOTE — Progress Notes (Signed)
Nif = -18 VC = 1.28 Liters  Good effort and understanding by the patient.

## 2021-05-07 NOTE — Consult Note (Signed)
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 female admitted medically for 05/01/2021  4:16 PM for ataxia/weakness. 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 Psychiatry was consulted for auditory hallucinations and concern for functional bilateral lower extremity weakness by Dr. Maryland Pink.   On assessment today patient appeared with a no longer hypophonic voice, but a complete whisper. Patient was noted at times (when made to chuckle or when attempting to clarify) that she was able to have some volume in her voice, but patient would quickly return to a whisper. Patient also appeared to have continued functional weakness on assessment as her legs were noted to drop quickly when lifted passively on exam, rather than have downward drift indicating true weakness. Patient was also noted to have some resistance when her RLE was passively lifted. Patient was noted by this writer to also move her legs during assessment (while answering questions) in what appeared to be an unconscious attempt to reposition herself. Patient endorsed confusion but was Aox3 and would directly endorsed being confused only when asked about going home and eventually reported that she would prefer to rest. Interestingly patient did not endorse paranoia today. Patient did continue to endorse AH. 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  As noted yesterday patient has  some behaviors prior to hospitalization that were concerning ie (paranoia). Today patient did not seem as concerned about specific health issues but continues to endorse that her problems are causing significant dysfunction. At this time will attempt to make patient feel that her concerns are being evaluated while also placing boundaries with patient.   Plan  ## Safety and Observation Level:  - Based on my clinical evaluation, I estimate the patient to be at low risk of self harm in the current setting - At this time, we recommend a routine level of observation. This decision is based on my review of the chart including patient's history and current presentation, interview of the patient, mental status examination, and consideration of suicide risk including evaluating suicidal ideation, plan, intent, suicidal or self-harm behaviors, risk factors, and protective factors. This judgment is based on our ability to directly address suicide risk, implement suicide prevention strategies and develop a safety plan while the patient is in the clinical setting. Please contact our team if there is a concern that risk level has changed.     ## Medications:  -- c venlafaxine ER 75 mg -- c risperidone 1 mg BID (new med, no indication to increase today)   ## Medical Decision Making Capacity:  Not formally assessed   ## Further Work-up:  -- serum B12 with MMA, folate - pt on vitamin C supplement and level low yield -- either zinc level or empirically replete 8 mg/d   ## Disposition:  -- per primary team   ## Behavioral / Environmental:  -- Utilize compassion and acknowledge the patient's experiences while setting clear and realistic expectations for care.   ##Legal Status  Thank you for this consult request. Recommendations have been communicated to the primary team.  We will continue to follow at this time.   Total Time spent with patient: 20 minutes  Subjective:   Sheryl Henry is a 65 y.o.  female patient admitted with weakness and ataxia. Has had overall unconvincing workup, multiple markers slightly abnormal. Has 1-2 outstanding labs but thus far nothing explaining degree of weakness.  HPI:  On assessment today patient is Aox3, reporting she is confused as to why she is in the hospital. Patient whispers her answers and reports that her "throat is hurting."Patient reports that her mood today is "not great, but ok." Patinet reports that she is sleeping "ok" and that her appetite has been poor. Patient reports that she continues to have Dalton telling her "trying to get control." Patien endorses that she is hearing multipel voices, both female and female, and that the voices are outside of her head. Patient reports that her AH is not consistnet but she did hear it this AM. Patient denied SI, HI, and VH. Patient reported she was tired of talking but was willing to report that she is confused about if she would go home, when the idea was presented that patient may be able to do therapy at home. Patient endorsed that she was too confused to talk about going home.   Past Medical History:  Past Medical History:  Diagnosis Date  . 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 Diabetes mellitus without complication ([place])   . Hypertension     Past Surgical History:  Procedure Laterality Date  . BACK SURGERY    . BREAST SURGERY    . MANDIBLE SURGERY     Family History: History reviewed. No pertinent family history.  Social History:  Social History   Substance and Sexual Activity  Alcohol Use Not Currently     Social History   Substance and Sexual Activity  Drug Use Never    Social History   Socioeconomic History  . Marital status: Divorced    Spouse name: Not on file  . Number of children: Not on file  . Years of education: Not on file  . Highest education level: Not on file  Occupational History  . Not on file  Tobacco Use  . Smoking status: Never  . Smokeless tobacco: Never  Vaping Use   . Vaping Use: Never used  Substance and Sexual Activity  . Alcohol use: Not Currently  . Drug use: Never  . Sexual activity: Not Currently  Other Topics Concern  . Not on file  Social History Narrative  . Not on file   Social Determinants of Health   Financial Resource Strain: Not on file  Food Insecurity: Not on file  Transportation Needs: Not on file  Physical Activity: Not on file  Stress: Not on file  Social Connections: Not on file   Additional Social History:    Allergies:   Allergies  Allergen Reactions  . Ivp Dye [Iodinated Diagnostic Agents] Swelling    Labs:  Results for orders placed or performed during the hospital encounter of 05/01/21 (from the past 48 hour(s))  Glucose, capillary     Status: Abnormal   Collection Time: 05/05/21 11:31 AM  Result Value Ref Range   Glucose-Capillary 283 (H) 70 - 99 mg/dL    Comment: Glucose reference range applies only to samples taken after fasting for at least 8 hours.   Comment 1 Notify RN    Comment 2 Document  in Chart   Glucose, capillary     Status: Abnormal   Collection Time: 05/05/21  4:10 PM  Result Value Ref Range   Glucose-Capillary 174 (H) 70 - 99 mg/dL    Comment: Glucose reference range applies only to samples taken after fasting for at least 8 hours.   Comment 1 Notify RN    Comment 2 Document in Chart   Glucose, capillary     Status: Abnormal   Collection Time: 05/05/21  9:10 PM  Result Value Ref Range   Glucose-Capillary 170 (H) 70 - 99 mg/dL    Comment: Glucose reference range applies only to samples taken after fasting for at least 8 hours.   Comment 1 Notify RN    Comment 2 Document in Chart   CBC     Status: Abnormal   Collection Time: 05/06/21  1:56 AM  Result Value Ref Range   WBC 16.4 (H) 4.0 - 10.5 K/uL   RBC 5.13 (H) 3.87 - 5.11 MIL/uL   Hemoglobin 15.6 (H) 12.0 - 15.0 g/dL   HCT 70.3 50.0 - 93.8 %   MCV 88.7 80.0 - 100.0 fL   MCH 30.4 26.0 - 34.0 pg   MCHC 34.3 30.0 - 36.0 g/dL    RDW 18.2 99.3 - 71.6 %   Platelets 168 150 - 400 K/uL   nRBC 0.0 0.0 - 0.2 %    Comment: Performed at [facility], [address], [place], [place] [address]  Glucose, capillary     Status: Abnormal   Collection Time: 05/06/21  6:19 AM  Result Value Ref Range   Glucose-Capillary 189 (H) 70 - 99 mg/dL    Comment: Glucose reference range applies only to samples taken after fasting for at least 8 hours.   Comment 1 Notify RN    Comment 2 Document in Chart   Glucose, capillary     Status: Abnormal   Collection Time: 05/06/21 12:38 PM  Result Value Ref Range   Glucose-Capillary 186 (H) 70 - 99 mg/dL    Comment: Glucose reference range applies only to samples taken after fasting for at least 8 hours.  Glucose, capillary     Status: Abnormal   Collection Time: 05/06/21  4:06 PM  Result Value Ref Range   Glucose-Capillary 181 (H) 70 - 99 mg/dL    Comment: Glucose reference range applies only to samples taken after fasting for at least 8 hours.  Glucose, capillary     Status: Abnormal   Collection Time: 05/06/21  9:35 PM  Result Value Ref Range   Glucose-Capillary 131 (H) 70 - 99 mg/dL    Comment: Glucose reference range applies only to samples taken after fasting for at least 8 hours.  CBC     Status: Abnormal   Collection Time: 05/07/21  2:03 AM  Result Value Ref Range   WBC 16.1 (H) 4.0 - 10.5 K/uL   RBC 5.39 (H) 3.87 - 5.11 MIL/uL   Hemoglobin 16.3 (H) 12.0 - 15.0 g/dL   HCT 38.1 (H) 01.7 - 51.0 %   MCV 87.8 80.0 - 100.0 fL   MCH 30.2 26.0 - 34.0 pg   MCHC 34.5 30.0 - 36.0 g/dL   RDW 25.8 52.7 - 78.2 %   Platelets 188 150 - 400 K/uL   nRBC 0.0 0.0 - 0.2 %    Comment: Performed at [facility], [address], [place], [place] [address]  Basic metabolic panel     Status:  Abnormal   Collection Time: 05/07/21  2:03 AM  Result Value Ref Range   Sodium 134 (L) 135 - 145 mmol/L   Potassium 3.8 3.5 - 5.1 mmol/L   Chloride 100 98 - 111 mmol/L   CO2 21 (L) 22 - 32 mmol/L    Glucose, Bld 162 (H) 70 - 99 mg/dL    Comment: Glucose reference range applies only to samples taken after fasting for at least 8 hours.   BUN 32 (H) 8 - 23 mg/dL   Creatinine, Ser 0.77 0.44 - 1.00 mg/dL   Calcium 8.5 (L) 8.9 - 10.3 mg/dL   GFR, Estimated >60 >60 mL/min    Comment: (NOTE) Calculated using the CKD-EPI Creatinine Equation (2021)    Anion gap 13 5 - 15    Comment: Performed at [facility] [address], [place], [place] [address]  Vitamin B12     Status: Abnormal   Collection Time: 05/07/21  2:03 AM  Result Value Ref Range   Vitamin B-12 1,619 (H) 180 - 914 pg/mL    Comment: (NOTE) This assay is not validated for testing neonatal or myeloproliferative syndrome specimens for Vitamin B12 levels. Performed at [facility], [place] [address] [place], [place] [address]   Folate     Status: None   Collection Time: 05/07/21  2:03 AM  Result Value Ref Range   Folate 35.5 >5.9 ng/mL    Comment: Performed at [facility] [address] [place], [place] [address]  Glucose, capillary     Status: Abnormal   Collection Time: 05/07/21  6:59 AM  Result Value Ref Range   Glucose-Capillary 159 (H) 70 - 99 mg/dL    Comment: Glucose reference range applies only to samples taken after fasting for at least 8 hours.    Current Facility-Administered Medications  Medication Dose Route Frequency Provider Last Rate Last Admin  . acetaminophen (TYLENOL) tablet 650 mg  650 mg Oral Q4H PRN Zierle-Ghosh, Asia B, DO   650 mg at 05/06/21 [id number]   Or  . acetaminophen (TYLENOL) 160 MG/5ML solution 650 mg  650 mg Per Tube Q4H PRN Zierle-Ghosh, Asia B, DO       Or  . acetaminophen (TYLENOL) suppository 650 mg  650 mg Rectal Q4H PRN Zierle-Ghosh, Asia B, DO      . dicyclomine (BENTYL) tablet 20 mg  20 mg Oral TID AC & HS Johnson, Clanford L, MD   20 mg at 05/07/21 0851  . heparin injection 5,000 Units  5,000 Units Subcutaneous Q8H Johnson, Clanford L, MD   5,000 Units at  05/07/21 [phone]  . insulin aspart (novoLOG) injection 0-15 Units  0-15 Units Subcutaneous TID WC Zierle-Ghosh, Asia B, DO   3 Units at 05/07/21 0725  . insulin aspart (novoLOG) injection 0-5 Units  0-5 Units Subcutaneous QHS Zierle-Ghosh, Asia B, DO   2 Units at 05/04/21 2218  . insulin aspart (novoLOG) injection 5 Units  5 Units Subcutaneous TID WC Johnson, Clanford L, MD   5 Units at 05/06/21 1630  . insulin glargine-yfgn (SEMGLEE) injection 10 Units  10 Units Subcutaneous Daily Johnson, Clanford L, MD   10 Units at 05/07/21 1020  . levothyroxine (SYNTHROID) tablet 50 mcg  50 mcg Oral Q0600 Murlean Iba, MD   50 mcg at 05/07/21 [phone]  . metoprolol tartrate (LOPRESSOR) tablet 12.5 mg  12.5 mg Oral BID Bonnielee Haff, MD   12.5 mg at 05/07/21 1019  . oxyCODONE-acetaminophen (PERCOCET/ROXICET) 5-325  MG per tablet 1 tablet  1 tablet Oral QID PRN Wynetta Emery, Clanford L, MD   1 tablet at 05/07/21 1020  . phenol (CHLORASEPTIC) mouth spray 1 spray  1 spray Mouth/Throat PRN Bonnielee Haff, MD      . risperiDONE (RISPERDAL M-TABS) disintegrating tablet 1 mg  1 mg Oral BID Johnson, Clanford L, MD   1 mg at 05/07/21 1020  . senna-docusate (Senokot-S) tablet 1 tablet  1 tablet Oral QHS PRN Zierle-Ghosh, Asia B, DO      . venlafaxine XR (EFFEXOR-XR) 24 hr capsule 75 mg  75 mg Oral Daily Johnson, Clanford L, MD   75 mg at 05/07/21 1019      Psychiatric Specialty Exam:  Presentation  General Appearance: Bizarre (laying in bed with hands crossed and eyes closed. Does not open eyes for entire exam)  Eye Contact:None  Speech:-- (whisper, but coherent)  Speech Volume:Decreased  Handedness:No data recorded  Mood and Affect  Mood:-- ("I'm confused.")  Affect:Restricted   Thought Process  Thought Processes:Coherent  Descriptions of Associations:Intact  Orientation:Full (Time, Place and Person)  Thought Content:Logical  History of Schizophrenia/Schizoaffective disorder:No  Duration of  Psychotic Symptoms:Less than six months  Hallucinations:Hallucinations: Auditory Description of Auditory Hallucinations: "voices teling me they want to get control."  Ideas of Reference:None  Suicidal Thoughts:Suicidal Thoughts: No  Homicidal Thoughts:Homicidal Thoughts: No   Sensorium  Memory:Immediate Fair; Recent Fair; Remote Fair  Judgment:Impaired  Insight:None   Executive Functions  Concentration:Fair  Attention Span:Fair  [place] of Knowledge:Good  Language:Fair   Psychomotor Activity  Psychomotor Activity:Psychomotor Activity: Decreased   Assets  Assets:Housing; Social Support   Sleep  Sleep:Sleep: Fair   Physical Exam: Physical Exam HENT:     Head: Normocephalic and atraumatic.  Musculoskeletal:     Comments: Appears to have 3/5 strength in BLE and 3/5 plantarflexion bilaterally  Skin:    General: Skin is dry.  Neurological:     Mental Status: She is alert and oriented to person, place, and time.   Review of Systems  Constitutional:  Positive for malaise/fatigue.  HENT:  Positive for sore throat.   Gastrointestinal:  Positive for abdominal pain.  Psychiatric/Behavioral:  Positive for hallucinations. Negative for suicidal ideas. The patient does not have insomnia.   Blood pressure 136/62, pulse (!) 109, temperature 98.2 F (36.8 C), temperature source Oral, resp. rate 14, height 5\' 7"  (1.702 m), weight 106.9 kg, SpO2 92 %. Body mass index is 36.91 kg/m.  PGY-2  Freida Busman, MD 05/07/2021 10:46 AM

## 2021-05-07 NOTE — TOC Progression Note (Signed)
Transition of Care [facility]) - Progression Note    Patient Details  Name: Sheryl Henry MRN: [id number] Date of Birth: [birth year]  Transition of Care [facility]) CM/SW Contact  Baldemar Lenis, [place] Phone Number: 05/07/2021, 4:17 PM  Clinical Narrative:   CSW spoke with patient earlier today to provide additional bed offers, and again later today to try to get choice for SNF. Patient said she is still thinking and would like more time. CSW updated patient that she is going to be medically ready and we need to know where she's going, and patient nodded understanding. Patient asked for time to discuss with her son. If patient doesn't talk to son tonight, CSW will call him tomorrow for him to assist patient in selecting SNF. CSW to follow.    Expected Discharge Plan: Skilled Nursing Facility Barriers to Discharge: Continued Medical Work up, English as a second language teacher  Expected Discharge Plan and Services Expected Discharge Plan: Skilled Nursing Facility     Post Acute Care Choice: Skilled Nursing Facility Living arrangements for the past 2 months: Single Family Home                                       Social Determinants of Health (SDOH) Interventions    Readmission Risk Interventions No flowsheet data found.

## 2021-05-07 NOTE — Progress Notes (Signed)
NIF -20 VC not working at this time, will try again in morning.

## 2021-05-07 NOTE — Progress Notes (Signed)
Nurse notified by nurse tech that moderate amount of bright red blood was noticed in bed side commode after toileting patient. Vital signs stable. Dayna Barker, MD notified. New orders placed. Care continues.  Melony Overly, RN

## 2021-05-08 DIAGNOSIS — I10 Essential (primary) hypertension: Secondary | ICD-10-CM | POA: Diagnosis not present

## 2021-05-08 DIAGNOSIS — K625 Hemorrhage of anus and rectum: Secondary | ICD-10-CM

## 2021-05-08 LAB — GLUCOSE, CAPILLARY
Glucose-Capillary: 157 mg/dL — ABNORMAL HIGH (ref 70–99)
Glucose-Capillary: 149 mg/dL — ABNORMAL HIGH (ref 70–99)
Glucose-Capillary: 145 mg/dL — ABNORMAL HIGH (ref 70–99)
Glucose-Capillary: 130 mg/dL — ABNORMAL HIGH (ref 70–99)

## 2021-05-08 LAB — CBC
HCT: 46.6 % — ABNORMAL HIGH (ref 36.0–46.0)
Hemoglobin: 15.9 g/dL — ABNORMAL HIGH (ref 12.0–15.0)
MCH: 30.4 pg (ref 26.0–34.0)
MCHC: 34.1 g/dL (ref 30.0–36.0)
MCV: 89.1 fL (ref 80.0–100.0)
Platelets: 203 10*3/uL (ref 150–400)
RBC: 5.23 MIL/uL — ABNORMAL HIGH (ref 3.87–5.11)
RDW: 12.8 % (ref 11.5–15.5)
WBC: 15.9 10*3/uL — ABNORMAL HIGH (ref 4.0–10.5)
nRBC: 0 % (ref 0.0–0.2)

## 2021-05-08 LAB — MULTIPLE MYELOMA PANEL, SERUM
Albumin SerPl Elph-Mcnc: 3.3 g/dL (ref 2.9–4.4)
Albumin/Glob SerPl: 1.2 (ref 0.7–1.7)
Alpha 1: 0.3 g/dL (ref 0.0–0.4)
Alpha2 Glob SerPl Elph-Mcnc: 0.8 g/dL (ref 0.4–1.0)
B-Globulin SerPl Elph-Mcnc: 0.9 g/dL (ref 0.7–1.3)
Gamma Glob SerPl Elph-Mcnc: 0.9 g/dL (ref 0.4–1.8)
Globulin, Total: 3 g/dL (ref 2.2–3.9)
IgA: 346 mg/dL (ref 87–352)
IgG (Immunoglobin G), Serum: 1030 mg/dL (ref 586–1602)
IgM (Immunoglobulin M), Srm: 62 mg/dL (ref 26–217)
Total Protein ELP: 6.3 g/dL (ref 6.0–8.5)

## 2021-05-08 LAB — CK: Total CK: 48 U/L (ref 38–234)

## 2021-05-08 LAB — PROCALCITONIN: Procalcitonin: 0.13 ng/mL

## 2021-05-08 MED ORDER — PANTOPRAZOLE SODIUM 40 MG PO TBEC
40.0000 mg | DELAYED_RELEASE_TABLET | Freq: Two times a day (BID) | ORAL | Status: DC
Start: 2021-05-08 — End: 2021-05-08

## 2021-05-08 MED ORDER — PANTOPRAZOLE SODIUM 40 MG PO TBEC
40.0000 mg | DELAYED_RELEASE_TABLET | Freq: Two times a day (BID) | ORAL | Status: DC
  Administered 2021-05-08 – 2021-05-12 (×9): 40 mg via ORAL
  Filled 2021-05-08 (×9): qty 1

## 2021-05-08 MED ORDER — ZINC SULFATE 220 (50 ZN) MG PO CAPS
220.0000 mg | ORAL_CAPSULE | Freq: Every day | ORAL | Status: DC
  Administered 2021-05-08 – 2021-05-12 (×5): 220 mg via ORAL
  Filled 2021-05-08 (×5): qty 1

## 2021-05-08 MED ORDER — PANTOPRAZOLE SODIUM 40 MG IV SOLR: 40.0000 mg | Freq: Two times a day (BID) | INTRAVENOUS | Status: DC

## 2021-05-08 NOTE — Consult Note (Signed)
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 female admitted medically for 05/01/2021  4:16 PM for ataxia/weakness. 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 Psychiatry was consulted for auditory hallucinations and concern for functional bilateral lower extremity weakness by Dr. Maryland Pink.    Patient's behavior was observed a bit more today as assessment was interrupted by other assessments. Patient was noted to have a marked change in behavior from when Psychiatry was in the room alone then when the RN entered. Patient also appears to exert poor effort during parts of psychiatric assessment but noted to have have sudden improvement during partially  observed GI assessment in ability to answer questions. RN also noted that patient appeared to suddenly be more coherent and able to hold conversation and recall information yesterday afternoon when patient's son came to discuss dispo. Patient's presentation is not typical of delirium as patient's coherency is abrupt and when patient endorses being unable to answer questions she explicitly says, "I'm confused." Patient's poor effort is more noticeable when attempting to talk to patient about improvements and the possibility of discharge.  Today's assessment was more concerning for Factitious disorder with secondary gain being not having to return home. However, this is a dx of exclusion and requires a thorough medical  workup. As patient's primary physical complaint associated with her presentation is reported BLE weakness, patient would require further neurological workup, (ie EMG of peripheral nerves in BLE) to rule out organic illness. It is unlikely that patient's Risperdal is etiology behind reported BLE weakness; however to confirm a CK could be used to rule out medication induced myopathy; however we will not recommend this at this time as patient was perceiving decrease in functionality prior to start of medication. Risperdal at this time appears to have helped with reported AH. Overall differential includes Functional Neurological Disorder vs Factitious disorder .    Plan  ## Safety and Observation Level:  - Based on my clinical evaluation, I estimate the patient to be at low risk of self harm in the current setting - At this time, we recommend a routine level of observation. This decision is based on my review of the chart including patient's history and current presentation, interview of the patient, mental status examination, and consideration of suicide risk including evaluating suicidal ideation, plan, intent, suicidal or self-harm behaviors, risk factors, and protective factors. This judgment is based on our ability to directly address suicide risk, implement suicide prevention strategies and develop a safety plan while the patient is in the clinical setting. Please contact our team if there is a concern that risk level has changed.     ## Medications:  -- c venlafaxine ER 75 mg -- c risperidone 1 mg BID (new med, no indication to increase today)   ## Medical Decision Making Capacity:  Not formally assessed   ## Further Work-up:  -- serum B12 with MMA, folate - pt on vitamin C supplement and level low yield -- either  zinc level or empirically replete 8 mg/d   ## Disposition:  -- per primary team   ## Behavioral / Environmental:  -- Utilize compassion and acknowledge the patient's experiences  while setting clear and realistic expectations for care.   ##Legal Status     Thank you for this consult request. Recommendations have been communicated to the primary team.  We will continue to follow at this time.    Total Time spent with patient: 30 minutes  Subjective:   Sheryl Henry is a 65 y.o. female patient admitted with weakness and ataxia. Has had overall unconvincing workup, multiple markers slightly abnormal. Has 1-2 outstanding labs but thus far nothing explaining degree of weakness.  HPI:   Patient kept her eyes closed most of assessment. On assessment today patient initially presented responding to questions with a hypophonic voice; however when RN entered patient began speaking in whisper to answer questions. Patient initially endorsed that she was confused when asked where she believed she was. Patient was provided with [facility] options and was able to pick hospital as the correct answer, and reported she was in [place]. Patient reported that she needed to think about they year and flicked her fingers back and forth reporting "I'm thinking." Patient reported that it was 2045. Patient reported that she was "confused" and having problems with "my memory." Patient reported she did not recall speaking to her son about dispo yesterday afternoon. Patient reported that she slept well and her mood this AM was "good." Provider and patient discussed where patient thought she should go at discharge, initially patient reported "I'm confused." Provider reframed question so that patient did not have to address the past and patient endorsed belief that a SNF was better than going home, "so I can get help because I am confused." Patient reported "I want to think today" and endorsed that she did not wish to have the TV turned on. Patient endorsed that she would be willing to color or complete worksheets for cognitive stimulation today. Patient initially said, "I don't think so" when asked about SI.  Provider asked patient for a yes or no clarification regarding SI, patient reported "I don't know I don't remember." Patient clarified if patient was having SI at the moment patient reported, "No." Patient denied HI. When assessed for AH patient reported "I don't remember" when assessed for AH at during assessment patient reported "no." Patient denied VH during assessment.   Rn reported that she witnessed patient have a coherent conversation with her son about her plans for dispo yesterday afternoon. RN reports that patient also did not speak in a whisper during this discussion.   Parts of GI assessment were observed and patient was able to answer that she understood that Hgb was correlated with Blood and appeared to follow along with GI assessment.    Past Medical History:  Past Medical History:  Diagnosis Date  . Cutaneous amyloidosis ([place])   . Diabetes mellitus without complication ([place])   . Hypertension     Past Surgical History:  Procedure Laterality Date  . BACK SURGERY    . BREAST SURGERY    . MANDIBLE SURGERY     Family History: History reviewed. No pertinent family history.  Social History:  Social History   Substance and Sexual Activity  Alcohol Use Not Currently     Social History   Substance and Sexual Activity  Drug Use Never    Social History   Socioeconomic History  . Marital status: Divorced  Spouse name: Not on file  . Number of children: Not on file  . Years of education: Not on file  . Highest education level: Not on file  Occupational History  . Not on file  Tobacco Use  . Smoking status: Never  . Smokeless tobacco: Never  Vaping Use  . Vaping Use: Never used  Substance and Sexual Activity  . Alcohol use: Not Currently  . Drug use: Never  . Sexual activity: Not Currently  Other Topics Concern  . Not on file  Social History Narrative  . Not on file   Social Determinants of Health   Financial Resource Strain: Not on file  Food  Insecurity: Not on file  Transportation Needs: Not on file  Physical Activity: Not on file  Stress: Not on file  Social Connections: Not on file   Additional Social History:    Allergies:   Allergies  Allergen Reactions  . Ivp Dye [Iodinated Diagnostic Agents] Swelling    Labs:  Results for orders placed or performed during the hospital encounter of 05/01/21 (from the past 48 hour(s))  Glucose, capillary     Status: Abnormal   Collection Time: 05/06/21 12:38 PM  Result Value Ref Range   Glucose-Capillary 186 (H) 70 - 99 mg/dL    Comment: Glucose reference range applies only to samples taken after fasting for at least 8 hours.  Glucose, capillary     Status: Abnormal   Collection Time: 05/06/21  4:06 PM  Result Value Ref Range   Glucose-Capillary 181 (H) 70 - 99 mg/dL    Comment: Glucose reference range applies only to samples taken after fasting for at least 8 hours.  Glucose, capillary     Status: Abnormal   Collection Time: 05/06/21  9:35 PM  Result Value Ref Range   Glucose-Capillary 131 (H) 70 - 99 mg/dL    Comment: Glucose reference range applies only to samples taken after fasting for at least 8 hours.  CBC     Status: Abnormal   Collection Time: 05/07/21  2:03 AM  Result Value Ref Range   WBC 16.1 (H) 4.0 - 10.5 K/uL   RBC 5.39 (H) 3.87 - 5.11 MIL/uL   Hemoglobin 16.3 (H) 12.0 - 15.0 g/dL   HCT 47.3 (H) 36.0 - 46.0 %   MCV 87.8 80.0 - 100.0 fL   MCH 30.2 26.0 - 34.0 pg   MCHC 34.5 30.0 - 36.0 g/dL   RDW 12.8 11.5 - 15.5 %   Platelets 188 150 - 400 K/uL   nRBC 0.0 0.0 - 0.2 %    Comment: Performed at [facility], [place] [address] [place], [place] [id number]  Basic metabolic panel     Status: Abnormal   Collection Time: 05/07/21  2:03 AM  Result Value Ref Range   Sodium 134 (L) 135 - 145 mmol/L   Potassium 3.8 3.5 - 5.1 mmol/L   Chloride 100 98 - 111 mmol/L   CO2 21 (L) 22 - 32 mmol/L   Glucose, Bld 162 (H) 70 - 99 mg/dL    Comment: Glucose reference  range applies only to samples taken after fasting for at least 8 hours.   BUN 32 (H) 8 - 23 mg/dL   Creatinine, Ser 0.77 0.44 - 1.00 mg/dL   Calcium 8.5 (L) 8.9 - 10.3 mg/dL   GFR, Estimated >60 >60 mL/min    Comment: (NOTE) Calculated using the CKD-EPI Creatinine Equation (2021)    Anion gap 13 5 -  15    Comment: Performed at [facility], [place] [address], [place], [place] [address]  Vitamin B12     Status: Abnormal   Collection Time: 05/07/21  2:03 AM  Result Value Ref Range   Vitamin B-12 1,619 (H) 180 - 914 pg/mL    Comment: (NOTE) This assay is not validated for testing neonatal or myeloproliferative syndrome specimens for Vitamin B12 levels. Performed at [facility], [place] [address] [place], [place] [address]   Folate     Status: None   Collection Time: 05/07/21  2:03 AM  Result Value Ref Range   Folate 35.5 >5.9 ng/mL    Comment: Performed at [place] [address], [place], [place] [address]  Glucose, capillary     Status: Abnormal   Collection Time: 05/07/21  6:59 AM  Result Value Ref Range   Glucose-Capillary 159 (H) 70 - 99 mg/dL    Comment: Glucose reference range applies only to samples taken after fasting for at least 8 hours.  Procalcitonin - Baseline     Status: None   Collection Time: 05/07/21 11:07 AM  Result Value Ref Range   Procalcitonin 0.13 ng/mL    Comment:        Interpretation: PCT (Procalcitonin) <= 0.5 ng/mL: Systemic infection (sepsis) is not likely. Local bacterial infection is possible. (NOTE)       Sepsis PCT Algorithm           Lower Respiratory Tract                                      Infection PCT Algorithm    ----------------------------     ----------------------------         PCT < 0.25 ng/mL                PCT < 0.10 ng/mL          Strongly encourage             Strongly discourage   discontinuation of antibiotics    initiation of antibiotics    ----------------------------      -----------------------------       PCT 0.25 - 0.50 ng/mL            PCT 0.10 - 0.25 ng/mL               OR       >80% decrease in PCT            Discourage initiation of                                            antibiotics      Encourage discontinuation           of antibiotics    ----------------------------     -----------------------------         PCT >= 0.50 ng/mL              PCT 0.26 - 0.50 ng/mL               AND        <80% decrease in PCT             Encourage initiation of  antibiotics       Encourage continuation           of antibiotics    ----------------------------     -----------------------------        PCT >= 0.50 ng/mL                  PCT > 0.50 ng/mL               AND         increase in PCT                  Strongly encourage                                      initiation of antibiotics    Strongly encourage escalation           of antibiotics                                     -----------------------------                                           PCT <= 0.25 ng/mL                                                 OR                                        > 80% decrease in PCT                                      Discontinue / Do not initiate                                             antibiotics  Performed at [facility], [address], [place], [place] [address]   Glucose, capillary     Status: Abnormal   Collection Time: 05/07/21 11:45 AM  Result Value Ref Range   Glucose-Capillary 166 (H) 70 - 99 mg/dL    Comment: Glucose reference range applies only to samples taken after fasting for at least 8 hours.  Glucose, capillary     Status: Abnormal   Collection Time: 05/07/21  4:35 PM  Result Value Ref Range   Glucose-Capillary 163 (H) 70 - 99 mg/dL    Comment: Glucose reference range applies only to samples taken after fasting for at least 8 hours.  Urinalysis, Routine w reflex microscopic Urine,  Clean Catch     Status: Abnormal   Collection Time: 05/07/21  4:43 PM  Result Value Ref Range   Color, Urine AMBER (A) YELLOW    Comment: BIOCHEMICALS MAY BE AFFECTED BY COLOR   APPearance CLOUDY (A) CLEAR   Specific Gravity, Urine 1.026  1.005 - 1.030   pH 5.0 5.0 - 8.0   Glucose, UA NEGATIVE NEGATIVE mg/dL   Hgb urine dipstick SMALL (A) NEGATIVE   Bilirubin Urine NEGATIVE NEGATIVE   Ketones, ur 20 (A) NEGATIVE mg/dL   Protein, ur NEGATIVE NEGATIVE mg/dL   Nitrite NEGATIVE NEGATIVE   Leukocytes,Ua MODERATE (A) NEGATIVE   RBC / HPF 11-20 0 - 5 RBC/hpf   WBC, UA >50 (H) 0 - 5 WBC/hpf   Bacteria, UA MANY (A) NONE SEEN   Squamous Epithelial / LPF 6-10 0 - 5   Mucus PRESENT    Amorphous Crystal PRESENT     Comment: Performed at [facility], [place] [address], [place], [place] [address]  Hemoglobin and hematocrit, blood     Status: Abnormal   Collection Time: 05/07/21  7:04 PM  Result Value Ref Range   Hemoglobin 16.4 (H) 12.0 - 15.0 g/dL   HCT 47.2 (H) 36.0 - 46.0 %    Comment: Performed at [facility] [address], [place], [place] [address]  Type and screen [place]     Status: None   Collection Time: 05/07/21  7:05 PM  Result Value Ref Range   ABO/RH(D) O POS    Antibody Screen NEG    Sample Expiration      05/10/2021,2359 Performed at [facility], [place] [address] [place], [place] [address]   Glucose, capillary     Status: Abnormal   Collection Time: 05/07/21  9:06 PM  Result Value Ref Range   Glucose-Capillary 140 (H) 70 - 99 mg/dL    Comment: Glucose reference range applies only to samples taken after fasting for at least 8 hours.   Comment 1 Notify RN    Comment 2 Document in Chart   Procalcitonin     Status: None   Collection Time: 05/08/21  2:25 AM  Result Value Ref Range   Procalcitonin 0.13 ng/mL    Comment:        Interpretation: PCT (Procalcitonin) <= 0.5 ng/mL: Systemic infection (sepsis) is not likely. Local  bacterial infection is possible. (NOTE)       Sepsis PCT Algorithm           Lower Respiratory Tract                                      Infection PCT Algorithm    ----------------------------     ----------------------------         PCT < 0.25 ng/mL                PCT < 0.10 ng/mL          Strongly encourage             Strongly discourage   discontinuation of antibiotics    initiation of antibiotics    ----------------------------     -----------------------------       PCT 0.25 - 0.50 ng/mL            PCT 0.10 - 0.25 ng/mL               OR       >80% decrease in PCT            Discourage initiation of  antibiotics      Encourage discontinuation           of antibiotics    ----------------------------     -----------------------------         PCT >= 0.50 ng/mL              PCT 0.26 - 0.50 ng/mL               AND        <80% decrease in PCT             Encourage initiation of                                             antibiotics       Encourage continuation           of antibiotics    ----------------------------     -----------------------------        PCT >= 0.50 ng/mL                  PCT > 0.50 ng/mL               AND         increase in PCT                  Strongly encourage                                      initiation of antibiotics    Strongly encourage escalation           of antibiotics                                     -----------------------------                                           PCT <= 0.25 ng/mL                                                 OR                                        > 80% decrease in PCT                                      Discontinue / Do not initiate                                             antibiotics  Performed at [facility], [place] [address] [place], [place] [address]   CBC     Status: Abnormal   Collection Time: 05/08/21  2:25 AM  Result Value Ref Range   WBC 15.9  (H) 4.0 - 10.5 K/uL   RBC 5.23 (H) 3.87 - 5.11 MIL/uL   Hemoglobin 15.9 (H) 12.0 - 15.0 g/dL   HCT 16.146.6 (H) 09.636.0 - 04.546.0 %   MCV 89.1 80.0 - 100.0 fL   MCH 30.4 26.0 - 34.0 pg   MCHC 34.1 30.0 - 36.0 g/dL   RDW 40.912.8 81.111.5 - 91.415.5 %   Platelets 203 150 - 400 K/uL   nRBC 0.0 0.0 - 0.2 %    Comment: Performed at [facility], [address] [place], [place] [address]  CK     Status: None   Collection Time: 05/08/21  2:25 AM  Result Value Ref Range   Total CK 48 38 - 234 U/L    Comment: Performed at [facility], [address] [place], [place] [address]  Glucose, capillary     Status: Abnormal   Collection Time: 05/08/21  6:11 AM  Result Value Ref Range   Glucose-Capillary 157 (H) 70 - 99 mg/dL    Comment: Glucose reference range applies only to samples taken after fasting for at least 8 hours.   Comment 1 Notify RN    Comment 2 Document in Chart     Current Facility-Administered Medications  Medication Dose Route Frequency Provider Last Rate Last Admin  . acetaminophen (TYLENOL) tablet 650 mg  650 mg Oral Q4H PRN Zierle-Ghosh, Asia B, DO   650 mg at 05/06/21 [id number]   Or  . acetaminophen (TYLENOL) 160 MG/5ML solution 650 mg  650 mg Per Tube Q4H PRN Zierle-Ghosh, Asia B, DO       Or  . acetaminophen (TYLENOL) suppository 650 mg  650 mg Rectal Q4H PRN Zierle-Ghosh, Asia B, DO      . cefTRIAXone (ROCEPHIN) 1 g in sodium chloride 0.9 % 100 mL IVPB  1 g Intravenous Q24H Kc, Ramesh, MD 200 mL/hr at 05/07/21 1745 1 g at 05/07/21 1745  . dicyclomine (BENTYL) tablet 20 mg  20 mg Oral TID AC & HS Johnson, Clanford L, MD   20 mg at 05/08/21 0938  . insulin aspart (novoLOG) injection 0-15 Units  0-15 Units Subcutaneous TID WC Zierle-Ghosh, Asia B, DO   3 Units at 05/08/21 [id number]  . insulin aspart (novoLOG) injection 0-5 Units  0-5 Units Subcutaneous QHS Zierle-Ghosh, Asia B, DO   2 Units at 05/04/21 2218  . insulin aspart (novoLOG) injection 5 Units  5 Units Subcutaneous TID WC Johnson,  Clanford L, MD   5 Units at 05/06/21 1630  . insulin glargine-yfgn (SEMGLEE) injection 10 Units  10 Units Subcutaneous Daily Cleora FleetJohnson, Clanford L, MD   10 Units at 05/08/21 [phone]  . levothyroxine (SYNTHROID) tablet 50 mcg  50 mcg Oral Q0600 Cleora FleetJohnson, Clanford L, MD   50 mcg at 05/08/21 [id number]  . metoprolol tartrate (LOPRESSOR) tablet 12.5 mg  12.5 mg Oral BID Osvaldo ShipperKrishnan, Gokul, MD   12.5 mg at 05/08/21 [id number]  . oxyCODONE-acetaminophen (PERCOCET/ROXICET) 5-325 MG per tablet 1 tablet  1 tablet Oral QID PRN Laural BenesJohnson, Clanford L, MD   1 tablet at 05/07/21 1020  . pantoprazole (PROTONIX) injection 40 mg  40 mg Intravenous Q12H Lanae BoastKc, Ramesh, MD   40 mg at 05/08/21 0937  . phenol (CHLORASEPTIC) mouth spray 1 spray  1 spray Mouth/Throat PRN Osvaldo ShipperKrishnan, Gokul, MD      . risperiDONE (RISPERDAL M-TABS) disintegrating tablet 1 mg  1 mg Oral BID Johnson, Clanford  L, MD   1 mg at 05/08/21 0938  . senna-docusate (Senokot-S) tablet 1 tablet  1 tablet Oral QHS PRN Zierle-Ghosh, Asia B, DO      . venlafaxine XR (EFFEXOR-XR) 24 hr capsule 75 mg  75 mg Oral Daily Johnson, Clanford L, MD   75 mg at 05/08/21 6440    Psychiatric Specialty Exam:  Presentation  General Appearance: Appropriate for Environment  Eye Contact:Minimal  Speech:-- (hypophonic to whisper and stayed a whisper, but overall clear)  Speech Volume:Decreased  Handedness:No data recorded  Mood and Affect  Mood:-- ("I'm confused.")  Affect:Restricted   Thought Process  Thought Processes:Goal Directed  Descriptions of Associations:Circumstantial  Orientation:Partial (Aware she is in the hospital, says the year is 2045, aware she is in [place])  Thought Content:Perseveration  History of Schizophrenia/Schizoaffective disorder:No  Duration of Psychotic Symptoms:Less than six months  Hallucinations:Hallucinations: None Description of Auditory Hallucinations: "voices teling me they want to get control."  Ideas of Reference:None  Suicidal  Thoughts:Suicidal Thoughts: No  Homicidal Thoughts:Homicidal Thoughts: No   Sensorium  Memory:Immediate Poor; Recent Poor  Judgment:Fair  Insight:None   Executive Functions  Concentration:Poor  Attention Span:Poor  Recall:Fair  Fund of Knowledge:Fair  Language:Fair   Psychomotor Activity  Psychomotor Activity:Psychomotor Activity: Decreased   Assets  Assets:Housing; Social Support   Sleep  Sleep:Sleep: Fair   Physical Exam: Physical Exam HENT:     Head: Normocephalic and atraumatic.  Pulmonary:     Effort: Pulmonary effort is normal.  Skin:    General: Skin is dry.  Neurological:     Mental Status: She is alert.   Review of Systems  HENT:  Negative for sore throat.   Psychiatric/Behavioral:  Negative for hallucinations and suicidal ideas.   Blood pressure (!) 146/75, pulse (!) 103, temperature 98.2 F (36.8 C), temperature source Oral, resp. rate 14, height 5\' 7"  (1.702 m), weight 106.9 kg, SpO2 92 %. Body mass index is 36.91 kg/m.  PGY-2  , MD 05/08/2021 10:19 AM

## 2021-05-08 NOTE — Progress Notes (Addendum)
Physical Therapy Treatment Patient Details Name: Sheryl Henry MRN: [id number] DOB: [birth year] Today's Date: 05/08/2021   History of Present Illness Sheryl Henry  is a 65 y.o. 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    PT Comments    Pt received in supine and agreeable to therapy session. Pt requires up to +2 modA with heavy cues to safely come sitting EOB, standing to RW, and sitting to BSC/chair. Pt is making functional progress towards goals as demonstrated by performing STS x6 trials this session. Emphasis on safe hand placement and upright posture. Pt c/o dizziness during standing trials and often verbalized fear of falls and would sit without warning. Continue to recommend SNF upon DC. Pt continues to benefit from PT services to progress toward functional mobility goals.        Recommendations for follow up therapy are one component of a multi-disciplinary discharge planning process, led by the attending physician.  Recommendations may be updated based on patient status, additional functional criteria and insurance authorization.  Follow Up Recommendations  Skilled nursing-short term rehab (<3 hours/day)     Assistance Recommended at Discharge Frequent or constant Supervision/Assistance  Equipment Recommendations  None recommended by PT    Recommendations for Other Services       Precautions / Restrictions Precautions Precautions: Fall Restrictions Weight Bearing Restrictions: No     Mobility  Bed Mobility Overal bed mobility: Needs Assistance Bed Mobility: Rolling;Sidelying to Sit;Sit to Supine Rolling: Min assist Sidelying to sit: Mod assist      General bed mobility comments: directional verbal cues for log rolling, tactile cues to reach R hand to L bed rail, modA to elevate trunk at EOB, minA for BLE management     Transfers Overall transfer level: Needs assistance Equipment used: Rolling walker (2 wheels) Transfers: Sit to/from Stand;Bed to chair/wheelchair/BSC Sit to Stand: +2 physical assistance;From elevated surface;Mod assist Stand pivot transfers: Mod assist;+2 physical assistance       General transfer comment: modA +2 physical assist to stand x5 trials, modA +1 physical assist x1 trial, pt requires heavy cues for safe hand placement, power up, and upright posture once standing; therpapist managed turning walker with stand pivot with heavy cues to lift feet when pivoting to bsc    Ambulation/Gait  General Gait Details: small, shuffled, lateral steps during pivot to bsc      Balance Overall balance assessment: Needs assistance Sitting-balance support: Feet supported;No upper extremity supported Sitting balance-Sheryl Henry Scale: Fair Sitting balance - Comments: seated EOB   Standing balance support: Bilateral upper extremity supported;Reliant on assistive device for balance;During functional activity Standing balance-Sheryl Henry Scale: Poor Standing balance comment: dependent on RW or sink for assist       Cognition Arousal/Alertness: Awake/alert Behavior During Therapy: WFL for tasks assessed/performed Overall Cognitive Status: Within Functional Limits for tasks assessed    General Comments: patient verbalized fear of falling      Exercises Other Exercises Other Exercises: Seated: Marching x5, LAQ x5, ankle pumps x10    General Comments General comments (skin integrity, edema, etc.): Pt c/o dizziness with every standing trial, needed cues to reach back for bsc/chair before sitting      Pertinent Vitals/Pain Pain Assessment: No/denies pain     PT Goals (current goals can now be found in the care plan section) Acute Rehab PT Goals Patient Stated Goal: Return home PT Goal Formulation: With patient  Time For Goal Achievement: 05/16/21 Progress towards PT goals: Progressing toward  goals    Frequency    Min 3X/week      PT Plan Current plan remains appropriate    Co-evaluation PT/OT/SLP Co-Evaluation/Treatment: Yes Reason for Co-Treatment: To address functional/ADL transfers;Complexity of the patient's impairments (multi-system involvement) PT goals addressed during session: Mobility/safety with mobility;Balance;Proper use of DME;Strengthening/ROM OT goals addressed during session: ADL's and self-care      AM-PAC PT "6 Clicks" Mobility   Outcome Measure  Help needed turning from your back to your side while in a flat bed without using bedrails?: A Little Help needed moving from lying on your back to sitting on the side of a flat bed without using bedrails?: A Lot Help needed moving to and from a bed to a chair (including a wheelchair)?: A Lot Help needed standing up from a chair using your arms (e.g., wheelchair or bedside chair)?: A Lot Help needed to walk in hospital room?: Total Help needed climbing 3-5 steps with a railing? : Total 6 Click Score: 11    End of Session Equipment Utilized During Treatment: Gait belt Activity Tolerance: Patient limited by fatigue Patient left: with call bell/phone within reach;in chair;with chair alarm set Nurse Communication: Mobility status (use stedy to get pt back) PT Visit Diagnosis: Unsteadiness on feet (R26.81);Other abnormalities of gait and mobility (R26.89);Muscle weakness (generalized) (M62.81);Difficulty in walking, not elsewhere classified (R26.2);Pain     Time: 1206-1242 PT Time Calculation (min) (ACUTE ONLY): 36 min  Charges:  $Therapeutic Activity: 8-22 mins                     Harland German, Student PTA CI: Carly P., PTA  Carly M Poff 05/08/2021, 1:40 PM

## 2021-05-08 NOTE — Plan of Care (Signed)
°  Problem: Clinical Measurements: °Goal: Cardiovascular complication will be avoided °Outcome: Progressing °  °Problem: Activity: °Goal: Risk for activity intolerance will decrease °Outcome: Progressing °  °Problem: Coping: °Goal: Level of anxiety will decrease °Outcome: Progressing °  °

## 2021-05-08 NOTE — Progress Notes (Signed)
Pt put forth very good effort and technique with both breathing function tests  NIF = -25 VC = 1.35L

## 2021-05-08 NOTE — Progress Notes (Signed)
PROGRESS NOTE    Sheryl Henry  ZHG:[id number] DOB: January 20, 1956 DOA: 05/01/2021 PCP: [name], NP   Chief Complaint  Patient presents with   Hallucinations    Auditory hallucination started after halloween.  "Felt sound-wave in head about one hour away.  Pt thinks neighbors are watching her with cameras in her house.     Brief Narrative/Hospital Course:  Sheryl Henry, 65 y.o. female with PMH of  T2DM, HTN presented to the ED with ringing in her head-1 day prior to presentation, also thinking that her neighbors were watching her with cameras in her house.She reports she heard a high-volume sound in her head and felt like it made her whole head vibrate.  After that she had barely any sleep all night.  Symptoms seem to resolve and then the day of presentation it happened again.  Around lunchtime she had an intense feeling in her head.  She does not characterize it as pain, but cannot describe the sensation.  She laid down to take a nap at 2:00.  When she woke up she had the high-volume sound in her head again.  She reports that it happened so fast she cannot describe the characteristics of it.  He goes "through her head" but she cannot describe where it starts and finishes.  She says that the feeling that she has after that is like her brain needs a repeat.  It is an intense feeling and she does not know how to make it go away.  She knows for sure the soundwave brings it on.  The feeling that her brain needs to remove it is in both of her temples.  She again says that its not the pain.  Per patient, this soundwave is her main concern. Son had told the ER that she has had this before and she has been off some of her psych medications.  They were planning to discharge her home with follow-up to psychiatry, patient then announced that she has not been able to walk all day. She reports she feels like she is staggering.  She does not feel dizzy or like the room is spinning.   Her LE weakness  worsened after receiving the flu and covid vaccines recently. Patient was admitted. Seen by psychiatry, neurology-recommend MRI cervical spine-that was negative for acute significant findings.  Subjective: Seen this morning.  Patient is speaking with muffled voice/whispering at times able to speak clearly.  She had episode of rectal bleeding yesterday.  Patient informs me she does not remember No new complaints.  Assessment & Plan:  LE weakness abrupt onset:MRI cervical spine no acute significant finding.  Seen by neurology.  Had a lumbar puncture, elevated protein level could be nonspecific.  Less concern about GBS.  Differentials could include some degree of worsening due to recent use of Risperdal?, deconditioning also has neuropathy.  Check CK level continue PT OT appreciate neurology and psychiatry input.    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  Well-controlled on Premeal insulin, Glargine 10 units and SSI.  Monitor CBG Recent Labs  Lab 05/07/21 0659 05/07/21 1145 05/07/21 1635 05/07/21 2106 05/08/21 0611  GLUCAP 159* 166* 163* 140* 157*   Rectal bleeding episode 11/16 evening: Discontinued heparin, placed on PPI, will ask GI to take a look.  Check serial H&H-and monitor.  AL amyloidosis cutaneous: Followed by [facility]. Elnoria Howard at [facility] not on systemic treatment previously on thalidomide Revlimid melphalan, was discussed with Dr. Arbie Cookey recommended ordering multiple  myeloma panel along with light chains,-multiple myeloma panel pending, kappa/lambda light chain ratio 1.1 normal although kappa free light chain elevated at one 27.3, patient will need outpatient follow-up.  Acute hypoxia chest x-ray with atelectasis no chest pain or shortness of breath.  Mildly abnormal D-dimer no lower extremity swelling chest pain low suspicion for PTE.  Weaned off oxygen continue I-S and ambulation  Leukocytosis/mild thrombocytopenia: Patient is afebrile-Given worsening  WBC count repeated UA and chest x-ray.  UA concerning for UTI placed on empiric antibiotics. Patient is incontinent. Recent Labs  Lab 05/06/21 0156 05/07/21 0203 05/07/21 1904 05/08/21 0225  HGB 15.6* 16.3* 16.4* 15.9*  HCT 45.5 47.3* 47.2* 46.6*  WBC 16.4* 16.1*  --  15.9*  PLT 168 188  --  203    Hypothyroidism continue levothyroxine  Migraine headache some headache this morning, S/P migraine cocktail on 11/11.  Continue with symptomatic management.  Psych issues/auditary hallucination anxiety/depression on Risperdal venlafaxine, seen by psychiatry input appreciated currently low risk. Plan is for skilled nursing facility  Hypertension: Controlled on once metoprolol..  Class II Obesity:Patient's Body mass index is 36.91 kg/m. : Will benefit with PCP follow-up, weight loss  healthy lifestyle and outpatient sleep evaluation.  DVT prophylaxis: Place and maintain sequential compression device Start: 05/03/21 0714 SCD's Start: 05/02/21 0238 Code Status:   Code Status: Full Code Family Communication: plan of care discussed with patient at bedside. Status is: Inpatient Remains inpatient appropriate because: For ongoing management of bilateral lower extremity weakness, need for SNF Disposition: Currently is not medically stable for discharge. Anticipated Disposition: SNF once medically optimized  Objective: Vitals last 24 hrs: Vitals:   05/07/21 2046 05/07/21 2347 05/08/21 0416 05/08/21 0736  BP: 133/64 133/73 (!) 127/55 (!) 146/75  Pulse: (!) 106 93 100 (!) 103  Resp: 18 20 18 14   Temp: 97.8 F (36.6 C) 98.7 F (37.1 C) 98.1 F (36.7 C) 98.2 F (36.8 C)  TempSrc: Oral  Oral Oral  SpO2: 94% 100% 92% 92%  Weight:      Height:       Weight change:   Intake/Output Summary (Last 24 hours) at 05/08/2021 1021 Last data filed at 05/07/2021 1745 Gross per 24 hour  Intake 0 ml  Output --  Net 0 ml   Net IO Since Admission: 513.6 mL [05/08/21 1021]   Physical  Examination: General exam: Alert awake, not in distress.  Whispering.  HEENT:Oral mucosa moist, Ear/Nose WNL grossly, dentition normal. Respiratory system: bilaterally diminished,o use of accessory muscle Cardiovascular system: S1 & S2 +, No JVD,. Gastrointestinal system: Abdomen soft, mildly tender, NT,ND, BS+ Nervous System:Alert, awake, moving extremities and grossly nonfocal Extremities: no edema, distal peripheral pulses palpable.  Skin: No rashes,no icterus. MSK: Normal muscle bulk,tone, power   Medications reviewed: Scheduled Meds:  dicyclomine  20 mg Oral TID AC & HS   insulin aspart  0-15 Units Subcutaneous TID WC   insulin aspart  0-5 Units Subcutaneous QHS   insulin aspart  5 Units Subcutaneous TID WC   insulin glargine-yfgn  10 Units Subcutaneous Daily   levothyroxine  50 mcg Oral Q0600   metoprolol tartrate  12.5 mg Oral BID   pantoprazole (PROTONIX) IV  40 mg Intravenous Q12H   risperiDONE  1 mg Oral BID   venlafaxine XR  75 mg Oral Daily   Continuous Infusions:  cefTRIAXone (ROCEPHIN)  IV 1 g (05/07/21 1745)    Diet Order  Diet heart healthy/carb modified Room service appropriate? Yes; Fluid consistency: Thin  Diet effective now                  Weight change:   Wt Readings from Last 3 Encounters:  05/02/21 106.9 kg     Consultants:see note  Procedures:see note Antimicrobials: Anti-infectives (From admission, onward)    Start     Dose/Rate Route Frequency Ordered Stop   05/07/21 1800  cefTRIAXone (ROCEPHIN) 1 g in sodium chloride 0.9 % 100 mL IVPB        1 g 200 mL/hr over 30 Minutes Intravenous Every 24 hours 05/07/21 1722        Culture/Microbiology No results found for: SDES, SPECREQUEST, CULT, REPTSTATUS  Other culture-see note  Unresulted Labs (From admission, onward)     Start     Ordered   05/08/21 0500  Procalcitonin  Daily,   R     Question:  Specimen collection method  Answer:  Lab=Lab collect   05/07/21 1043    05/07/21 1801  Hemoglobin and hematocrit, blood  Now then every 8 hours,   R (with TIMED occurrences)     Question:  Specimen collection method  Answer:  Lab=Lab collect   05/07/21 1801   05/04/21 0932  Multiple Myeloma Panel (SPEP&IFE w/QIG)  Once,   R       Question:  Specimen collection method  Answer:  Lab=Lab collect   05/04/21 0932   05/02/21 1730  Draw extra clot tube  (Oligoclonal Bands, CSF + Serum panel)  Once,   R        05/02/21 1729          Data Reviewed: I have personally reviewed following labs and imaging studies CBC: Recent Labs  Lab 05/01/21 1722 05/05/21 0301 05/06/21 0156 05/07/21 0203 05/07/21 1904 05/08/21 0225  WBC 8.4 19.0* 16.4* 16.1*  --  15.9*  NEUTROABS 5.8  --   --   --   --   --   HGB 16.6* 15.1* 15.6* 16.3* 16.4* 15.9*  HCT 46.0 43.3 45.5 47.3* 47.2* 46.6*  MCV 88.3 87.8 88.7 87.8  --  89.1  PLT 150 117* 168 188  --  885   Basic Metabolic Panel: Recent Labs  Lab 05/01/21 1722 05/04/21 1214 05/05/21 0301 05/07/21 0203  NA 139 137 137 134*  K 4.0 4.4 4.1 3.8  CL 102 106 105 100  CO2 28 24 25  21*  GLUCOSE 206* 208* 149* 162*  BUN 15 22 23  32*  CREATININE 0.63 0.94 0.79 0.77  CALCIUM 8.8* 8.3* 8.4* 8.5*  MG 2.1 2.4 2.4  --    GFR: Estimated Creatinine Clearance: 89.4 mL/min (by C-G formula based on SCr of 0.77 mg/dL). Liver Function Tests: Recent Labs  Lab 05/01/21 1722 05/05/21 0301  AST 34 30  ALT 28 27  ALKPHOS 84 76  BILITOT 0.8 0.7  PROT 7.9 6.3*  ALBUMIN 4.2 2.8*   No results for input(s): LIPASE, AMYLASE in the last 168 hours. No results for input(s): AMMONIA in the last 168 hours. Coagulation Profile: No results for input(s): INR, PROTIME in the last 168 hours. Cardiac Enzymes: Recent Labs  Lab 05/08/21 0225  CKTOTAL 48   BNP (last 3 results) No results for input(s): PROBNP in the last 8760 hours. HbA1C: No results for input(s): HGBA1C in the last 72 hours. CBG: Recent Labs  Lab 05/07/21 0659  05/07/21 1145 05/07/21 1635 05/07/21 2106 05/08/21 0611  GLUCAP 159* 166*  163* 140* 157*   Lipid Profile: No results for input(s): CHOL, HDL, LDLCALC, TRIG, CHOLHDL, LDLDIRECT in the last 72 hours. Thyroid Function Tests: No results for input(s): TSH, T4TOTAL, FREET4, T3FREE, THYROIDAB in the last 72 hours. Anemia Panel: Recent Labs    05/07/21 0203  VITAMINB12 1,619*  FOLATE 35.5   Sepsis Labs: Recent Labs  Lab 05/07/21 1107 05/08/21 0225  PROCALCITON 0.13 0.13    Recent Results (from the past 240 hour(s))  Resp Panel by RT-PCR (Flu A&B, Covid) Urine, Clean Catch     Status: None   Collection Time: 05/02/21 12:06 AM   Specimen: Urine, Clean Catch; Nasopharyngeal(NP) swabs in vial transport medium  Result Value Ref Range Status   SARS Coronavirus 2 by RT PCR NEGATIVE NEGATIVE Final    Comment: (NOTE) SARS-CoV-2 target nucleic acids are NOT DETECTED.  The SARS-CoV-2 RNA is generally detectable in upper respiratory specimens during the acute phase of infection. The lowest concentration of SARS-CoV-2 viral copies this assay can detect is 138 copies/mL. A negative result does not preclude SARS-Cov-2 infection and should not be used as the sole basis for treatment or other patient management decisions. A negative result may occur with  improper specimen collection/handling, submission of specimen other than nasopharyngeal swab, presence of viral mutation(s) within the areas targeted by this assay, and inadequate number of viral copies(<138 copies/mL). A negative result must be combined with clinical observations, patient history, and epidemiological information. The expected result is Negative.  Fact Sheet for Patients:  EntrepreneurPulse.com.au  Fact Sheet for Healthcare Providers:  IncredibleEmployment.be  This test is no t yet approved or cleared by the [place] FDA and  has been authorized for detection and/or diagnosis of  SARS-CoV-2 by FDA under an Emergency Use Authorization (EUA). This EUA will remain  in effect (meaning this test can be used) for the duration of the COVID-19 declaration under Section 564(b)(1) of the Act, 21 U.S.C.section 360bbb-3(b)(1), unless the authorization is terminated  or revoked sooner.       Influenza A by PCR NEGATIVE NEGATIVE Final   Influenza B by PCR NEGATIVE NEGATIVE Final    Comment: (NOTE) The Xpert Xpress SARS-CoV-2/FLU/RSV plus assay is intended as an aid in the diagnosis of influenza from Nasopharyngeal swab specimens and should not be used as a sole basis for treatment. Nasal washings and aspirates are unacceptable for Xpert Xpress SARS-CoV-2/FLU/RSV testing.  Fact Sheet for Patients: EntrepreneurPulse.com.au  Fact Sheet for Healthcare Providers: IncredibleEmployment.be  This test is not yet approved or cleared by the [place] FDA and has been authorized for detection and/or diagnosis of SARS-CoV-2 by FDA under an Emergency Use Authorization (EUA). This EUA will remain in effect (meaning this test can be used) for the duration of the COVID-19 declaration under Section 564(b)(1) of the Act, 21 U.S.C. section 360bbb-3(b)(1), unless the authorization is terminated or revoked.  Performed at [facility], [address], [place], [place] [address]   MRSA Next Gen by PCR, Nasal     Status: None   Collection Time: 05/02/21  9:11 PM   Specimen: Nasal Mucosa; Nasal Swab  Result Value Ref Range Status   MRSA by PCR Next Gen NOT DETECTED NOT DETECTED Final    Comment: (NOTE) The GeneXpert MRSA Assay (FDA approved for NASAL specimens only), is one component of a comprehensive MRSA colonization surveillance program. It is not intended to diagnose MRSA infection nor to guide or monitor treatment for MRSA infections. Test performance is not FDA approved in  patients less than 5 years old. Performed at [facility], [address] [place], [place] [address]      Radiology Studies: DG Chest 1 View  Result Date: 05/07/2021 CLINICAL DATA:  Leukocytosis EXAM: CHEST  1 VIEW COMPARISON:  Chest radiograph 05/04/2021 FINDINGS: The cardiomediastinal silhouette is stable. Lung volumes are low. Linear opacities in the lung bases are favored to reflect subsegmental atelectasis. The upper lungs are well aerated. There is no pleural effusion. There is no pneumothorax. The bones are stable. IMPRESSION: Low lung volumes with linear opacities in the lung bases favored to reflect atelectasis. Electronically Signed   By: Valetta Mole M.D.   On: 05/07/2021 09:58     LOS: 3 days   Antonieta Pert, MD [facility]  05/08/2021, 10:21 AM

## 2021-05-08 NOTE — Progress Notes (Signed)
Occupational Therapy Treatment Patient Details Name: Sheryl Henry MRN: [id number] DOB: 1955-11-15 Today's Date: 05/08/2021   History of present illness Sheryl Henry  is a 65 y.o. 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   OT comments  Patient is making good progress with OT treatment. Patient was co-treat with PT for patient and therapist safety. Patient required assistance of one to get to EOB and assistance of 2 for sit to stands, transfers, and all standing tasks. Patient performed toilet transfer to [facility] and required assistance for clothing management, hygiene, and balance. Patient stood at sink to perform light grooming and mention that she was afraid of falling. Acute OT to continue to follow.    Recommendations for follow up therapy are one component of a multi-disciplinary discharge planning process, led by the attending physician.  Recommendations may be updated based on patient status, additional functional criteria and insurance authorization.    Follow Up Recommendations  Skilled nursing-short term rehab (<3 hours/day)    Assistance Recommended at Discharge Frequent or constant Supervision/Assistance  Equipment Recommendations  BSC/3in1    Recommendations for Other Services      Precautions / Restrictions Precautions Precautions: Fall Restrictions Weight Bearing Restrictions: No       Mobility Bed Mobility Overal bed mobility: Needs Assistance Bed Mobility: Rolling;Sidelying to Sit;Sit to Supine Rolling: Min assist Sidelying to sit: Mod assist       General bed mobility comments: directional verbal cues for log rolling, tactile cues to reach R hand to L bed rail, modA to elevate trunk at EOB, minA for BLE management    Transfers Overall transfer level: Needs assistance Equipment used: Rolling walker (2 wheels) Transfers:  Sit to/from Stand;Bed to chair/wheelchair/BSC Sit to Stand: +2 physical assistance;From elevated surface;Mod assist Stand pivot transfers: Mod assist;+2 physical assistance         General transfer comment: modA +2 physical assist to stand x5 trials, modA +1 physical assist x1 trial, pt requires heavy cues for safe hand placement, power up, and upright posture once standing; therpapist managed turning walker with stand pivot with heavy cues to lift feet when pivoting to bsc     Balance Overall balance assessment: Needs assistance Sitting-balance support: Feet supported;No upper extremity supported Sitting balance-Leahy Scale: Fair Sitting balance - Comments: seated EOB   Standing balance support: Bilateral upper extremity supported;Reliant on assistive device for balance;During functional activity Standing balance-Leahy Scale: Poor Standing balance comment: dependent on RW or sink for assist                           ADL either performed or assessed with clinical judgement   ADL Overall ADL's : Needs assistance/impaired     Grooming: Wash/dry face;Moderate assistance;Standing Grooming Details (indicate cue type and reason): mod assist for balance     Lower Body Bathing: Moderate assistance;Maximal assistance;Sitting/lateral leans;Sit to/from stand Lower Body Bathing Details (indicate cue type and reason): patient was able to wash thigh and calf of BLEs and required assistance for feet seated and bottom when standing     Lower Body Dressing: Maximal assistance;Moderate assistance;+2 for physical assistance Lower Body Dressing Details (indicate cue type and reason): max assist to doff and donn socks; mod/max assist of 2 for standing LB dressing Toilet Transfer: Moderate assistance;+2 for physical assistance Toilet Transfer Details (indicate cue type and reason): mod assist x2  with RW to transfer from EOB to [facility] Toileting- Clothing Manipulation and Hygiene: Maximal  assistance;+2 for physical assistance;Sit to/from stand Toileting - Clothing Manipulation Details (indicate cue type and reason): +2 for standing and rear peri area       General ADL Comments: patient requires assitance of 2 for all standing self care tasks    Extremity/Trunk Assessment              Vision       Perception     Praxis      Cognition Arousal/Alertness: Awake/alert Behavior During Therapy: WFL for tasks assessed/performed Overall Cognitive Status: Within Functional Limits for tasks assessed                                 General Comments: patient verbalized fear of falling          Exercises Exercises: Other exercises Other Exercises Other Exercises: Seated: Marching x5, LAQ x5, ankle pumps x10   Shoulder Instructions       General Comments Pt c/o dizziness with every standing trial, needed cues to reach back for bsc/chair before sitting    Pertinent Vitals/ Pain       Pain Assessment: No/denies pain  Home Living                                          Prior Functioning/Environment              Frequency  Min 2X/week        Progress Toward Goals  OT Goals(current goals can now be found in the care plan section)  Progress towards OT goals: Progressing toward goals  Acute Rehab OT Goals Patient Stated Goal: none stated OT Goal Formulation: With patient Time For Goal Achievement: 05/19/21 Potential to Achieve Goals: Fair ADL Goals Pt Will Perform Grooming: with min guard assist;standing;with adaptive equipment Pt Will Perform Lower Body Bathing: with modified independence;sitting/lateral leans;with adaptive equipment Pt Will Perform Lower Body Dressing: with modified independence;with adaptive equipment;sitting/lateral leans Pt Will Transfer to Toilet: with min guard assist;stand pivot transfer Pt/caregiver will Perform Home Exercise Program: Increased strength;Both right and left upper  extremity;With Supervision  Plan Discharge plan remains appropriate    Co-evaluation    PT/OT/SLP Co-Evaluation/Treatment: Yes Reason for Co-Treatment: To address functional/ADL transfers;Complexity of the patient's impairments (multi-system involvement) PT goals addressed during session: Mobility/safety with mobility;Balance;Proper use of DME;Strengthening/ROM OT goals addressed during session: ADL's and self-care      AM-PAC OT "6 Clicks" Daily Activity     Outcome Measure   Help from another person eating meals?: None Help from another person taking care of personal grooming?: A Little Help from another person toileting, which includes using toliet, bedpan, or urinal?: A Lot Help from another person bathing (including washing, rinsing, drying)?: A Lot Help from another person to put on and taking off regular upper body clothing?: A Little Help from another person to put on and taking off regular lower body clothing?: A Lot 6 Click Score: 16    End of Session Equipment Utilized During Treatment: Rolling walker (2 wheels);Gait belt  OT Visit Diagnosis: Unsteadiness on feet (R26.81);Other abnormalities of gait and mobility (R26.89);Muscle weakness (generalized) (M62.81);Ataxia, unspecified (R27.0)   Activity Tolerance Patient tolerated treatment well   Patient Left in chair;with call bell/phone within reach;with  chair alarm set   Nurse Communication Mobility status;Need for lift equipment        Time: 1206-1242 OT Time Calculation (min): 36 min  Charges: OT General Charges $OT Visit: 1 Visit OT Treatments $Self Care/Home Management : 8-22 mins  Alfonse Flavors, OTA Acute Rehabilitation Services  Pager [phone] Office [phone]   Dewain Penning 05/08/2021, 1:40 PM

## 2021-05-08 NOTE — Progress Notes (Signed)
Pt has great effort with event.  NIF= -30 cmH2O VC= 1.61L

## 2021-05-08 NOTE — Consult Note (Addendum)
Consultation  Referring Provider:  Dr. Antonieta Pert    Primary Care Physician:  [name], NP Primary Gastroenterologist: Althia Forts        Reason for Consultation:  Rectal Bleed            HPI:   Sheryl Henry is a 65 y.o. female with a past medical history of diabetes, hypertension and others listed below, who presented to the ER with a chief complaint of "ringing in her head".  She was admitted for possible CVA, but so far work-up is negative, MRI cervical spine with no acute finding, seen by neurology and had a lumbar puncture with elevated protein level which could be nonspecific, they are not planning further work-up.  We are called now in regards to any rectal bleeding since admission.    05/07/2016 patient was found to have a moderate amount of bright red blood in the bedside commode after toileting.  Hemoglobin that morning 16.3--> 15.9 overnight.    This morning, patient is very hard to garner history from.  She seems completely unaware of any bleeding that she experienced.  When asked about abdominal pain she says "not now, but I am sure I have before".  Tells me that her rectum does not hurt.  In general she whispers and answer my questions and just nods her head or gives one word answers.  Per nursing staff she has not had any further episodes of bleeding.    Denies fever, chills or current abdominal pain.  GI history: Unknown, patient unable to answer if she is ever seen a gastroenterologist or had colonoscopy before  Past Medical History:  Diagnosis Date   Cutaneous amyloidosis ([place])    Diabetes mellitus without complication ([place])    Hypertension     Past Surgical History:  Procedure Laterality Date   BACK SURGERY     BREAST SURGERY     MANDIBLE SURGERY      History reviewed. No pertinent family history.   Social History   Tobacco Use   Smoking status: Never   Smokeless tobacco: Never  Vaping Use   Vaping Use: Never used  Substance Use Topics    Alcohol use: Not Currently   Drug use: Never    Prior to Admission medications   Medication Sig Start Date End Date Taking? Authorizing Provider  cetirizine (ZYRTEC) 10 MG tablet Take 1 tablet by mouth daily. 01/23/15  Yes [provider]  CINNAMON PO Take 1 capsule by mouth in the morning and at bedtime.   Yes [provider]  Coenzyme Q10 (COQ10 PO) Take 1 tablet by mouth daily.   Yes [provider]  CRANBERRY PO Take 1 tablet by mouth in the morning and at bedtime.   Yes [provider]  dicyclomine (BENTYL) 20 MG tablet Take 20 mg by mouth 4 (four) times daily. 03/13/21  Yes [provider]  Insulin Glargine (BASAGLAR KWIKPEN) 100 UNIT/ML Inject 20 Units into the skin daily. 11/09/19  Yes [provider]  levothyroxine (SYNTHROID) 50 MCG tablet Take 50 mcg by mouth daily. 03/13/21  Yes [provider]  naproxen sodium (ALEVE) 220 MG tablet Take 220 mg by mouth daily as needed (PAIN).   Yes [provider]  NON FORMULARY "Circulation and Vein support" MVI            Hesperidin  100 mg Vitamin C 30 mg  Rutin  20 mg Flavonoids 720 mg                                 Grape seed extract   40 mg Diosmin  600 mg                                    Pine Bark Extract   40 mg Take 1 capsule by mouth every morning Butcher Broom extract 40 mg   Yes [provider]  Omega-3 1000 MG CAPS Take 1 capsule by mouth in the morning and at bedtime. 01/23/15  Yes [provider]  oxyCODONE-acetaminophen (PERCOCET/ROXICET) 5-325 MG tablet Take 1 tablet by mouth 4 (four) times daily as needed for moderate pain. 04/11/21  Yes [provider]  perindopril (ACEON) 4 MG tablet Take 4 mg by mouth daily. 03/13/21  Yes [provider]  venlafaxine XR (EFFEXOR-XR) 75 MG 24 hr capsule Take 75 mg by mouth daily. 03/13/21  Yes [provider]    Current Facility-Administered  Medications  Medication Dose Route Frequency Provider Last Rate Last Admin   acetaminophen (TYLENOL) tablet 650 mg  650 mg Oral Q4H PRN Zierle-Ghosh, Asia B, DO   650 mg at 05/06/21 0621   Or   acetaminophen (TYLENOL) 160 MG/5ML solution 650 mg  650 mg Per Tube Q4H PRN Zierle-Ghosh, Asia B, DO       Or   acetaminophen (TYLENOL) suppository 650 mg  650 mg Rectal Q4H PRN Zierle-Ghosh, Asia B, DO       cefTRIAXone (ROCEPHIN) 1 g in sodium chloride 0.9 % 100 mL IVPB  1 g Intravenous Q24H Kc, Ramesh, MD 200 mL/hr at 05/07/21 1745 1 g at 05/07/21 1745   dicyclomine (BENTYL) tablet 20 mg  20 mg Oral TID AC & HS Johnson, Clanford L, MD   20 mg at 05/07/21 2235   insulin aspart (novoLOG) injection 0-15 Units  0-15 Units Subcutaneous TID WC Zierle-Ghosh, Asia B, DO   3 Units at 05/08/21 5035   insulin aspart (novoLOG) injection 0-5 Units  0-5 Units Subcutaneous QHS Zierle-Ghosh, Asia B, DO   2 Units at 05/04/21 2218   insulin aspart (novoLOG) injection 5 Units  5 Units Subcutaneous TID WC Johnson, Clanford L, MD   5 Units at 05/06/21 1630   insulin glargine-yfgn (SEMGLEE) injection 10 Units  10 Units Subcutaneous Daily Johnson, Clanford L, MD   10 Units at 05/07/21 1020   levothyroxine (SYNTHROID) tablet 50 mcg  50 mcg Oral Q0600 Wynetta Emery, Clanford L, MD   50 mcg at 05/08/21 0628   metoprolol tartrate (LOPRESSOR) tablet 12.5 mg  12.5 mg Oral BID Bonnielee Haff, MD   12.5 mg at 05/07/21 2235   oxyCODONE-acetaminophen (PERCOCET/ROXICET) 5-325 MG per tablet 1 tablet  1 tablet Oral QID PRN Wynetta Emery, Clanford L, MD   1 tablet at 05/07/21 1020   pantoprazole (PROTONIX) injection 40 mg  40 mg Intravenous Q12H Kc, Maren [place], MD   40 mg at 05/07/21 2235   phenol (CHLORASEPTIC) mouth spray 1 spray  1 spray Mouth/Throat PRN Bonnielee Haff, MD       risperiDONE (RISPERDAL M-TABS) disintegrating tablet 1 mg  1 mg Oral BID Johnson, Clanford L, MD   1 mg at 05/07/21 2235   senna-docusate (Senokot-S) tablet 1 tablet  1 tablet  Oral QHS PRN Zierle-Ghosh,  Asia B, DO       venlafaxine XR (EFFEXOR-XR) 24 hr capsule 75 mg  75 mg Oral Daily Johnson, Clanford L, MD   75 mg at 05/07/21 1019    Allergies as of 05/01/2021   (Not on File)     Review of Systems:    Constitutional: No weight loss, fever or chills Skin: No rash Cardiovascular: No chest pain Respiratory: No SOB  Gastrointestinal: See HPI and otherwise negative Genitourinary: No dysuria  Neurological: No headache Musculoskeletal: No new muscle or joint pain Hematologic: No bruising Psychiatric: No history of depression or anxiety    Physical Exam:  Vital signs in last 24 hours: Temp:  [97.8 F (36.6 C)-98.7 F (37.1 C)] 98.2 F (36.8 C) (11/17 0736) Pulse Rate:  [93-106] 103 (11/17 0736) Resp:  [14-20] 14 (11/17 0736) BP: (127-146)/(55-77) 146/75 (11/17 0736) SpO2:  [92 %-100 %] 92 % (11/17 0736) Last BM Date: 05/06/21 General:   Caucasian female appears to be in NAD, Well developed, Well nourished, alert and somewhat uncooperative Head:  Normocephalic and atraumatic. Eyes:   PEERL, EOMI. No icterus. Conjunctiva pink. Ears:  Normal auditory acuity. Neck:  Supple Throat: Oral cavity and pharynx without inflammation, swelling or lesion. Teeth in good condition. Lungs: Respirations even and unlabored. Lungs clear to auscultation bilaterally.   No wheezes, crackles, or rhonchi.  Heart: Normal S1, S2. No MRG. Regular rate and rhythm. No peripheral edema, cyanosis or pallor.  Abdomen:  Soft, nondistended, nontender. No rebound or guarding. Normal bowel sounds. No appreciable masses or hepatomegaly. Rectal: External: Hemorrhoid tags; internal: Decreased sphincter tone, no mass, no TTP, light brown residue on the glove Msk:  Symmetrical without gross deformities. Peripheral pulses intact.  Extremities:  Without edema, no deformity or joint abnormality. Normal ROM, normal sensation. Neurologic:  Alert and  oriented x4;  grossly normal  neurologically. Skin:   Dry and intact without significant lesions or rashes. Psychiatric: Uses one-word answers, whispers  LAB RESULTS: Recent Labs    05/06/21 0156 05/07/21 0203 05/07/21 1904 05/08/21 0225  WBC 16.4* 16.1*  --  15.9*  HGB 15.6* 16.3* 16.4* 15.9*  HCT 45.5 47.3* 47.2* 46.6*  PLT 168 188  --  203   BMET Recent Labs    05/07/21 0203  NA 134*  K 3.8  CL 100  CO2 21*  GLUCOSE 162*  BUN 32*  CREATININE 0.77  CALCIUM 8.5*   STUDIES: DG Chest 1 View  Result Date: 05/07/2021 CLINICAL DATA:  Leukocytosis EXAM: CHEST  1 VIEW COMPARISON:  Chest radiograph 05/04/2021 FINDINGS: The cardiomediastinal silhouette is stable. Lung volumes are low. Linear opacities in the lung bases are favored to reflect subsegmental atelectasis. The upper lungs are well aerated. There is no pleural effusion. There is no pneumothorax. The bones are stable. IMPRESSION: Low lung volumes with linear opacities in the lung bases favored to reflect atelectasis. Electronically Signed   By: Valetta Mole M.D.   On: 05/07/2021 09:58     Impression / Plan:   Impression: 1.  Rectal bleeding: 1 episode of bright red blood in the commode on 11/16 PM, none since, rectal exam unrevealing today, hemoglobin stable; consider hemorrhoidal bleeding 2.  Lower extremity weakness with abrupt onset: MRI cervical spine no acute finding, being evaluated by neurology 3.  Diabetes type 2 with neurological complications/neuropathy 4.  AL amyloidosis cutaneous: Followed by Dr. Elnoria Howard at [facility], multiple myeloma panel pending 5.  Acute hypoxia: Chest x-ray with atelectasis, no chest pain or shortness  of breath, mildly abnormal D-dimer with low suspicion for PTE, weaned off oxygen 6.  Leukocytosis/mild thrombocytopenia: Being treated for a UTI 7.  Psych issues/auditory hallucinations/anxiety/depression: Psych consulted  Plan: 1.  It is unclear after time of my interview whether or not patient has ever had a colonoscopy  before.  She does not seem to have any further acute GI bleeding, rectal exam unrevealing, hemoglobin stable.  With all of her other neurological issues likely does not need an emergent endoscopic procedure at this time.  If she has further bleeding or hemoglobin starts to drop then please let us know.  At some point she will need a screening colonoscopy but likely this can be accomplished outpatient. 2.  For now continue to monitor hemoglobin with transfusion as needed less than 7 3.  Monitor for further bleeding.  We will likely sign off.  Thank you for your kind consultation.  Lavone Nian [facility]  05/08/2021, 9:07 AM   ________________________________________________________________________  Velora Heckler GI MD note:  I personally examined the patient, reviewed the data and agree with the assessment and plan described above.  She had a single episode of minor rectal bleeding. Examination today underwhelming.  Hb is normal.  As long as this does not recur, I think it is safe for outpatient evaluation with a colonoscopy.    Please call or page with any further questions or concerns.    Owens Loffler, MD [facility] Gastroenterology Pager [phone]

## 2021-05-09 LAB — GLUCOSE, CAPILLARY
Glucose-Capillary: 136 mg/dL — ABNORMAL HIGH (ref 70–99)
Glucose-Capillary: 128 mg/dL — ABNORMAL HIGH (ref 70–99)
Glucose-Capillary: 111 mg/dL — ABNORMAL HIGH (ref 70–99)
Glucose-Capillary: 116 mg/dL — ABNORMAL HIGH (ref 70–99)

## 2021-05-09 LAB — CBC
HCT: 45.1 % (ref 36.0–46.0)
Hemoglobin: 15.6 g/dL — ABNORMAL HIGH (ref 12.0–15.0)
MCH: 30.9 pg (ref 26.0–34.0)
MCHC: 34.6 g/dL (ref 30.0–36.0)
MCV: 89.3 fL (ref 80.0–100.0)
Platelets: 211 10*3/uL (ref 150–400)
RBC: 5.05 MIL/uL (ref 3.87–5.11)
RDW: 12.7 % (ref 11.5–15.5)
WBC: 15.2 10*3/uL — ABNORMAL HIGH (ref 4.0–10.5)
nRBC: 0 % (ref 0.0–0.2)

## 2021-05-09 LAB — PROCALCITONIN: Procalcitonin: 0.16 ng/mL

## 2021-05-09 MED ORDER — VENLAFAXINE HCL ER 75 MG PO CP24
112.5000 mg | ORAL_CAPSULE | Freq: Every day | ORAL | Status: DC
  Administered 2021-05-10 – 2021-05-12 (×3): 112.5 mg via ORAL
  Filled 2021-05-09 (×3): qty 1

## 2021-05-09 NOTE — Care Management Important Message (Signed)
Important Message  Patient Details  Name: Sheryl Henry MRN: [id number] Date of Birth: 02-10-1956   Medicare Important Message Given:  Yes     Dorena Bodo 05/09/2021, 2:15 PM

## 2021-05-09 NOTE — TOC Progression Note (Signed)
Transition of Care [facility]) - Progression Note    Patient Details  Name: Sheryl Henry MRN: [id number] Date of Birth: June 30, 1955  Transition of Care [facility]) CM/SW Contact  Baldemar Lenis, [place] Phone Number: 05/09/2021, 3:25 PM  Clinical Narrative:   CSW spoke with [facility], insurance is still pending at this time. CSW to follow.    Expected Discharge Plan: Skilled Nursing Facility Barriers to Discharge: Continued Medical Work up, English as a second language teacher  Expected Discharge Plan and Services Expected Discharge Plan: Skilled Nursing Facility     Post Acute Care Choice: Skilled Nursing Facility Living arrangements for the past 2 months: Single Family Home                                       Social Determinants of Health (SDOH) Interventions    Readmission Risk Interventions No flowsheet data found.

## 2021-05-09 NOTE — Progress Notes (Signed)
Physical Therapy Treatment Patient Details Name: Sheryl Henry MRN: [id number] DOB: 1956-01-16 Today's Date: 05/09/2021   History of Present Illness Sheryl Henry  is a 65 y.o. 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    PT Comments    The pt was able to make good progress with OOB mobility and standing balance at this time. She continues to benefit from RW for BUE support, but was able to progress from modA to power up to minA with increased verbal cues for positioning. The pt was also able to take small lateral steps along the EOB in both directions, but was limited by pain in LLE that inhibited her from wt bearing on her LLE at this time. Will continue to benefit from skilled PT to progress activity tolerance and OOB mobility.    Recommendations for follow up therapy are one component of a multi-disciplinary discharge planning process, led by the attending physician.  Recommendations may be updated based on patient status, additional functional criteria and insurance authorization.  Follow Up Recommendations  Skilled nursing-short term rehab (<3 hours/day)     Assistance Recommended at Discharge Frequent or constant Supervision/Assistance  Equipment Recommendations  None recommended by PT    Recommendations for Other Services       Precautions / Restrictions Precautions Precautions: Fall Restrictions Weight Bearing Restrictions: No     Mobility  Bed Mobility Overal bed mobility: Needs Assistance Bed Mobility: Supine to Sit;Sit to Supine     Supine to sit: Min guard Sit to supine: Min guard   General bed mobility comments: pt able to complete movements without assist, increased cues at times    Transfers Overall transfer level: Needs assistance Equipment used: Rolling walker (2 wheels) Transfers: Sit to/from Stand Sit to Stand:  Mod assist           General transfer comment: modA to power up at times, but improved to minA with reps. completed x8 through session. cues for placement of LLE underneath her. pt with limited wt bearing on LLE    Ambulation/Gait Ambulation/Gait assistance: Min assist;Mod assist Gait Distance (Feet): 5 Feet Assistive device: Rolling walker (2 wheels) Gait Pattern/deviations: Step-to pattern;Decreased weight shift to left Gait velocity: decreased Gait velocity interpretation: <1.31 ft/sec, indicative of household ambulator   General Gait Details: pt unable to bear wt on LLE due to pain and unable to hop without use of LLE. able to take small lateral steps/wiggles with RLE first then LLE to move laterally along EOB. single posterior LOB with moda to recover      Balance Overall balance assessment: Needs assistance Sitting-balance support: Feet supported;No upper extremity supported Sitting balance-Leahy Scale: Fair Sitting balance - Comments: seated EOB   Standing balance support: Bilateral upper extremity supported;Reliant on assistive device for balance;During functional activity Standing balance-Leahy Scale: Poor Standing balance comment: dependent on RW or sink for assist                            Cognition Arousal/Alertness: Awake/alert Behavior During Therapy: WFL for tasks assessed/performed Overall Cognitive Status: Within Functional Limits for tasks assessed                                 General Comments: pt slow to respond to some questions, but generally following  cues when given increased cues and time.        Exercises Other Exercises Other Exercises: sit-stand from EOB x6    General Comments General comments (skin integrity, edema, etc.): VSS on RA      Pertinent Vitals/Pain Pain Assessment: Faces Faces Pain Scale: Hurts little more Pain Location: L foot/ankle, pt refuses to wt bear on LLE Pain Descriptors / Indicators:  Discomfort;Grimacing;Guarding Pain Intervention(s): Limited activity within patient's tolerance;Monitored during session;Repositioned     PT Goals (current goals can now be found in the care plan section) Acute Rehab PT Goals Patient Stated Goal: Return home PT Goal Formulation: With patient Time For Goal Achievement: 05/16/21 Potential to Achieve Goals: Good Progress towards PT goals: Progressing toward goals    Frequency    Min 3X/week      PT Plan Current plan remains appropriate       AM-PAC PT "6 Clicks" Mobility   Outcome Measure  Help needed turning from your back to your side while in a flat bed without using bedrails?: A Little Help needed moving from lying on your back to sitting on the side of a flat bed without using bedrails?: A Lot Help needed moving to and from a bed to a chair (including a wheelchair)?: A Lot Help needed standing up from a chair using your arms (e.g., wheelchair or bedside chair)?: A Lot Help needed to walk in hospital room?: Total Help needed climbing 3-5 steps with a railing? : Total 6 Click Score: 11    End of Session Equipment Utilized During Treatment: Gait belt Activity Tolerance: Patient limited by fatigue Patient left: in bed;with call bell/phone within reach;with bed alarm set;with family/visitor present Nurse Communication: Mobility status PT Visit Diagnosis: Unsteadiness on feet (R26.81);Other abnormalities of gait and mobility (R26.89);Muscle weakness (generalized) (M62.81);Difficulty in walking, not elsewhere classified (R26.2);Pain Pain - Right/Left: Left Pain - part of body: Ankle and joints of foot     Time: 1758-1830 PT Time Calculation (min) (ACUTE ONLY): 32 min  Charges:  $Gait Training: 8-22 mins $Therapeutic Exercise: 8-22 mins                     Vickki Muff, PT, DPT   Acute Rehabilitation Department Pager #: [phone]   Ronnie Derby 05/09/2021, 7:00 PM

## 2021-05-09 NOTE — TOC Progression Note (Signed)
Transition of Care [facility]) - Progression Note    Patient Details  Name: Sheryl Henry MRN: [id number] Date of Birth: [birth year]  Transition of Care [facility]) CM/SW Contact  Baldemar Lenis, [place] Phone Number: 05/09/2021, 10:04 AM  Clinical Narrative:   CSW spoke with patient's son over the phone to discuss bed offers. Son asked about [facility] and [facility]. CSW reached out to both, but neither are able to offer a bed. CSW updated son and he chose [facility]. Son spoke with patient, patient in agreement. CSW contacted [facility] to ask them to [employer] authorization request. CSW to follow.    Expected Discharge Plan: Skilled Nursing Facility Barriers to Discharge: Continued Medical Work up, English as a second language teacher  Expected Discharge Plan and Services Expected Discharge Plan: Skilled Nursing Facility     Post Acute Care Choice: Skilled Nursing Facility Living arrangements for the past 2 months: Single Family Home                                       Social Determinants of Health (SDOH) Interventions    Readmission Risk Interventions No flowsheet data found.

## 2021-05-09 NOTE — Progress Notes (Signed)
PROGRESS NOTE    Sheryl Henry  HUT:[id number] DOB: Jul 12, 1955 DOA: 05/01/2021 PCP: [name], NP   Chief Complaint  Patient presents with   Hallucinations    Auditory hallucination started after halloween.  "Felt sound-wave in head about one hour away.  Pt thinks neighbors are watching her with cameras in her house.     Brief Narrative/Hospital Course:  Sheryl Henry, 65 y.o. female with PMH of  T2DM, HTN presented to the ED with ringing in her head-1 day prior to presentation, also thinking that her neighbors were watching her with cameras in her house.She reports she heard a high-volume sound in her head and felt like it made her whole head vibrate.  After that she had barely any sleep all night.  Symptoms seem to resolve and then the day of presentation it happened again.  Around lunchtime she had an intense feeling in her head.  She does not characterize it as pain, but cannot describe the sensation.  She laid down to take a nap at 2:00.  When she woke up she had the high-volume sound in her head again.  She reports that it happened so fast she cannot describe the characteristics of it.  He goes "through her head" but she cannot describe where it starts and finishes.  She says that the feeling that she has after that is like her brain needs a repeat.  It is an intense feeling and she does not know how to make it go away.  She knows for sure the soundwave brings it on.  The feeling that her brain needs to remove it is in both of her temples.  She again says that its not the pain.  Per patient, this soundwave is her main concern. Son had told the ER that she has had this before and she has been off some of her psych medications.  They were planning to discharge her home with follow-up to psychiatry, patient then announced that she has not been able to walk all day. She reports she feels like she is staggering.  She does not feel dizzy or like the room is spinning.   Her LE weakness  worsened after receiving the flu and covid vaccines recently. Patient was admitted.Seen by psychiatry, neurology-recommend MRI cervical spine-that was negative for acute significant findings. She had episode of rectal bleeding- seen by GI-since no recurrence of rectal bleeding noted holding off on EGD and colonoscopy at this time will need outpatient screening colonoscopy. Seen by PT OT and has advised skilled nursing facility.  Subjective: No fever overnight Blood pressure stable WBC at 15k She is alert awake, able to verbalize well, denies any complaints,able to lift bilateral lower extremities well.  Assessment & Plan:  LE weakness abrupt onset:MRI cervical spine no acute significant finding.  Seen by neurology.  Had a lumbar puncture, elevated protein level likely nonspecific.  Less concern about GBS given sudden onset of symptoms and intact reflex. Differentials could include some degree of worsening due to recent use of Risperdal?, deconditioning also has neuropathy.  Nl CK level.  Suspect mostly deconditioning related, will benefit with skilled nursing facility continue PT OT. Appreciate neurology and psychiatry input.    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  Well-controlled on Glargine 10 units and SSI.  Refusing Premeal insulin.Monitor CBG Recent Labs  Lab 05/08/21 0611 05/08/21 1133 05/08/21 1656 05/08/21 2105 05/09/21 0609  GLUCAP 157* 149* 145* 130* 136*    Rectal bleeding  episode 11/16 evening: Discontinued heparin, placed on PPI-seen by GI-since no recurrence of rectal bleeding noted holding off on EGD and colonoscopy at this time will need outpatient screening colonoscopy.  AL amyloidosis cutaneous: Followed by Dr. Elnoria Howard at [facility] not on systemic treatment previously on thalidomide Revlimid melphalan, was discussed with Dr. Arbie Cookey recommended ordering multiple myeloma panel along with light chains,-multiple myeloma panel pending,  kappa/lambda light chain ratio 1.1 normal although kappa free light chain elevated at one 27.3, patient will need outpatient follow-up.  Acute hypoxia chest x-ray with atelectasis no chest pain or shortness of breath.  Mildly abnormal D-dimer no lower extremity swelling chest pain low suspicion for PTE.  Weaned off oxygen, continue I-S and ambulation  UTI- with leukocytosis- patient is afebrile-Given worsening WBC count repeated UA and chest x-ray.  UA concerning for UTI placed on empiric antibiotics. Patient is incontinent. Recent Labs  Lab 05/07/21 0203 05/07/21 1904 05/08/21 0225 05/09/21 0307  HGB 16.3* 16.4* 15.9* 15.6*  HCT 47.3* 47.2* 46.6* 45.1  WBC 16.1*  --  15.9* 15.2*  PLT 188  --  203 211     Hypothyroidism continue levothyroxine  Migraine headache some headache this morning, S/P migraine cocktail on 11/11.  Continue with symptomatic management.  Psych issues/auditary hallucination anxiety/depression on Risperdal venlafaxine, seen by psychiatry input appreciated currently low risk. Plan is for skilled nursing facility  Hypertension: Controlled on once metoprolol..  Class II Obesity:Patient's Body mass index is 36.91 kg/m. : Will benefit with PCP follow-up, weight loss  healthy lifestyle and outpatient sleep evaluation.  DVT prophylaxis: Place and maintain sequential compression device Start: 05/03/21 0714 SCD's Start: 05/02/21 0238 Code Status:   Code Status: Full Code Family Communication: plan of care discussed with patient at bedside. Status is: Inpatient Remains inpatient appropriate because: For ongoing management of bilateral lower extremity weakness, need for SNF Disposition: SNF once available. Medically stable.  Objective: Vitals last 24 hrs: Vitals:   05/08/21 1932 05/08/21 2249 05/08/21 2318 05/09/21 0341  BP: (!) 155/91 (!) 148/73 (!) 147/65 (!) 127/51  Pulse: (!) 108  98 92  Resp: 17  19 18   Temp: (!) 97.5 F (36.4 C)  97.9 F (36.6 C) 98.4 F  (36.9 C)  TempSrc:   Oral Oral  SpO2: 94%  97% 95%  Weight:      Height:       Weight change:   Intake/Output Summary (Last 24 hours) at 05/09/2021 6283 Last data filed at 05/09/2021 0600 Gross per 24 hour  Intake 360 ml  Output 150 ml  Net 210 ml    Net IO Since Admission: 723.6 mL [05/09/21 0707]   Physical Examination: General exam: AAOx 3, pleasant, NAD HEENT:Oral mucosa moist, Ear/Nose WNL grossly, dentition normal. Respiratory system: bilaterally clear, no use of accessory muscle Cardiovascular system: S1 & S2 +, No JVD,. Gastrointestinal system: Abdomen soft,NT,ND, BS+ Nervous System:Alert, awake, moving extremities and grossly nonfocal Extremities: No edema, distal peripheral pulses palpable.  Skin: No rashes,no icterus. MSK: Normal muscle bulk,tone, power   Medications reviewed: Scheduled Meds:  dicyclomine  20 mg Oral TID AC & HS   insulin aspart  0-15 Units Subcutaneous TID WC   insulin aspart  0-5 Units Subcutaneous QHS   insulin aspart  5 Units Subcutaneous TID WC   insulin glargine-yfgn  10 Units Subcutaneous Daily   levothyroxine  50 mcg Oral Q0600   metoprolol tartrate  12.5 mg Oral BID   pantoprazole  40 mg Oral BID   risperiDONE  1 mg Oral BID   venlafaxine XR  75 mg Oral Daily   zinc sulfate  220 mg Oral Daily   Continuous Infusions:  cefTRIAXone (ROCEPHIN)  IV 1 g (05/08/21 1750)    Diet Order             Diet heart healthy/carb modified Room service appropriate? Yes; Fluid consistency: Thin  Diet effective now                  Weight change:   Wt Readings from Last 3 Encounters:  05/02/21 106.9 kg     Consultants:see note  Procedures:see note Antimicrobials: Anti-infectives (From admission, onward)    Start     Dose/Rate Route Frequency Ordered Stop   05/07/21 1800  cefTRIAXone (ROCEPHIN) 1 g in sodium chloride 0.9 % 100 mL IVPB        1 g 200 mL/hr over 30 Minutes Intravenous Every 24 hours 05/07/21 1722         Culture/Microbiology No results found for: SDES, SPECREQUEST, CULT, REPTSTATUS  Other culture-see note  Unresulted Labs (From admission, onward)    None     Data Reviewed: I have personally reviewed following labs and imaging studies CBC: Recent Labs  Lab 05/05/21 0301 05/06/21 0156 05/07/21 0203 05/07/21 1904 05/08/21 0225 05/09/21 0307  WBC 19.0* 16.4* 16.1*  --  15.9* 15.2*  HGB 15.1* 15.6* 16.3* 16.4* 15.9* 15.6*  HCT 43.3 45.5 47.3* 47.2* 46.6* 45.1  MCV 87.8 88.7 87.8  --  89.1 89.3  PLT 117* 168 188  --  203 992    Basic Metabolic Panel: Recent Labs  Lab 05/04/21 1214 05/05/21 0301 05/07/21 0203  NA 137 137 134*  K 4.4 4.1 3.8  CL 106 105 100  CO2 24 25 21*  GLUCOSE 208* 149* 162*  BUN 22 23 32*  CREATININE 0.94 0.79 0.77  CALCIUM 8.3* 8.4* 8.5*  MG 2.4 2.4  --     GFR: Estimated Creatinine Clearance: 89.4 mL/min (by C-G formula based on SCr of 0.77 mg/dL). Liver Function Tests: Recent Labs  Lab 05/05/21 0301  AST 30  ALT 27  ALKPHOS 76  BILITOT 0.7  PROT 6.3*  ALBUMIN 2.8*    No results for input(s): LIPASE, AMYLASE in the last 168 hours. No results for input(s): AMMONIA in the last 168 hours. Coagulation Profile: No results for input(s): INR, PROTIME in the last 168 hours. Cardiac Enzymes: Recent Labs  Lab 05/08/21 0225  CKTOTAL 48    BNP (last 3 results) No results for input(s): PROBNP in the last 8760 hours. HbA1C: No results for input(s): HGBA1C in the last 72 hours. CBG: Recent Labs  Lab 05/08/21 0611 05/08/21 1133 05/08/21 1656 05/08/21 2105 05/09/21 0609  GLUCAP 157* 149* 145* 130* 136*    Lipid Profile: No results for input(s): CHOL, HDL, LDLCALC, TRIG, CHOLHDL, LDLDIRECT in the last 72 hours. Thyroid Function Tests: No results for input(s): TSH, T4TOTAL, FREET4, T3FREE, THYROIDAB in the last 72 hours. Anemia Panel: Recent Labs    05/07/21 0203  VITAMINB12 1,619*  FOLATE 35.5    Sepsis Labs: Recent  Labs  Lab 05/07/21 1107 05/08/21 0225 05/09/21 0307  PROCALCITON 0.13 0.13 0.16     Recent Results (from the past 240 hour(s))  Resp Panel by RT-PCR (Flu A&B, Covid) Urine, Clean Catch     Status: None   Collection Time: 05/02/21 12:06 AM   Specimen: Urine, Clean Catch; Nasopharyngeal(NP) swabs in vial transport medium  Result Value  Ref Range Status   SARS Coronavirus 2 by RT PCR NEGATIVE NEGATIVE Final    Comment: (NOTE) SARS-CoV-2 target nucleic acids are NOT DETECTED.  The SARS-CoV-2 RNA is generally detectable in upper respiratory specimens during the acute phase of infection. The lowest concentration of SARS-CoV-2 viral copies this assay can detect is 138 copies/mL. A negative result does not preclude SARS-Cov-2 infection and should not be used as the sole basis for treatment or other patient management decisions. A negative result may occur with  improper specimen collection/handling, submission of specimen other than nasopharyngeal swab, presence of viral mutation(s) within the areas targeted by this assay, and inadequate number of viral copies(<138 copies/mL). A negative result must be combined with clinical observations, patient history, and epidemiological information. The expected result is Negative.  Fact Sheet for Patients:  EntrepreneurPulse.com.au  Fact Sheet for Healthcare Providers:  IncredibleEmployment.be  This test is no t yet approved or cleared by the [place] FDA and  has been authorized for detection and/or diagnosis of SARS-CoV-2 by FDA under an Emergency Use Authorization (EUA). This EUA will remain  in effect (meaning this test can be used) for the duration of the COVID-19 declaration under Section 564(b)(1) of the Act, 21 U.S.C.section 360bbb-3(b)(1), unless the authorization is terminated  or revoked sooner.       Influenza A by PCR NEGATIVE NEGATIVE Final   Influenza B by PCR NEGATIVE NEGATIVE Final     Comment: (NOTE) The Xpert Xpress SARS-CoV-2/FLU/RSV plus assay is intended as an aid in the diagnosis of influenza from Nasopharyngeal swab specimens and should not be used as a sole basis for treatment. Nasal washings and aspirates are unacceptable for Xpert Xpress SARS-CoV-2/FLU/RSV testing.  Fact Sheet for Patients: EntrepreneurPulse.com.au  Fact Sheet for Healthcare Providers: IncredibleEmployment.be  This test is not yet approved or cleared by the [place] FDA and has been authorized for detection and/or diagnosis of SARS-CoV-2 by FDA under an Emergency Use Authorization (EUA). This EUA will remain in effect (meaning this test can be used) for the duration of the COVID-19 declaration under Section 564(b)(1) of the Act, 21 U.S.C. section 360bbb-3(b)(1), unless the authorization is terminated or revoked.  Performed at [facility], [address] [place], [place] [address]   MRSA Next Gen by PCR, Nasal     Status: None   Collection Time: 05/02/21  9:11 PM   Specimen: Nasal Mucosa; Nasal Swab  Result Value Ref Range Status   MRSA by PCR Next Gen NOT DETECTED NOT DETECTED Final    Comment: (NOTE) The GeneXpert MRSA Assay (FDA approved for NASAL specimens only), is one component of a comprehensive MRSA colonization surveillance program. It is not intended to diagnose MRSA infection nor to guide or monitor treatment for MRSA infections. Test performance is not FDA approved in patients less than 65 years old. Performed at [facility], [address], [place], [place] [address]       Radiology Studies: DG Chest 1 View  Result Date: 05/07/2021 CLINICAL DATA:  Leukocytosis EXAM: CHEST  1 VIEW COMPARISON:  Chest radiograph 05/04/2021 FINDINGS: The cardiomediastinal silhouette is stable. Lung volumes are low. Linear opacities in the lung bases are favored to reflect subsegmental atelectasis. The upper lungs are well aerated. There is no  pleural effusion. There is no pneumothorax. The bones are stable. IMPRESSION: Low lung volumes with linear opacities in the lung bases favored to reflect atelectasis. Electronically Signed   By: Valetta Mole M.D.   On: 05/07/2021 09:58  LOS: 4 days   Antonieta Pert, MD [facility]  05/09/2021, 7:07 AM

## 2021-05-09 NOTE — Progress Notes (Signed)
With good effort patient did:  NIF = -24 VC = 1.22L

## 2021-05-09 NOTE — Consult Note (Signed)
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 female admitted medically for 05/01/2021  4:16 PM for ataxia/weakness. 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 Psychiatry was consulted for auditory hallucinations and concern for functional bilateral lower extremity weakness by Dr. Maryland Pink.     Her current presentation of chronic and lifelong feelings of paranoia with (+) sx of hallucinations limited to periods of medical illness and/or steroid use is consistent with likely paranoid personality disorder; a psychotic spectrum disorder such as schizophrenia remains on the differential but is overall less likely given the timeline per son and pt. She had been on venlafaxine ER 75 mg as an outpt and has been on and off of risperidone 1 mg BID for the past couple of years.  She was compliant with venlafaxine prior to admission as evidenced by pt report. On initial examination, patient is hypophonic and difficult to understand. She was alert, oriented, and attentive, performing DOWB with no issue. She was appropriately worried about her increasing weakness.    Today, patient remained hypophonic but volume increased from yesterday; no change in volume when this was commented on. Alert and oriented, possibly some attention deficit (when asked to count backwards from 42-->27 petered off in early 30s after asking where she was supposed to stop. Endorses significant  anxiety about health decline, amenable to mild inc in venlafaxine. No other care team members entered/exited room during assessment. Does not remember discussion about SNF, but would prefer to go to one over going home.   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 However, this is a dx of exclusion and requires a thorough medical workup. As patient's primary physical complaint associated with her presentation is reported BLE weakness, patient would require further neurological workup, (ie EMG of peripheral nerves in BLE) to rule out organic illness. It is unlikely that patient's Risperdal is etiology behind reported BLE weakness; however to confirm a CK could be used to rule out medication induced myopathy. We will not recommend this at this time as patient was perceiving decrease in functionality prior to start of medication. Risperdal at this time appears to have helped with reported AH and overall organizaton. Overall differential includes Functional Neurological Disorder vs Factitious disorder (vs as-yet undiagnosed organic d/o).    Plan  ## Safety and Observation Level:  - Based on my clinical evaluation, I estimate the patient to be at low risk of self harm in the current setting - At this time, we recommend a routine level of observation. This decision is based on my review of the chart including patient's history and current presentation, interview of the patient, mental status examination, and consideration of suicide risk including evaluating suicidal ideation, plan, intent, suicidal or self-harm behaviors, risk factors, and protective factors. This judgment is based on our ability to directly address suicide risk, implement suicide prevention strategies and develop a safety plan  while the patient is in the clinical setting. Please contact our team if there is a concern that risk  level has changed.     ## Medications:  -- i venlafaxine ER 112.5 mg -- c risperidone 1 mg BID (new med, no indication to increase in near future)   ## Medical Decision Making Capacity:  Not formally assessed   ## Further Work-up:  -- serum B12 with MMA, folate - pt on vitamin C supplement and level low yield -- either zinc level or empirically replete    ## Disposition:  -- per primary team   ## Behavioral / Environmental:  -- Utilize compassion and acknowledge the patient's experiences while setting clear and realistic expectations for care.   ##Legal Status     Thank you for this consult request. Recommendations have been communicated to the primary team.  We will sign off at this time; pt is medically stable and has been accepted to at least 1 SNF. Please reconsult if pt here in 1-2 weeks or if there is any decline/new questions for psych team.    Total Time spent with patient: 15 minutes  Subjective:   Sheryl Henry is a 65 y.o. female patient admitted with weakness and ataxia. Has had overall unconvincing workup, multiple markers slightly abnormal.   HPI:   Patient opened her eyes throughout assessment. On assessment today patient presented responding to questions with a hypophonic voice with minimal/no variations in volume. Patient was oriented to location, situation, month and year; thought next holiday was Christmas. Some difficulty with attention testing (42-->31 and then could not remember end point of 28). More engaged with exam than previously. Still does not remember talking with son about dispo, but would want to go to a SNF to regain strength. NO SI, HI, some vague AH (no details able to be elicited, not command in nature).  Past Medical History:  Past Medical History:  Diagnosis Date  . Cutaneous amyloidosis (HCC)   . Diabetes mellitus without complication (HCC)   . Hypertension     Past Surgical History:  Procedure Laterality Date  . BACK SURGERY    .  BREAST SURGERY    . MANDIBLE SURGERY     Family History: History reviewed. No pertinent family history.  Social History:  Social History   Substance and Sexual Activity  Alcohol Use Not Currently     Social History   Substance and Sexual Activity  Drug Use Never    Social History   Socioeconomic History  . Marital status: Divorced    Spouse name: Not on file  . Number of children: Not on file  . Years of education: Not on file  . Highest education level: Not on file  Occupational History  . Not on file  Tobacco Use  . Smoking status: Never  . Smokeless tobacco: Never  Vaping Use  . Vaping Use: Never used  Substance and Sexual Activity  . Alcohol use: Not Currently  . Drug use: Never  . Sexual activity: Not Currently  Other Topics Concern  . Not on file  Social History Narrative  . Not on file   Social Determinants of Health   Financial Resource Strain: Not on file  Food Insecurity: Not on file  Transportation Needs: Not on file  Physical Activity: Not on file  Stress: Not on file  Social Connections: Not on file   Additional Social History:    Allergies:   Allergies  Allergen Reactions  . Ivp Dye [  Iodinated Diagnostic Agents] Swelling    Labs:  Results for orders placed or performed during the hospital encounter of 05/01/21 (from the past 48 hour(s))  Glucose, capillary     Status: Abnormal   Collection Time: 05/07/21  4:35 PM  Result Value Ref Range   Glucose-Capillary 163 (H) 70 - 99 mg/dL    Comment: Glucose reference range applies only to samples taken after fasting for at least 8 hours.  Urinalysis, Routine w reflex microscopic Urine, Clean Catch     Status: Abnormal   Collection Time: 05/07/21  4:43 PM  Result Value Ref Range   Color, Urine AMBER (A) YELLOW    Comment: BIOCHEMICALS MAY BE AFFECTED BY COLOR   APPearance CLOUDY (A) CLEAR   Specific Gravity, Urine 1.026 1.005 - 1.030   pH 5.0 5.0 - 8.0   Glucose, UA NEGATIVE NEGATIVE mg/dL    Hgb urine dipstick SMALL (A) NEGATIVE   Bilirubin Urine NEGATIVE NEGATIVE   Ketones, ur 20 (A) NEGATIVE mg/dL   Protein, ur NEGATIVE NEGATIVE mg/dL   Nitrite NEGATIVE NEGATIVE   Leukocytes,Ua MODERATE (A) NEGATIVE   RBC / HPF 11-20 0 - 5 RBC/hpf   WBC, UA >50 (H) 0 - 5 WBC/hpf   Bacteria, UA MANY (A) NONE SEEN   Squamous Epithelial / LPF 6-10 0 - 5   Mucus PRESENT    Amorphous Crystal PRESENT     Comment: Performed at [facility], [address], [place], [place] [address]  Hemoglobin and hematocrit, blood     Status: Abnormal   Collection Time: 05/07/21  7:04 PM  Result Value Ref Range   Hemoglobin 16.4 (H) 12.0 - 15.0 g/dL   HCT 47.2 (H) 36.0 - 46.0 %    Comment: Performed at [facility] [address] [place], [place] [address]  Type and screen [place]     Status: None   Collection Time: 05/07/21  7:05 PM  Result Value Ref Range   ABO/RH(D) O POS    Antibody Screen NEG    Sample Expiration      05/10/2021,2359 Performed at [facility], [place] [address], [place], [place] [address]   Glucose, capillary     Status: Abnormal   Collection Time: 05/07/21  9:06 PM  Result Value Ref Range   Glucose-Capillary 140 (H) 70 - 99 mg/dL    Comment: Glucose reference range applies only to samples taken after fasting for at least 8 hours.   Comment 1 Notify RN    Comment 2 Document in Chart   Procalcitonin     Status: None   Collection Time: 05/08/21  2:25 AM  Result Value Ref Range   Procalcitonin 0.13 ng/mL    Comment:        Interpretation: PCT (Procalcitonin) <= 0.5 ng/mL: Systemic infection (sepsis) is not likely. Local bacterial infection is possible. (NOTE)       Sepsis PCT Algorithm           Lower Respiratory Tract                                      Infection PCT Algorithm    ----------------------------     ----------------------------         PCT < 0.25 ng/mL                PCT < 0.10  ng/mL          Strongly encourage              Strongly discourage   discontinuation of antibiotics    initiation of antibiotics    ----------------------------     -----------------------------       PCT 0.25 - 0.50 ng/mL            PCT 0.10 - 0.25 ng/mL               OR       >80% decrease in PCT            Discourage initiation of                                            antibiotics      Encourage discontinuation           of antibiotics    ----------------------------     -----------------------------         PCT >= 0.50 ng/mL              PCT 0.26 - 0.50 ng/mL               AND        <80% decrease in PCT             Encourage initiation of                                             antibiotics       Encourage continuation           of antibiotics    ----------------------------     -----------------------------        PCT >= 0.50 ng/mL                  PCT > 0.50 ng/mL               AND         increase in PCT                  Strongly encourage                                      initiation of antibiotics    Strongly encourage escalation           of antibiotics                                     -----------------------------                                           PCT <= 0.25 ng/mL                                                 OR                                        >  80% decrease in PCT                                      Discontinue / Do not initiate                                             antibiotics  Performed at [facility], [place] [address], [place], [place] [address]   CBC     Status: Abnormal   Collection Time: 05/08/21  2:25 AM  Result Value Ref Range   WBC 15.9 (H) 4.0 - 10.5 K/uL   RBC 5.23 (H) 3.87 - 5.11 MIL/uL   Hemoglobin 15.9 (H) 12.0 - 15.0 g/dL   HCT 46.6 (H) 36.0 - 46.0 %   MCV 89.1 80.0 - 100.0 fL   MCH 30.4 26.0 - 34.0 pg   MCHC 34.1 30.0 - 36.0 g/dL   RDW 12.8 11.5 - 15.5 %   Platelets 203 150 - 400 K/uL   nRBC 0.0 0.0 - 0.2 %    Comment: Performed at [facility], [place] [address], [place], [place] [address]  CK     Status: None   Collection Time: 05/08/21  2:25 AM  Result Value Ref Range   Total CK 48 38 - 234 U/L    Comment: Performed at [facility], [place] [address], [place], [place] [address]  Glucose, capillary     Status: Abnormal   Collection Time: 05/08/21  6:11 AM  Result Value Ref Range   Glucose-Capillary 157 (H) 70 - 99 mg/dL    Comment: Glucose reference range applies only to samples taken after fasting for at least 8 hours.   Comment 1 Notify RN    Comment 2 Document in Chart   Glucose, capillary     Status: Abnormal   Collection Time: 05/08/21 11:33 AM  Result Value Ref Range   Glucose-Capillary 149 (H) 70 - 99 mg/dL    Comment: Glucose reference range applies only to samples taken after fasting for at least 8 hours.  Glucose, capillary     Status: Abnormal   Collection Time: 05/08/21  4:56 PM  Result Value Ref Range   Glucose-Capillary 145 (H) 70 - 99 mg/dL    Comment: Glucose reference range applies only to samples taken after fasting for at least 8 hours.  Glucose, capillary     Status: Abnormal   Collection Time: 05/08/21  9:05 PM  Result Value Ref Range   Glucose-Capillary 130 (H) 70 - 99 mg/dL    Comment: Glucose reference range applies only to samples taken after fasting for at least 8 hours.   Comment 1 Notify RN    Comment 2 Document in Chart   Procalcitonin     Status: None   Collection Time: 05/09/21  3:07 AM  Result Value Ref Range   Procalcitonin 0.16 ng/mL    Comment:        Interpretation: PCT (Procalcitonin) <= 0.5 ng/mL: Systemic infection (sepsis) is not likely. Local bacterial infection is possible. (NOTE)       Sepsis PCT Algorithm           Lower Respiratory Tract  Infection PCT Algorithm    ----------------------------     ----------------------------         PCT < 0.25 ng/mL                PCT < 0.10 ng/mL          Strongly encourage              Strongly discourage   discontinuation of antibiotics    initiation of antibiotics    ----------------------------     -----------------------------       PCT 0.25 - 0.50 ng/mL            PCT 0.10 - 0.25 ng/mL               OR       >80% decrease in PCT            Discourage initiation of                                            antibiotics      Encourage discontinuation           of antibiotics    ----------------------------     -----------------------------         PCT >= 0.50 ng/mL              PCT 0.26 - 0.50 ng/mL               AND        <80% decrease in PCT             Encourage initiation of                                             antibiotics       Encourage continuation           of antibiotics    ----------------------------     -----------------------------        PCT >= 0.50 ng/mL                  PCT > 0.50 ng/mL               AND         increase in PCT                  Strongly encourage                                      initiation of antibiotics    Strongly encourage escalation           of antibiotics                                     -----------------------------                                           PCT <= 0.25 ng/mL  OR                                        > 80% decrease in PCT                                      Discontinue / Do not initiate                                             antibiotics  Performed at [facility], [place] [address] [place], [place] [address]   CBC     Status: Abnormal   Collection Time: 05/09/21  3:07 AM  Result Value Ref Range   WBC 15.2 (H) 4.0 - 10.5 K/uL   RBC 5.05 3.87 - 5.11 MIL/uL   Hemoglobin 15.6 (H) 12.0 - 15.0 g/dL   HCT 45.1 36.0 - 46.0 %   MCV 89.3 80.0 - 100.0 fL   MCH 30.9 26.0 - 34.0 pg   MCHC 34.6 30.0 - 36.0 g/dL   RDW 12.7 11.5 - 15.5 %   Platelets 211 150 - 400 K/uL   nRBC 0.0 0.0 - 0.2 %    Comment: Performed at [facility], [place] [address] [place], [place] [address]  Glucose, capillary     Status: Abnormal   Collection Time: 05/09/21  6:09 AM  Result Value Ref Range   Glucose-Capillary 136 (H) 70 - 99 mg/dL    Comment: Glucose reference range applies only to samples taken after fasting for at least 8 hours.   Comment 1 Notify RN    Comment 2 Document in Chart   Glucose, capillary     Status: Abnormal   Collection Time: 05/09/21 11:35 AM  Result Value Ref Range   Glucose-Capillary 128 (H) 70 - 99 mg/dL    Comment: Glucose reference range applies only to samples taken after fasting for at least 8 hours.    Current Facility-Administered Medications  Medication Dose Route Frequency Provider Last Rate Last Admin  . acetaminophen (TYLENOL) tablet 650 mg  650 mg Oral Q4H PRN Zierle-Ghosh, Asia B, DO   650 mg at 05/09/21 0558   Or  . acetaminophen (TYLENOL) 160 MG/5ML solution 650 mg  650 mg Per Tube Q4H PRN Zierle-Ghosh, Asia B, DO       Or  . acetaminophen (TYLENOL) suppository 650 mg  650 mg Rectal Q4H PRN Zierle-Ghosh, Asia B, DO      . cefTRIAXone (ROCEPHIN) 1 g in sodium chloride 0.9 % 100 mL IVPB  1 g Intravenous Q24H Kc, Ramesh, MD 200 mL/hr at 05/08/21 1750 1 g at 05/08/21 1750  . dicyclomine (BENTYL) tablet 20 mg  20 mg Oral TID AC & HS Johnson, Clanford L, MD   20 mg at 05/09/21 0922  . insulin aspart (novoLOG) injection 0-15 Units  0-15 Units Subcutaneous TID WC Zierle-Ghosh, Asia B, DO   2 Units at 05/09/21 1207  . insulin aspart (novoLOG) injection 0-5 Units  0-5 Units Subcutaneous QHS Zierle-Ghosh, Asia B, DO   2 Units at 05/04/21 2218  . insulin aspart (novoLOG) injection 5 Units  5 Units Subcutaneous TID WC Johnson, Clanford L, MD  5 Units at 05/06/21 1630  . insulin glargine-yfgn (SEMGLEE) injection 10 Units  10 Units Subcutaneous Daily Murlean Iba, MD   10 Units at 05/09/21 [id number]  . levothyroxine (SYNTHROID) tablet 50 mcg  50 mcg Oral Q0600 Irwin Brakeman L, MD   50 mcg at 05/09/21 0558   . metoprolol tartrate (LOPRESSOR) tablet 12.5 mg  12.5 mg Oral BID Bonnielee Haff, MD   12.5 mg at 05/09/21 [id number]  . oxyCODONE-acetaminophen (PERCOCET/ROXICET) 5-325 MG per tablet 1 tablet  1 tablet Oral QID PRN [place] Emery, Clanford L, MD   1 tablet at 05/07/21 1020  . pantoprazole (PROTONIX) EC tablet 40 mg  40 mg Oral BID Antonieta Pert, MD   40 mg at 05/09/21 0923  . phenol (CHLORASEPTIC) mouth spray 1 spray  1 spray Mouth/Throat PRN Bonnielee Haff, MD      . risperiDONE (RISPERDAL M-TABS) disintegrating tablet 1 mg  1 mg Oral BID [place] Emery, Clanford L, MD   1 mg at 05/09/21 [id number]  . senna-docusate (Senokot-S) tablet 1 tablet  1 tablet Oral QHS PRN Zierle-Ghosh, Asia B, DO      . [START ON 05/10/2021] venlafaxine XR (EFFEXOR-XR) 24 hr capsule 112.5 mg  112.5 mg Oral Daily Rea Kalama A      . zinc sulfate capsule 220 mg  220 mg Oral Daily Damita Dunnings B, MD   220 mg at 05/09/21 [id number]    Psychiatric Specialty Exam:  Presentation  General Appearance: Appropriate for Environment  Eye Contact:Minimal  Speech:-- (hypophonic to whisper and stayed a whisper, but overall clear)  Speech Volume:Decreased  Handedness:No data recorded  Mood and Affect  Mood:-- ("I'm confused.")  Affect:Restricted   Thought Process  Thought Processes:Goal Directed  Descriptions of Associations:Circumstantial  Orientation:Partial (Aware she is in the hospital, says the year is 2045, aware she is in [place])  Thought Content:Perseveration  History of Schizophrenia/Schizoaffective disorder:No  Duration of Psychotic Symptoms:Less than six months  Hallucinations:Hallucinations: None  Ideas of Reference:None  Suicidal Thoughts:Suicidal Thoughts: No  Homicidal Thoughts:Homicidal Thoughts: No   Sensorium  Memory:Immediate Poor; Recent Poor  Judgment:Fair  Insight:None   Executive Functions  Concentration:Poor  Attention Span:Poor  [place]   Psychomotor Activity  Psychomotor Activity:Psychomotor Activity: Decreased   Assets  Assets:Housing; Social Support   Sleep  Sleep:Sleep: Fair   Physical Exam: Physical Exam HENT:     Head: Normocephalic and atraumatic.  Pulmonary:     Effort: Pulmonary effort is normal.  Skin:    General: Skin is dry.  Neurological:     Mental Status: She is alert.   Review of Systems  HENT:  Negative for sore throat.   Psychiatric/Behavioral:  Negative for hallucinations and suicidal ideas.   Blood pressure (!) 149/73, pulse 87, temperature 98 F (36.7 C), temperature source Oral, resp. rate 18, height 5\' 7"  (1.702 m), weight 106.9 kg, SpO2 93 %. Body mass index is 36.91 kg/m.  PGY-2  Joycelyn Schmid A Haru Shaff 05/09/2021 12:20 PM

## 2021-05-10 LAB — GLUCOSE, CAPILLARY
Glucose-Capillary: 113 mg/dL — ABNORMAL HIGH (ref 70–99)
Glucose-Capillary: 121 mg/dL — ABNORMAL HIGH (ref 70–99)
Glucose-Capillary: 148 mg/dL — ABNORMAL HIGH (ref 70–99)
Glucose-Capillary: 133 mg/dL — ABNORMAL HIGH (ref 70–99)

## 2021-05-10 NOTE — Plan of Care (Signed)
  Problem: Education: Goal: Knowledge of General Education information will improve Description: Including pain rating scale, medication(s)/side effects and non-pharmacologic comfort measures Outcome: Progressing   Problem: Health Behavior/Discharge Planning: Goal: Ability to manage health-related needs will improve Outcome: Progressing   Problem: Clinical Measurements: Goal: Ability to maintain clinical measurements within normal limits will improve Outcome: Progressing Goal: Will remain free from infection Outcome: Progressing Goal: Diagnostic test results will improve Outcome: Progressing Goal: Respiratory complications will improve Outcome: Progressing Goal: Cardiovascular complication will be avoided Outcome: Progressing   Problem: Activity: Goal: Risk for activity intolerance will decrease Outcome: Progressing   Problem: Nutrition: Goal: Adequate nutrition will be maintained Outcome: Progressing   Problem: Coping: Goal: Level of anxiety will decrease Outcome: Progressing   Problem: Elimination: Goal: Will not experience complications related to bowel motility Outcome: Progressing Goal: Will not experience complications related to urinary retention Outcome: Progressing   Problem: Pain Managment: Goal: General experience of comfort will improve Outcome: Progressing   Problem: Safety: Goal: Ability to remain free from injury will improve Outcome: Progressing   Problem: Skin Integrity: Goal: Risk for impaired skin integrity will decrease Outcome: Progressing   Problem: Education: Goal: Knowledge of secondary prevention will improve ([facility] ALL) Outcome: Progressing   Problem: Ischemic Stroke/TIA Tissue Perfusion: Goal: Complications of ischemic stroke/TIA will be minimized Outcome: Progressing

## 2021-05-10 NOTE — Discharge Summary (Signed)
Physician Discharge Summary  Sheryl Henry IWO:[id number] DOB: 1956-02-03 DOA: 05/01/2021  PCP: [name], NP  Admit date: 05/01/2021 Discharge date: 05/12/2021  Admitted From: home Disposition:  home  Recommendations for Outpatient Follow-up:  Follow up with PCP in 1-2 weeks Please obtain BMP/CBC in one week  Home Health:no  Equipment/Devices: non  Discharge Condition: Stable Code Status:   Code Status: Full Code Diet recommendation:  Diet Order             Diet heart healthy/carb modified Room service appropriate? Yes; Fluid consistency: Thin  Diet effective now                 Brief/Interim Summary: 65 y.o. female with PMH of  T2DM, HTN presented to the ED with ringing in her head-1 day prior to presentation, also thinking that her neighbors were watching her with cameras in her house.She reports she heard a high-volume sound in her head and felt like it made her whole head vibrate.  After that she had barely any sleep all night.  Symptoms seem to resolve and then the day of presentation it happened again.  Around lunchtime she had an intense feeling in her head.  She does not characterize it as pain, but cannot describe the sensation.  She laid down to take a nap at 2:00.  When she woke up she had the high-volume sound in her head again.  She reports that it happened so fast she cannot describe the characteristics of it.  He goes "through her head" but she cannot describe where it starts and finishes.  She says that the feeling that she has after that is like her brain needs a repeat.  It is an intense feeling and she does not know how to make it go away.  She knows for sure the soundwave brings it on.  The feeling that her brain needs to remove it is in both of her temples.  She again says that its not the pain.  Per patient, this soundwave is her main concern. Son had told the ER that she has had this before and she has been off some of her psych medications.  They  were planning to discharge her home with follow-up to psychiatry, patient then announced that she has not been able to walk all day. She reports she feels like she is staggering.  She does not feel dizzy or like the room is spinning.   Her LE weakness worsened after receiving the flu and covid vaccines recently. Patient was admitted.Seen by psychiatry, neurology-recommend MRI cervical spine-that was negative for acute significant findings. She had episode of rectal bleeding- seen by GI-since no recurrence of rectal bleeding noted holding off on EGD and colonoscopy at this time will need outpatient screening colonoscopy. Seen by PT OT and has advised skilled nursing facility. Neuro and psych signed off and patient is medically stable for discharge, pending bed placement Discussed w/ psych and okay for discharge today  Discharge Diagnoses:  LE weakness abrupt onset: MRI cervical spine no acute significant finding. S/P lumbar puncture, elevated protein level likely nonspecific-seen by neurology unlikely GBS given sudden onset of symptoms and intact reflex.  Could be multifactorial deconditioning, medication related possible neuropathy.  CK normal.  Continue PT OT and plan is for placement.   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  Well-controlled on Glargine 10 units and SSI. Recent Labs  Lab 05/11/21 1158 05/11/21 1635 05/11/21 2137 05/12/21 5003 05/12/21 0801  GLUCAP 143* 162* 165* 172* 174*    Rectal bleeding episode 11/16 evening: Discontinued heparin, placed on PPI-seen by GI-since no recurrence of rectal bleeding noted holding off on EGD and colonoscopy at this time will need outpatient screening colonoscopy.  AL amyloidosis cutaneous: Followed by Dr. Elnoria Howard at [facility] not on systemic treatment previously on thalidomide Revlimid melphalan, was discussed with Dr. Arbie Cookey recommended ordering multiple myeloma panel along with light chains,-multiple myeloma panel  pending, kappa/lambda light chain ratio 1.1 normal although kappa free light chain elevated at one 27.3, patient will need outpatient follow-up.   Acute hypoxia chest x-ray with atelectasis no chest pain or shortness of breath.  Mildly abnormal D-dimer no lower extremity swelling chest pain low suspicion for PTE.  Weaned off oxygen, continue I-S and ambulation   UTI- with leukocytosis- patient is afebrile-Given worsening WBC count repeated UA and chest x-ray.  UA concerning for UTI placed on empiric antibiotics. Patient is incontinent.  Repeat WBC improving. Completed antibiotics 11/20. Recent Labs  Lab 05/06/21 0156 05/07/21 0203 05/08/21 0225 05/09/21 0307 05/11/21 0750  WBC 16.4* 16.1* 15.9* 15.2* 13.3*    Hypothyroidism cont levothyroxine   Migraine headache stable.S/P migraine cocktail on 11/11.  Continue with symptomatic management.   Psych issues/auditary hallucination anxiety/depression on Risperdal venlafaxine-dose adjusted by psychiatry.  Low risk and cleared from psychiatric standpoint for discharge on reeval on 05/12/21.Marland Kitchen   Hypertension: Stable on metoprolol..   Class II Obesity:Patient's Body mass index is 36.91 kg/m. : Will benefit with PCP follow-up, weight loss  healthy lifestyle and outpatient sleep evaluation  Consults: Neurology Psychiatry  Subjective: Aaox3, no complaints today no halucinations.   Discharge Exam: Vitals:   05/12/21 0805 05/12/21 1031  BP: (!) 141/64 134/73  Pulse: 98 92  Resp: 18   Temp: 98.4 F (36.9 C)   SpO2: 93%    General: Pt is alert, awake, not in acute distress Cardiovascular: RRR, S1/S2 +, no rubs, no gallops Respiratory: CTA bilaterally, no wheezing, no rhonchi Abdominal: Soft, NT, ND, bowel sounds + Extremities: no edema, no cyanosis  Discharge Instructions  Discharge Instructions     Discharge instructions   Complete by: As directed    Follow up with psych, pcp as outpatient  Please call call MD or return to ER  for similar or worsening recurring problem that brought you to hospital or if any fever,nausea/vomiting,abdominal pain, uncontrolled pain, chest pain,  shortness of breath or any other alarming symptoms.  Please follow-up your doctor as instructed in a week time and call the office for appointment.  Please avoid alcohol, smoking, or any other illicit substance and maintain healthy habits including taking your regular medications as prescribed.  You were cared for by a hospitalist during your hospital stay. If you have any questions about your discharge medications or the care you received while you were in the hospital after you are discharged, you can call the unit and ask to speak with the hospitalist on call if the hospitalist that took care of you is not available.  Once you are discharged, your primary care physician will handle any further medical issues. Please note that NO REFILLS for any discharge medications will be authorized once you are discharged, as it is imperative that you return to your primary care physician (or establish a relationship with a primary care physician if you do not have one) for your aftercare needs so that they can reassess your need for medications and monitor your lab values   Increase activity  slowly   Complete by: As directed       Allergies as of 05/12/2021       Reactions   Ivp Dye [iodinated Diagnostic Agents] Swelling        Medication List     TAKE these medications    Basaglar KwikPen 100 UNIT/ML Inject 10 Units into the skin daily. What changed: how much to take   cetirizine 10 MG tablet Commonly known as: ZYRTEC Take 1 tablet by mouth daily.   CINNAMON PO Take 1 capsule by mouth in the morning and at bedtime.   COQ10 PO Take 1 tablet by mouth daily.   CRANBERRY PO Take 1 tablet by mouth in the morning and at bedtime.   dicyclomine 20 MG tablet Commonly known as: BENTYL Take 20 mg by mouth 4 (four) times daily.    levothyroxine 50 MCG tablet Commonly known as: SYNTHROID Take 50 mcg by mouth daily.   metoprolol tartrate 25 MG tablet Commonly known as: LOPRESSOR Take 0.5 tablets (12.5 mg total) by mouth 2 (two) times daily.   naproxen sodium 220 MG tablet Commonly known as: ALEVE Take 220 mg by mouth daily as needed (PAIN).   NON FORMULARY "Circulation and Vein support" MVI            Hesperidin  100 mg Vitamin C 30 mg                                     Rutin  20 mg Flavonoids 720 mg                                 Grape seed extract   40 mg Diosmin  600 mg                                    Pine Bark Extract   40 mg Take 1 capsule by mouth every morning Butcher Broom extract 40 mg   Omega-3 1000 MG Caps Take 1 capsule by mouth in the morning and at bedtime.   oxyCODONE-acetaminophen 5-325 MG tablet Commonly known as: PERCOCET/ROXICET Take 1 tablet by mouth 4 (four) times daily as needed for up to 4 doses for moderate pain.   perindopril 4 MG tablet Commonly known as: ACEON Take 4 mg by mouth daily.   risperiDONE 1 MG disintegrating tablet Commonly known as: RISPERDAL M-TABS Take 1 tablet (1 mg total) by mouth 2 (two) times daily.   venlafaxine XR 37.5 MG 24 hr capsule Commonly known as: EFFEXOR-XR Take 3 capsules (112.5 mg total) by mouth daily. Start taking on: May 13, 2021 What changed:  medication strength how much to take   zinc sulfate 220 (50 Zn) MG capsule Take 1 capsule (220 mg total) by mouth daily. Start taking on: May 13, 2021        Contact information for after-discharge care     Destination     [facility] .   Service: Skilled Nursing Contact information: [place] [phone]                    Allergies  Allergen Reactions   Ivp Dye [Iodinated Diagnostic Agents] Swelling    The results of significant diagnostics from this  hospitalization (including imaging, microbiology,  ancillary and laboratory) are listed below for reference.    Microbiology: Recent Results (from the past 240 hour(s))  MRSA Next Gen by PCR, Nasal     Status: None   Collection Time: 05/02/21  9:11 PM   Specimen: Nasal Mucosa; Nasal Swab  Result Value Ref Range Status   MRSA by PCR Next Gen NOT DETECTED NOT DETECTED Final    Comment: (NOTE) The GeneXpert MRSA Assay (FDA approved for NASAL specimens only), is one component of a comprehensive MRSA colonization surveillance program. It is not intended to diagnose MRSA infection nor to guide or monitor treatment for MRSA infections. Test performance is not FDA approved in patients less than 64 years old. Performed at [facility], [address] [place],  [address]     Procedures/Studies: DG Chest 1 View  Result Date: 05/07/2021 CLINICAL DATA:  Leukocytosis EXAM: CHEST  1 VIEW COMPARISON:  Chest radiograph 05/04/2021 FINDINGS: The cardiomediastinal silhouette is stable. Lung volumes are low. Linear opacities in the lung bases are favored to reflect subsegmental atelectasis. The upper lungs are well aerated. There is no pleural effusion. There is no pneumothorax. The bones are stable. IMPRESSION: Low lung volumes with linear opacities in the lung bases favored to reflect atelectasis. Electronically Signed   By: Valetta Mole M.D.   On: 05/07/2021 09:58   CT HEAD WO CONTRAST (5MM)  Result Date: 05/01/2021 CLINICAL DATA:  Headache.  Auditory hallucinations. EXAM: CT HEAD WITHOUT CONTRAST TECHNIQUE: Contiguous axial images were obtained from the base of the skull through the vertex without intravenous contrast. COMPARISON:  None. FINDINGS: Brain: No evidence of acute infarction, hemorrhage, hydrocephalus, extra-axial collection or mass lesion/mass effect. Vascular: Negative for hyperdense vessel Skull: Negative Sinuses/Orbits: Negative Other: None IMPRESSION: Negative CT head Electronically Signed   By: Franchot Gallo M.D.   On: 05/01/2021  17:46   MR ANGIO HEAD WO CONTRAST  Result Date: 05/02/2021 CLINICAL DATA:  Headache, new or worsening (Age >= 50y) EXAM: MR VENOGRAM HEAD WITHOUT CONTRAST MR ANGIOGRAPHY HEAD WITHOUT CONTRAST TECHNIQUE: Angiographic images of the intracranial venous structures were acquired using MRV technique without intravenous contrast. Angiographic images of the Circle of Willis were acquired using MRA technique without intravenous contrast. COMPARISON:  No pertinent prior exam. FINDINGS: MRA head: Intracranial internal carotid arteries are patent. There is a 1.5 mm inferiorly directed outpouching from the distal supraclinoid right ICA. There is likely a small vessel arising from this. Included anterior and middle cerebral arteries are patent. Intracranial vertebral arteries are patent. Basilar artery is patent. Major cerebellar artery origins are patent. Posterior cerebral arteries are patent. MRV head: Superior sagittal sinus, straight sinus, vein of Galen, internal cerebral veins, and basal veins are patent. Both transverse and sigmoid sinuses are patent. Right transverse sinus is dominant. IMPRESSION: No proximal intracranial vessel occlusion. No evidence of dural sinus thrombosis. 1.5 mm outpouching from distal supraclinoid right ICA likely represents an infundibulum rather than aneurysm. Electronically Signed   By: Macy Mis M.D.   On: 05/02/2021 16:15   MR BRAIN WO CONTRAST  Result Date: 05/02/2021 CLINICAL DATA:  Neuro deficit, acute, stroke suspected EXAM: MRI HEAD WITHOUT CONTRAST TECHNIQUE: Multiplanar, multiecho pulse sequences of the brain and surrounding structures were obtained without intravenous contrast. COMPARISON:  None. FINDINGS: Brain: No acute infarction, hemorrhage, hydrocephalus, extra-axial collection or mass lesion. Mild for age scattered T2 hyperintensities in the supratentorial and pontine white matter, nonspecific but compatible with chronic microvascular disease. Mild atrophy.  Vascular:  Major arterial flow voids are maintained at the skull base. Skull and upper cervical spine: Normal marrow signal. Sinuses/Orbits: Clear sinuses.  Unremarkable orbits. Other: No mastoid effusions. IMPRESSION: 1. No evidence of acute intracranial abnormality, including acute infarct. 2. 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 Electronically Signed   By: Margaretha Sheffield M.D.   On: 05/02/2021 11:34   MR CERVICAL SPINE WO CONTRAST  Result Date: 05/06/2021 CLINICAL DATA:  Initial evaluation for myelopathy, acute or progressive. EXAM: MRI CERVICAL SPINE WITHOUT CONTRAST TECHNIQUE: Multiplanar, multisequence MR imaging of the cervical spine was performed. No intravenous contrast was administered. COMPARISON:  None. FINDINGS: Alignment: Examination degraded by motion artifact. Straightening with reversal of the normal cervical lordosis. No listhesis. Vertebrae: Vertebral body height maintained without acute or chronic fracture. Bone marrow signal intensity heterogeneous without worrisome osseous lesion. No abnormal marrow edema. Cord: Signal intensity within the cervical spinal cord grossly within normal limits. No convincing cord signal changes on this motion degraded exam. Posterior Fossa, vertebral arteries, paraspinal tissues: Probable chronic microvascular ischemic disease noted within the partially visualized pons and brainstem. Visualized brain and posterior fossa otherwise unremarkable. Craniocervical junction normal. Paraspinous soft tissues within normal limits. Normal flow voids seen within the vertebral arteries bilaterally. 5 mm left thyroid nodule noted, of doubtful significance given size and patient age, no follow-up imaging recommended (ref: J Am Coll Radiol. 2015 Feb;12(2): 143-50). Disc levels: C2-C3: Negative interspace. Left-sided facet hypertrophy. No stenosis. C3-C4: Negative interspace. Left greater than right facet and ligament flavum hypertrophy. Mild ligament  flavum thickening. Resultant mild spinal stenosis. Mild left C4 foraminal narrowing. Right neural foramen remains patent. C4-C5: Mild disc bulge with endplate spurring. Left-sided facet arthrosis. Mild spinal stenosis. Mild bilateral C5 foraminal narrowing. C5-C6: Advanced degenerative intervertebral disc space narrowing with circumferential disc osteophyte. Flattening and partial effacement of the ventral thecal sac with resultant moderate spinal stenosis. Superimposed left facet and ligament flavum hypertrophy. Moderate bilateral C6 foraminal narrowing. C6-C7: Degenerative intervertebral disc space narrowing with diffuse disc osteophyte complex, slightly asymmetric to the left. No significant spinal stenosis. Mild left C7 foraminal narrowing. C7-T1: Mild disc bulge with uncovertebral spurring. Superimposed left foraminal disc protrusion (series 2, image 14). Mild facet hypertrophy. No spinal stenosis. Mild to moderate left C8 foraminal narrowing. Right foramen remains patent. IMPRESSION: 1. Motion degraded exam. 2. Normal MRI of the cervical spinal cord. No cord signal changes to suggest myelopathy. 3. Multilevel cervical spondylosis with resultant mild to moderate spinal stenosis at C3-4 through C5-6, most pronounced at C5-6. Associated mild left C4 and bilateral C5 foraminal narrowing, moderate bilateral C6 foraminal stenosis, with mild left C7 and C8 foraminal narrowing. Electronically Signed   By: Jeannine Boga M.D.   On: 05/06/2021 05:13   MR THORACIC SPINE WO CONTRAST  Result Date: 05/02/2021 CLINICAL DATA:  Ataxia, nontraumatic, T-spine pathology suspected; Myelopathy, acute or progressive Low back pain, cauda equina syndrome suspected EXAM: MRI THORACIC AND LUMBAR SPINE WITHOUT CONTRAST TECHNIQUE: Multiplanar and multiecho pulse sequences of the thoracic and lumbar spine were obtained without intravenous contrast. COMPARISON:  None. FINDINGS: MRI THORACIC SPINE FINDINGS Mildly motion limited  study. Alignment: No substantial sagittal subluxation. Mild kyphosis at T10-T11. Vertebrae: Vertebral body heights are maintained. No focal marrow edema to suggest acute fracture or discitis/osteomyelitis. Heterogeneous bone marrow without suspicious bone lesion. Cord: Mildly motion limited evaluation without evidence of abnormal cord signal or abnormal cord morphology. Paraspinal and other soft tissues: Unremarkable. Disc levels: Small disc bulge at T10-T11 with ligamentum flavum thickening  and facet hypertrophy at this level. Resulting mild canal stenosis. Otherwise, no significant canal stenosis. Multilevel facet arthropathy moderate foraminal stenosis on the right at T1-T2 and the left at T8-T9. Otherwise, multilevel mild-to-moderate foraminal stenosis. MRI LUMBAR SPINE FINDINGS Segmentation:  Standard. Alignment:  No substantial sagittal subluxation. Vertebrae: Vertebral body heights are maintained. Degenerative/discogenic endplate signal changes about the right eccentric L2-L3 disc. Otherwise, no focal marrow edema to suggest acute fracture discitis/osteomyelitis. Heterogeneous bone marrow without suspicious bone lesion. Conus medullaris and cauda equina: Conus extends to the T12-L1 level. Conus appears normal. Paraspinal and other soft tissues: Unremarkable. Disc levels: T12-L1: Mild disc bulging without significant stenosis. L1-L2: Mild disc bulging and bilateral facet arthropathy without significant stenosis. L2-L3: Left eccentric disc height loss. Left eccentric disc bulging and endplate spurring. Bilateral facet arthropathy. Resulting mild to moderate canal and left greater than right subarticular recess stenosis. Mild-to-moderate left foraminal stenosis with left far lateral disc closely approximating the exiting left L2 nerve. L3-L4: Broad disc bulge with ligamentum flavum thickening and right greater than left facet arthropathy. Resulting moderate canal stenosis with moderate to severe right  subarticular recess and foraminal stenosis. Mild left foraminal stenosis. L4-L5: Broad disc bulge with ligamentum flavum thickening and bilateral facet arthropathy. Resulting mild to moderate canal and bilateral subarticular recess stenosis. Mild bilateral foraminal stenosis. L5-S1: Disc bulging with bilateral facet arthropathy. No significant canal or foraminal stenosis. IMPRESSION: MR THORACIC SPINE IMPRESSION 1. Moderate foraminal stenosis on the right at T1-T2 and the left at T8-T9. Otherwise, multilevel mild-to-moderate foraminal stenosis. 2. Mild canal stenosis at T10-T11. MR LUMBAR SPINE IMPRESSION 1. At L3-L4, moderate to severe right subarticular recess and foraminal stenosis with moderate canal stenosis. 2. At the L2-L3, mild-to-moderate canal and left greater than right subarticular recess stenosis with mild-to-moderate left foraminal stenosis and left far lateral disc closely approximating the exiting left L2 nerve. 3. At L4-L5, mild-to-moderate canal and bilateral subarticular recess stenosis with mild bilateral foraminal stenosis. Electronically Signed   By: Margaretha Sheffield M.D.   On: 05/02/2021 12:03   MR LUMBAR SPINE WO CONTRAST  Result Date: 05/02/2021 CLINICAL DATA:  Ataxia, nontraumatic, T-spine pathology suspected; Myelopathy, acute or progressive Low back pain, cauda equina syndrome suspected EXAM: MRI THORACIC AND LUMBAR SPINE WITHOUT CONTRAST TECHNIQUE: Multiplanar and multiecho pulse sequences of the thoracic and lumbar spine were obtained without intravenous contrast. COMPARISON:  None. FINDINGS: MRI THORACIC SPINE FINDINGS Mildly motion limited study. Alignment: No substantial sagittal subluxation. Mild kyphosis at T10-T11. Vertebrae: Vertebral body heights are maintained. No focal marrow edema to suggest acute fracture or discitis/osteomyelitis. Heterogeneous bone marrow without suspicious bone lesion. Cord: Mildly motion limited evaluation without evidence of abnormal cord signal  or abnormal cord morphology. Paraspinal and other soft tissues: Unremarkable. Disc levels: Small disc bulge at T10-T11 with ligamentum flavum thickening and facet hypertrophy at this level. Resulting mild canal stenosis. Otherwise, no significant canal stenosis. Multilevel facet arthropathy moderate foraminal stenosis on the right at T1-T2 and the left at T8-T9. Otherwise, multilevel mild-to-moderate foraminal stenosis. MRI LUMBAR SPINE FINDINGS Segmentation:  Standard. Alignment:  No substantial sagittal subluxation. Vertebrae: Vertebral body heights are maintained. Degenerative/discogenic endplate signal changes about the right eccentric L2-L3 disc. Otherwise, no focal marrow edema to suggest acute fracture discitis/osteomyelitis. Heterogeneous bone marrow without suspicious bone lesion. Conus medullaris and cauda equina: Conus extends to the T12-L1 level. Conus appears normal. Paraspinal and other soft tissues: Unremarkable. Disc levels: T12-L1: Mild disc bulging without significant stenosis. L1-L2: Mild disc bulging and bilateral facet  arthropathy without significant stenosis. L2-L3: Left eccentric disc height loss. Left eccentric disc bulging and endplate spurring. Bilateral facet arthropathy. Resulting mild to moderate canal and left greater than right subarticular recess stenosis. Mild-to-moderate left foraminal stenosis with left far lateral disc closely approximating the exiting left L2 nerve. L3-L4: Broad disc bulge with ligamentum flavum thickening and right greater than left facet arthropathy. Resulting moderate canal stenosis with moderate to severe right subarticular recess and foraminal stenosis. Mild left foraminal stenosis. L4-L5: Broad disc bulge with ligamentum flavum thickening and bilateral facet arthropathy. Resulting mild to moderate canal and bilateral subarticular recess stenosis. Mild bilateral foraminal stenosis. L5-S1: Disc bulging with bilateral facet arthropathy. No significant canal or  foraminal stenosis. IMPRESSION: MR THORACIC SPINE IMPRESSION 1. Moderate foraminal stenosis on the right at T1-T2 and the left at T8-T9. Otherwise, multilevel mild-to-moderate foraminal stenosis. 2. Mild canal stenosis at T10-T11. MR LUMBAR SPINE IMPRESSION 1. At L3-L4, moderate to severe right subarticular recess and foraminal stenosis with moderate canal stenosis. 2. At the L2-L3, mild-to-moderate canal and left greater than right subarticular recess stenosis with mild-to-moderate left foraminal stenosis and left far lateral disc closely approximating the exiting left L2 nerve. 3. At L4-L5, mild-to-moderate canal and bilateral subarticular recess stenosis with mild bilateral foraminal stenosis. Electronically Signed   By: Margaretha Sheffield M.D.   On: 05/02/2021 12:03   DG CHEST PORT 1 VIEW  Result Date: 05/04/2021 CLINICAL DATA:  Hypoxia EXAM: PORTABLE CHEST 1 VIEW COMPARISON:  None. FINDINGS: Mildly enlarged cardiomediastinal silhouette. Low lung volumes. Bibasilar subsegmental axis. No large effusion or visible pneumothorax. No acute osseous abnormality. IMPRESSION: Low lung volumes with bibasilar atelectasis. Electronically Signed   By: Maurine Simmering M.D.   On: 05/04/2021 19:21   MR MRV HEAD WO CM  Result Date: 05/02/2021 CLINICAL DATA:  Headache, new or worsening (Age >= 50y) EXAM: MR VENOGRAM HEAD WITHOUT CONTRAST MR ANGIOGRAPHY HEAD WITHOUT CONTRAST TECHNIQUE: Angiographic images of the intracranial venous structures were acquired using MRV technique without intravenous contrast. Angiographic images of the Circle of Willis were acquired using MRA technique without intravenous contrast. COMPARISON:  No pertinent prior exam. FINDINGS: MRA head: Intracranial internal carotid arteries are patent. There is a 1.5 mm inferiorly directed outpouching from the distal supraclinoid right ICA. There is likely a small vessel arising from this. Included anterior and middle cerebral arteries are patent. Intracranial  vertebral arteries are patent. Basilar artery is patent. Major cerebellar artery origins are patent. Posterior cerebral arteries are patent. MRV head: Superior sagittal sinus, straight sinus, vein of Galen, internal cerebral veins, and basal veins are patent. Both transverse and sigmoid sinuses are patent. Right transverse sinus is dominant. IMPRESSION: No proximal intracranial vessel occlusion. No evidence of dural sinus thrombosis. 1.5 mm outpouching from distal supraclinoid right ICA likely represents an infundibulum rather than aneurysm. Electronically Signed   By: Macy Mis M.D.   On: 05/02/2021 16:15   ECHOCARDIOGRAM COMPLETE  Result Date: 05/02/2021    ECHOCARDIOGRAM REPORT   Patient Name:   Jancy A Peace Date of Exam: 05/02/2021 Medical Rec #:  [id number]        Height:       67.0 in Accession #:    [id number]       Weight:       235.7 lb Date of Birth:  1956-02-07        BSA:          2.168 m Patient Age:    36 years  BP:           175/79 mmHg Patient Gender: F                HR:           87 bpm. Exam Location:  [facility] Procedure: 2D Echo, Cardiac Doppler and Color Doppler Indications:    CVA  History:        Patient has no prior history of Echocardiogram examinations.                 Risk Factors:Diabetes, Hypertension and Obesity.  Sonographer:    Dustin Flock RDCS Referring Phys: [id number] ASIA B [place]  Sonographer Comments: Technically challenging study due to limited acoustic windows and patient is morbidly obese. IMPRESSIONS  1. Left ventricular ejection fraction, by estimation, is 60 to 65%. The left ventricle has normal function. The left ventricle has no regional wall motion abnormalities. There is moderate left ventricular hypertrophy. Left ventricular diastolic parameters are consistent with Grade I diastolic dysfunction (impaired relaxation).  2. Right ventricular systolic function is normal. The right ventricular size is normal.  3. The mitral valve is normal in  structure. No evidence of mitral valve regurgitation. No evidence of mitral stenosis.  4. The aortic valve is tricuspid. Aortic valve regurgitation is not visualized. No aortic stenosis is present. FINDINGS  Left Ventricle: Left ventricular ejection fraction, by estimation, is 60 to 65%. The left ventricle has normal function. The left ventricle has no regional wall motion abnormalities. The left ventricular internal cavity size was normal in size. There is  moderate left ventricular hypertrophy. Left ventricular diastolic parameters are consistent with Grade I diastolic dysfunction (impaired relaxation). Normal left ventricular filling pressure. Right Ventricle: The right ventricular size is normal. No increase in right ventricular wall thickness. Right ventricular systolic function is normal. Left Atrium: Left atrial size was normal in size. Right Atrium: Right atrial size was normal in size. Pericardium: There is no evidence of pericardial effusion. Mitral Valve: The mitral valve is normal in structure. No evidence of mitral valve regurgitation. No evidence of mitral valve stenosis. Tricuspid Valve: The tricuspid valve is normal in structure. Tricuspid valve regurgitation is not demonstrated. No evidence of tricuspid stenosis. Aortic Valve: The aortic valve is tricuspid. Aortic valve regurgitation is not visualized. No aortic stenosis is present. Aortic valve mean gradient measures 2.6 mmHg. Aortic valve peak gradient measures 4.3 mmHg. Aortic valve area, by VTI measures 2.39 cm. Pulmonic Valve: The pulmonic valve was not well visualized. Pulmonic valve regurgitation is mild. No evidence of pulmonic stenosis. Aorta: The aortic root is normal in size and structure. Venous: The inferior vena cava was not well visualized. IAS/Shunts: No atrial level shunt detected by color flow Doppler.  LEFT VENTRICLE PLAX 2D LVIDd:         3.60 cm     Diastology LVIDs:         2.60 cm     LV e' medial:    5.33 cm/s LV PW:          1.30 cm     LV E/e' medial:  11.6 LV IVS:        1.30 cm     LV e' lateral:   5.44 cm/s LVOT diam:     2.10 cm     LV E/e' lateral: 11.3 LV SV:         53 LV SV Index:   24 LVOT Area:     3.46 cm  LV Volumes (MOD) LV vol d, MOD A4C: 80.6 ml LV vol s, MOD A4C: 32.3 ml LV SV MOD A4C:     80.6 ml RIGHT VENTRICLE RV Basal diam:  1.70 cm RV S prime:     15.20 cm/s TAPSE (M-mode): 1.9 cm LEFT ATRIUM             Index        RIGHT ATRIUM           Index LA diam:        3.30 cm 1.52 cm/m   RA Area:     10.80 cm LA Vol (A2C):   42.5 ml 19.60 ml/m  RA Volume:   23.00 ml  10.61 ml/m LA Vol (A4C):   49.1 ml 22.64 ml/m LA Biplane Vol: 48.5 ml 22.37 ml/m  AORTIC VALVE AV Area (Vmax):    2.70 cm AV Area (Vmean):   2.54 cm AV Area (VTI):     2.39 cm AV Vmax:           103.76 cm/s AV Vmean:          76.637 cm/s AV VTI:            0.221 m AV Peak Grad:      4.3 mmHg AV Mean Grad:      2.6 mmHg LVOT Vmax:         80.90 cm/s LVOT Vmean:        56.200 cm/s LVOT VTI:          0.153 m LVOT/AV VTI ratio: 0.69  AORTA Ao Root diam: 3.10 cm MITRAL VALVE MV Area (PHT): 7.51 cm    SHUNTS MV Decel Time: 101 msec    Systemic VTI:  0.15 m MV E velocity: 61.70 cm/s  Systemic Diam: 2.10 cm MV A velocity: 98.50 cm/s MV E/A ratio:  0.63 Carlyle Dolly MD Electronically signed by Carlyle Dolly MD Signature Date/Time: 05/02/2021/10:13:13 AM    Final    DG FL GUIDED LUMBAR PUNCTURE  Result Date: 05/02/2021 CLINICAL DATA:  Cutaneous amyloidosis, headache, ataxia EXAM: DIAGNOSTIC LUMBAR PUNCTURE UNDER FLUOROSCOPIC GUIDANCE COMPARISON:  CT head 05/01/2021, MR head 05/02/2021 FLUOROSCOPY TIME:  Fluoroscopy Time:  0 minutes 24 seconds Radiation Exposure Index (if provided by the fluoroscopic device): 8.70 mGy Number of Acquired Spot Images: 1 PROCEDURE: Procedure, benefits, and risks were discussed with the patient, including alternatives. Patient's questions were answered. Written informed consent was obtained. Timeout protocol followed.  Patient placed prone. L4-L5 disc space was localized under fluoroscopy. Skin prepped and draped in usual sterile fashion. Skin and soft tissues anesthetized with 2 mL of 1% lidocaine. 22 gauge needle was advanced into the spinal canal where clear colorless CSF was encountered. 11 mL of CSF was obtained in 4 tubes for requested analysis. Procedure tolerated very well by patient without immediate complication. IMPRESSION: Successful fluoro guided lumbar puncture as above. Electronically Signed   By: Lavonia Dana M.D.   On: 05/02/2021 19:30    Labs: BNP (last 3 results) No results for input(s): BNP in the last 8760 hours. Basic Metabolic Panel: Recent Labs  Lab 05/07/21 0203 05/12/21 0928  NA 134* 138  K 3.8 3.4*  CL 100 106  CO2 21* 23  GLUCOSE 162* 188*  BUN 32* 14  CREATININE 0.77 0.66  CALCIUM 8.5* 8.3*   Liver Function Tests: Recent Labs  Lab 05/12/21 0928  AST 17  ALT 17  ALKPHOS 79  BILITOT 0.9  PROT 6.4*  ALBUMIN  2.6*   No results for input(s): LIPASE, AMYLASE in the last 168 hours. No results for input(s): AMMONIA in the last 168 hours. CBC: Recent Labs  Lab 05/06/21 0156 05/07/21 0203 05/07/21 1904 05/08/21 0225 05/09/21 0307 05/11/21 0750  WBC 16.4* 16.1*  --  15.9* 15.2* 13.3*  HGB 15.6* 16.3* 16.4* 15.9* 15.6* 15.7*  HCT 45.5 47.3* 47.2* 46.6* 45.1 45.6  MCV 88.7 87.8  --  89.1 89.3 89.2  PLT 168 188  --  203 211 238   Cardiac Enzymes: Recent Labs  Lab 05/08/21 0225  CKTOTAL 48   BNP: Invalid input(s): POCBNP CBG: Recent Labs  Lab 05/11/21 1158 05/11/21 1635 05/11/21 2137 05/12/21 0614 05/12/21 0801  GLUCAP 143* 162* 165* 172* 174*   D-Dimer No results for input(s): DDIMER in the last 72 hours. Hgb A1c No results for input(s): HGBA1C in the last 72 hours. Lipid Profile No results for input(s): CHOL, HDL, LDLCALC, TRIG, CHOLHDL, LDLDIRECT in the last 72 hours. Thyroid function studies No results for input(s): TSH, T4TOTAL, T3FREE,  THYROIDAB in the last 72 hours.  Invalid input(s): FREET3 Anemia work up No results for input(s): VITAMINB12, FOLATE, FERRITIN, TIBC, IRON, RETICCTPCT in the last 72 hours. Urinalysis    Component Value Date/Time   COLORURINE AMBER (A) 05/07/2021 1643   APPEARANCEUR CLOUDY (A) 05/07/2021 1643   LABSPEC 1.026 05/07/2021 1643   PHURINE 5.0 05/07/2021 1643   GLUCOSEU NEGATIVE 05/07/2021 1643   HGBUR SMALL (A) 05/07/2021 1643   BILIRUBINUR NEGATIVE 05/07/2021 1643   KETONESUR 20 (A) 05/07/2021 1643   PROTEINUR NEGATIVE 05/07/2021 1643   NITRITE NEGATIVE 05/07/2021 1643   LEUKOCYTESUR MODERATE (A) 05/07/2021 1643   Sepsis Labs Invalid input(s): PROCALCITONIN,  WBC,  LACTICIDVEN Microbiology Recent Results (from the past 240 hour(s))  MRSA Next Gen by PCR, Nasal     Status: None   Collection Time: 05/02/21  9:11 PM   Specimen: Nasal Mucosa; Nasal Swab  Result Value Ref Range Status   MRSA by PCR Next Gen NOT DETECTED NOT DETECTED Final    Comment: (NOTE) The GeneXpert MRSA Assay (FDA approved for NASAL specimens only), is one component of a comprehensive MRSA colonization surveillance program. It is not intended to diagnose MRSA infection nor to guide or monitor treatment for MRSA infections. Test performance is not FDA approved in patients less than 31 years old. Performed at [facility], [address], [place], [place] [address]      Time coordinating discharge: 25 minutes  SIGNED: Antonieta Pert, MD  [facility] 05/12/2021, 1:08 PM  If 7PM-7AM, please contact night-coverage [email or web address]

## 2021-05-10 NOTE — Progress Notes (Signed)
PROGRESS NOTE    Sheryl Henry  RSW:[id number] DOB: 1956/03/16 DOA: 05/01/2021 PCP: [name], NP   Chief Complaint  Patient presents with   Hallucinations    Auditory hallucination started after halloween.  "Felt sound-wave in head about one hour away.  Pt thinks neighbors are watching her with cameras in her house.     Brief Narrative/Hospital Course:  Sheryl Henry, 65 y.o. female with PMH of  T2DM, HTN presented to the ED with ringing in her head-1 day prior to presentation, also thinking that her neighbors were watching her with cameras in her house.She reports she heard a high-volume sound in her head and felt like it made her whole head vibrate.  After that she had barely any sleep all night.  Symptoms seem to resolve and then the day of presentation it happened again.  Around lunchtime she had an intense feeling in her head.  She does not characterize it as pain, but cannot describe the sensation.  She laid down to take a nap at 2:00.  When she woke up she had the high-volume sound in her head again.  She reports that it happened so fast she cannot describe the characteristics of it.  He goes "through her head" but she cannot describe where it starts and finishes.  She says that the feeling that she has after that is like her brain needs a repeat.  It is an intense feeling and she does not know how to make it go away.  She knows for sure the soundwave brings it on.  The feeling that her brain needs to remove it is in both of her temples.  She again says that its not the pain.  Per patient, this soundwave is her main concern. Son had told the ER that she has had this before and she has been off some of her psych medications.  They were planning to discharge her home with follow-up to psychiatry, patient then announced that she has not been able to walk all day. She reports she feels like she is staggering.  She does not feel dizzy or like the room is spinning.   Her LE weakness  worsened after receiving the flu and covid vaccines recently. Patient was admitted.Seen by psychiatry, neurology-recommend MRI cervical spine-that was negative for acute significant findings. She had episode of rectal bleeding- seen by GI-since no recurrence of rectal bleeding noted holding off on EGD and colonoscopy at this time will need outpatient screening colonoscopy. Seen by PT OT and has advised skilled nursing facility.  Subjective: Alert awake oriented able to speak while moving extremities new complaints.  Forgetful.  Assessment & Plan:  LE weakness abrupt onset: MRI cervical spine no acute significant finding. S/P lumbar puncture, elevated protein level likely nonspecific-seen by neurology unlikely GBS given sudden onset of symptoms and intact reflex.  Could be multifactorial deconditioning, medication related possible neuropathy.  CK normal.  Continue PT OT and plan is for placement.   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  Well-controlled on Glargine 10 units and SSI. Recent Labs  Lab 05/09/21 0609 05/09/21 1135 05/09/21 1629 05/09/21 2131 05/10/21 0604  GLUCAP 136* 128* 111* 116* 113*   Rectal bleeding episode 11/16 evening: Discontinued heparin, placed on PPI-seen by GI-since no recurrence of rectal bleeding noted holding off on EGD and colonoscopy at this time will need outpatient screening colonoscopy.  AL amyloidosis cutaneous: Followed by Dr. Elnoria Howard at [facility] not on systemic treatment previously  on thalidomide Revlimid melphalan, was discussed with Dr. Arbie Cookey recommended ordering multiple myeloma panel along with light chains,-multiple myeloma panel pending, kappa/lambda light chain ratio 1.1 normal although kappa free light chain elevated at one 27.3, patient will need outpatient follow-up.  Acute hypoxia chest x-ray with atelectasis no chest pain or shortness of breath.  Mildly abnormal D-dimer no lower extremity swelling chest pain  low suspicion for PTE.  Weaned off oxygen, continue I-S and ambulation  UTI- with leukocytosis- patient is afebrile-Given worsening WBC count repeated UA and chest x-ray.  UA concerning for UTI placed on empiric antibiotics. Patient is incontinent.  Repeat WBC count in a.m. Recent Labs  Lab 05/07/21 0203 05/07/21 1904 05/08/21 0225 05/09/21 0307  HGB 16.3* 16.4* 15.9* 15.6*  HCT 47.3* 47.2* 46.6* 45.1  WBC 16.1*  --  15.9* 15.2*  PLT 188  --  203 211     Hypothyroidism On levothyroxine  Migraine headache some headache this morning, S/P migraine cocktail on 11/11.  Continue with symptomatic management.  Psych issues/auditary hallucination anxiety/depression on Risperdal venlafaxine-dose adjusted by psychiatry.  Low risk and cleared from psychiatric standpoint for discharge.  Hypertension: Stable on metoprolol..  Class II Obesity:Patient's Body mass index is 36.91 kg/m. : Will benefit with PCP follow-up, weight loss  healthy lifestyle and outpatient sleep evaluation.  DVT prophylaxis: Place and maintain sequential compression device Start: 05/03/21 0714 SCD's Start: 05/02/21 0238 Code Status:   Code Status: Full Code Family Communication: plan of care discussed with patient at bedside. Status is: Inpatient Remains inpatient appropriate because: For ongoing management of bilateral lower extremity weakness, need for SNF Disposition: SNF once available. Medically stable.  Objective: Vitals last 24 hrs: Vitals:   05/09/21 1959 05/10/21 0024 05/10/21 0344 05/10/21 0739  BP: (!) 142/60 112/60 127/67 124/62  Pulse: (!) 104 91 90 (!) 109  Resp: _0 Temp: 98.6 F (37 C) 98.2 F (36.8 C) 98 F (36.7 C) 98.6 F (37 C)  TempSrc: Oral Oral Oral Oral  SpO2: 92% 93% 93% 92%  Weight:      Height:       Weight change:   Intake/Output Summary (Last 24 hours) at 05/10/2021 0914 Last data filed at 05/09/2021 1026 Gross per 24 hour  Intake 120 ml  Output 400 ml  Net -280  ml    Net IO Since Admission: 443.6 mL [05/10/21 0914]   Physical Examination: General exam: AAOx 3, pleasant, forgetful, not in distress.   HEENT:Oral mucosa moist, Ear/Nose WNL grossly, dentition normal. Respiratory system: bilaterally clear BS, no use of accessory muscle Cardiovascular system: S1 & S2 +, No JVD,. Gastrointestinal system: Abdomen soft, NT,ND, BS+ Nervous System:Alert, awake, moving extremities and grossly nonfocal Extremities: no edema, distal peripheral pulses palpable.  Skin: No rashes,no icterus. MSK: Normal muscle bulk,tone, power   Medications reviewed: Scheduled Meds:  dicyclomine  20 mg Oral TID AC & HS   insulin aspart  0-15 Units Subcutaneous TID WC   insulin aspart  0-5 Units Subcutaneous QHS   insulin aspart  5 Units Subcutaneous TID WC   insulin glargine-yfgn  10 Units Subcutaneous Daily   levothyroxine  50 mcg Oral Q0600   metoprolol tartrate  12.5 mg Oral BID   pantoprazole  40 mg Oral BID   risperiDONE  1 mg Oral BID   venlafaxine XR  112.5 mg Oral Daily   zinc sulfate  220 mg Oral Daily   Continuous Infusions:  cefTRIAXone (ROCEPHIN)  IV 1 g (  05/09/21 1724)    Diet Order             Diet heart healthy/carb modified Room service appropriate? Yes; Fluid consistency: Thin  Diet effective now                  Weight change:   Wt Readings from Last 3 Encounters:  05/02/21 106.9 kg     Consultants:see note  Procedures:see note Antimicrobials: Anti-infectives (From admission, onward)    Start     Dose/Rate Route Frequency Ordered Stop   05/07/21 1800  cefTRIAXone (ROCEPHIN) 1 g in sodium chloride 0.9 % 100 mL IVPB        1 g 200 mL/hr over 30 Minutes Intravenous Every 24 hours 05/07/21 1722        Culture/Microbiology No results found for: SDES, SPECREQUEST, CULT, REPTSTATUS  Other culture-see note  Unresulted Labs (From admission, onward)    None     Data Reviewed: I have personally reviewed following labs and imaging  studies CBC: Recent Labs  Lab 05/05/21 0301 05/06/21 0156 05/07/21 0203 05/07/21 1904 05/08/21 0225 05/09/21 0307  WBC 19.0* 16.4* 16.1*  --  15.9* 15.2*  HGB 15.1* 15.6* 16.3* 16.4* 15.9* 15.6*  HCT 43.3 45.5 47.3* 47.2* 46.6* 45.1  MCV 87.8 88.7 87.8  --  89.1 89.3  PLT 117* 168 188  --  203 572    Basic Metabolic Panel: Recent Labs  Lab 05/04/21 1214 05/05/21 0301 05/07/21 0203  NA 137 137 134*  K 4.4 4.1 3.8  CL 106 105 100  CO2 24 25 21*  GLUCOSE 208* 149* 162*  BUN 22 23 32*  CREATININE 0.94 0.79 0.77  CALCIUM 8.3* 8.4* 8.5*  MG 2.4 2.4  --     GFR: Estimated Creatinine Clearance: 89.4 mL/min (by C-G formula based on SCr of 0.77 mg/dL). Liver Function Tests: Recent Labs  Lab 05/05/21 0301  AST 30  ALT 27  ALKPHOS 76  BILITOT 0.7  PROT 6.3*  ALBUMIN 2.8*    No results for input(s): LIPASE, AMYLASE in the last 168 hours. No results for input(s): AMMONIA in the last 168 hours. Coagulation Profile: No results for input(s): INR, PROTIME in the last 168 hours. Cardiac Enzymes: Recent Labs  Lab 05/08/21 0225  CKTOTAL 48    BNP (last 3 results) No results for input(s): PROBNP in the last 8760 hours. HbA1C: No results for input(s): HGBA1C in the last 72 hours. CBG: Recent Labs  Lab 05/09/21 0609 05/09/21 1135 05/09/21 1629 05/09/21 2131 05/10/21 0604  GLUCAP 136* 128* 111* 116* 113*    Lipid Profile: No results for input(s): CHOL, HDL, LDLCALC, TRIG, CHOLHDL, LDLDIRECT in the last 72 hours. Thyroid Function Tests: No results for input(s): TSH, T4TOTAL, FREET4, T3FREE, THYROIDAB in the last 72 hours. Anemia Panel: No results for input(s): VITAMINB12, FOLATE, FERRITIN, TIBC, IRON, RETICCTPCT in the last 72 hours.  Sepsis Labs: Recent Labs  Lab 05/07/21 1107 05/08/21 0225 05/09/21 0307  PROCALCITON 0.13 0.13 0.16     Recent Results (from the past 240 hour(s))  Resp Panel by RT-PCR (Flu A&B, Covid) Urine, Clean Catch     Status:  None   Collection Time: 05/02/21 12:06 AM   Specimen: Urine, Clean Catch; Nasopharyngeal(NP) swabs in vial transport medium  Result Value Ref Range Status   SARS Coronavirus 2 by RT PCR NEGATIVE NEGATIVE Final    Comment: (NOTE) SARS-CoV-2 target nucleic acids are NOT DETECTED.  The SARS-CoV-2 RNA is generally detectable  in upper respiratory specimens during the acute phase of infection. The lowest concentration of SARS-CoV-2 viral copies this assay can detect is 138 copies/mL. A negative result does not preclude SARS-Cov-2 infection and should not be used as the sole basis for treatment or other patient management decisions. A negative result may occur with  improper specimen collection/handling, submission of specimen other than nasopharyngeal swab, presence of viral mutation(s) within the areas targeted by this assay, and inadequate number of viral copies(<138 copies/mL). A negative result must be combined with clinical observations, patient history, and epidemiological information. The expected result is Negative.  Fact Sheet for Patients:  EntrepreneurPulse.com.au  Fact Sheet for Healthcare Providers:  IncredibleEmployment.be  This test is no t yet approved or cleared by the [place] FDA and  has been authorized for detection and/or diagnosis of SARS-CoV-2 by FDA under an Emergency Use Authorization (EUA). This EUA will remain  in effect (meaning this test can be used) for the duration of the COVID-19 declaration under Section 564(b)(1) of the Act, 21 U.S.C.section 360bbb-3(b)(1), unless the authorization is terminated  or revoked sooner.       Influenza A by PCR NEGATIVE NEGATIVE Final   Influenza B by PCR NEGATIVE NEGATIVE Final    Comment: (NOTE) The Xpert Xpress SARS-CoV-2/FLU/RSV plus assay is intended as an aid in the diagnosis of influenza from Nasopharyngeal swab specimens and should not be used as a sole basis for  treatment. Nasal washings and aspirates are unacceptable for Xpert Xpress SARS-CoV-2/FLU/RSV testing.  Fact Sheet for Patients: EntrepreneurPulse.com.au  Fact Sheet for Healthcare Providers: IncredibleEmployment.be  This test is not yet approved or cleared by the [place] FDA and has been authorized for detection and/or diagnosis of SARS-CoV-2 by FDA under an Emergency Use Authorization (EUA). This EUA will remain in effect (meaning this test can be used) for the duration of the COVID-19 declaration under Section 564(b)(1) of the Act, 21 U.S.C. section 360bbb-3(b)(1), unless the authorization is terminated or revoked.  Performed at [facility], [address] [place], [place] [address]   MRSA Next Gen by PCR, Nasal     Status: None   Collection Time: 05/02/21  9:11 PM   Specimen: Nasal Mucosa; Nasal Swab  Result Value Ref Range Status   MRSA by PCR Next Gen NOT DETECTED NOT DETECTED Final    Comment: (NOTE) The GeneXpert MRSA Assay (FDA approved for NASAL specimens only), is one component of a comprehensive MRSA colonization surveillance program. It is not intended to diagnose MRSA infection nor to guide or monitor treatment for MRSA infections. Test performance is not FDA approved in patients less than 29 years old. Performed at [facility], [address], [place],  [address]       Radiology Studies: No results found.   LOS: 5 days   Antonieta Pert, MD [facility]  05/10/2021, 9:14 AM

## 2021-05-11 LAB — GLUCOSE, CAPILLARY
Glucose-Capillary: 143 mg/dL — ABNORMAL HIGH (ref 70–99)
Glucose-Capillary: 143 mg/dL — ABNORMAL HIGH (ref 70–99)
Glucose-Capillary: 162 mg/dL — ABNORMAL HIGH (ref 70–99)
Glucose-Capillary: 165 mg/dL — ABNORMAL HIGH (ref 70–99)

## 2021-05-11 LAB — CBC
HCT: 45.6 % (ref 36.0–46.0)
Hemoglobin: 15.7 g/dL — ABNORMAL HIGH (ref 12.0–15.0)
MCH: 30.7 pg (ref 26.0–34.0)
MCHC: 34.4 g/dL (ref 30.0–36.0)
MCV: 89.2 fL (ref 80.0–100.0)
Platelets: 238 10*3/uL (ref 150–400)
RBC: 5.11 MIL/uL (ref 3.87–5.11)
RDW: 12.7 % (ref 11.5–15.5)
WBC: 13.3 10*3/uL — ABNORMAL HIGH (ref 4.0–10.5)
nRBC: 0 % (ref 0.0–0.2)

## 2021-05-11 MED ORDER — LACTATED RINGERS IV SOLN: INTRAVENOUS | Status: DC

## 2021-05-11 NOTE — Plan of Care (Signed)
  Problem: Education: Goal: Knowledge of General Education information will improve Description: Including pain rating scale, medication(s)/side effects and non-pharmacologic comfort measures Outcome: Progressing   Problem: Clinical Measurements: Goal: Ability to maintain clinical measurements within normal limits will improve Outcome: Progressing Goal: Will remain free from infection Outcome: Progressing Goal: Diagnostic test results will improve Outcome: Progressing Goal: Respiratory complications will improve Outcome: Progressing Goal: Cardiovascular complication will be avoided Outcome: Progressing   Problem: Activity: Goal: Risk for activity intolerance will decrease Outcome: Progressing   Problem: Nutrition: Goal: Adequate nutrition will be maintained Outcome: Progressing   Problem: Coping: Goal: Level of anxiety will decrease Outcome: Progressing   Problem: Elimination: Goal: Will not experience complications related to bowel motility Outcome: Progressing Goal: Will not experience complications related to urinary retention Outcome: Progressing   Problem: Pain Managment: Goal: General experience of comfort will improve Outcome: Progressing   Problem: Safety: Goal: Ability to remain free from injury will improve Outcome: Progressing   Problem: Skin Integrity: Goal: Risk for impaired skin integrity will decrease Outcome: Progressing   Problem: Education: Goal: Knowledge of secondary prevention will improve (SELECT ALL) Outcome: Progressing   Problem: Ischemic Stroke/TIA Tissue Perfusion: Goal: Complications of ischemic stroke/TIA will be minimized Outcome: Progressing

## 2021-05-11 NOTE — Progress Notes (Signed)
Patient refusing food and taking in minimal fluids for this RN today. Tried several approaches. Patient statements seem confusing, unclear to this RN her reason for behavior. No s/s distress or discomfort, vitals WNL. MD attending notified by chat. Will continue to encourage intake and monitor.

## 2021-05-11 NOTE — Plan of Care (Signed)
  Problem: Education: Goal: Knowledge of General Education information will improve Description: Including pain rating scale, medication(s)/side effects and non-pharmacologic comfort measures Outcome: Progressing   Problem: Health Behavior/Discharge Planning: Goal: Ability to manage health-related needs will improve Outcome: Progressing   Problem: Clinical Measurements: Goal: Ability to maintain clinical measurements within normal limits will improve Outcome: Progressing Goal: Will remain free from infection Outcome: Progressing Goal: Diagnostic test results will improve Outcome: Progressing Goal: Respiratory complications will improve Outcome: Progressing Goal: Cardiovascular complication will be avoided Outcome: Progressing   Problem: Activity: Goal: Risk for activity intolerance will decrease Outcome: Progressing   Problem: Nutrition: Goal: Adequate nutrition will be maintained Outcome: Progressing   Problem: Coping: Goal: Level of anxiety will decrease Outcome: Progressing   Problem: Elimination: Goal: Will not experience complications related to bowel motility Outcome: Progressing Goal: Will not experience complications related to urinary retention Outcome: Progressing   Problem: Pain Managment: Goal: General experience of comfort will improve Outcome: Progressing   Problem: Safety: Goal: Ability to remain free from injury will improve Outcome: Progressing   Problem: Skin Integrity: Goal: Risk for impaired skin integrity will decrease Outcome: Progressing   Problem: Education: Goal: Knowledge of secondary prevention will improve ([facility] ALL) Outcome: Progressing   Problem: Ischemic Stroke/TIA Tissue Perfusion: Goal: Complications of ischemic stroke/TIA will be minimized Outcome: Progressing

## 2021-05-11 NOTE — Progress Notes (Signed)
Son and daughter-in-law noted significant behavior change since yesterday - hallucinations unseen since 2017, and not like the behaviors they saw in recent ED visit. Patient has been pleasant, but sees people in room, imagines she had several visits today from friends and family. MD Lanae Boast notified by chat. Night RN to alert nursing tomorrow. MD responded asks nursing to notify psych tomorrow.

## 2021-05-11 NOTE — Progress Notes (Signed)
PROGRESS NOTE    Sheryl Henry  ZHY:[id number] DOB: March 01, 1956 DOA: 05/01/2021 PCP: [name], NP   Chief Complaint  Patient presents with   Hallucinations    Auditory hallucination started after halloween.  "Felt sound-wave in head about one hour away.  Pt thinks neighbors are watching her with cameras in her house.     Brief Narrative/Hospital Course:  KATRESE SHELL, 65 y.o. female with PMH of  T2DM, HTN presented to the ED with ringing in her head-1 day prior to presentation, also thinking that her neighbors were watching her with cameras in her house.She reports she heard a high-volume sound in her head and felt like it made her whole head vibrate.  After that she had barely any sleep all night.  Symptoms seem to resolve and then the day of presentation it happened again.  Around lunchtime she had an intense feeling in her head.  She does not characterize it as pain, but cannot describe the sensation.  She laid down to take a nap at 2:00.  When she woke up she had the high-volume sound in her head again.  She reports that it happened so fast she cannot describe the characteristics of it.  He goes "through her head" but she cannot describe where it starts and finishes.  She says that the feeling that she has after that is like her brain needs a repeat.  It is an intense feeling and she does not know how to make it go away.  She knows for sure the soundwave brings it on.  The feeling that her brain needs to remove it is in both of her temples.  She again says that its not the pain.  Per patient, this soundwave is her main concern. Son had told the ER that she has had this before and she has been off some of her psych medications.  They were planning to discharge her home with follow-up to psychiatry, patient then announced that she has not been able to walk all day. She reports she feels like she is staggering.  She does not feel dizzy or like the room is spinning.   Her LE weakness  worsened after receiving the flu and covid vaccines recently. Patient was admitted.Seen by psychiatry, neurology-recommend MRI cervical spine-that was negative for acute significant findings. She had episode of rectal bleeding- seen by GI-since no recurrence of rectal bleeding noted holding off on EGD and colonoscopy at this time will need outpatient screening colonoscopy. Seen by PT OT and has advised skilled nursing facility.  Subjective: No fever overnight BP stable Aaox3, speech clear, moving LE well Asking for her spplement form home  Assessment & Plan:  LE weakness abrupt onset: MRI cervical spine no acute significant finding. S/P lumbar puncture, elevated protein level likely nonspecific-seen by neurology unlikely GBS given sudden onset of symptoms and intact reflex.  Could be multifactorial deconditioning, medication related possible neuropathy.  CK normal.  Continue PT OT and plan is for placement.   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  Well-controlled on Glargine 10 units and SSI. Recent Labs  Lab 05/10/21 0604 05/10/21 1221 05/10/21 1558 05/10/21 2132 05/11/21 0604  GLUCAP 113* 121* 148* 133* 143*   Rectal bleeding episode 11/16 evening: Discontinued heparin, placed on PPI-seen by GI-since no recurrence of rectal bleeding noted holding off on EGD and colonoscopy at this time will need outpatient screening colonoscopy.  AL amyloidosis cutaneous: Followed by Dr. Elnoria Howard at [facility] not  on systemic treatment previously on thalidomide Revlimid melphalan, was discussed with Dr. Arbie Cookey recommended ordering multiple myeloma panel along with light chains,-multiple myeloma panel pending, kappa/lambda light chain ratio 1.1 normal although kappa free light chain elevated at one 27.3, patient will need outpatient follow-up.  Acute hypoxia chest x-ray with atelectasis no chest pain or shortness of breath.  Mildly abnormal D-dimer no lower extremity  swelling chest pain low suspicion for PTE.  Weaned off oxygen, continue I-S and ambulation  UTI- with leukocytosis- patient is afebrile-Given worsening WBC count repeated UA and chest x-ray.  UA concerning for UTI placed on empiric antibiotics. Patient is incontinent.  Repeat WBC count in a.m. Recent Labs  Lab 05/07/21 0203 05/07/21 1904 05/08/21 0225 05/09/21 0307  HGB 16.3* 16.4* 15.9* 15.6*  HCT 47.3* 47.2* 46.6* 45.1  WBC 16.1*  --  15.9* 15.2*  PLT 188  --  203 211     Hypothyroidism On levothyroxine  Migraine headache some headache this morning, S/P migraine cocktail on 11/11.  Continue with symptomatic management.  Psych issues/auditary hallucination anxiety/depression on Risperdal venlafaxine-dose adjusted by psychiatry.  Low risk and cleared from psychiatric standpoint for discharge.  Hypertension: Stable on metoprolol..  Class II Obesity:Patient's Body mass index is 36.91 kg/m. : Will benefit with PCP follow-up, weight loss  healthy lifestyle and outpatient sleep evaluation.  DVT prophylaxis: Place and maintain sequential compression device Start: 05/03/21 0714 SCD's Start: 05/02/21 0238 Code Status:   Code Status: Full Code Family Communication: plan of care discussed with patient at bedside. Status is: Inpatient Remains inpatient appropriate because: For ongoing management of bilateral lower extremity weakness, need for SNF Disposition: SNF once available. Stable. Pending insurance authorization   Objective: Vitals last 24 hrs: Vitals:   05/10/21 1603 05/10/21 1940 05/10/21 2312 05/11/21 0406  BP: (!) 133/52 133/70 (!) 153/68 (!) 135/59  Pulse: 97 96 87 84  Resp: _0 Temp: 98.2 F (36.8 C) 98.3 F (36.8 C) 98.1 F (36.7 C) 97.9 F (36.6 C)  TempSrc: Oral Oral Oral Oral  SpO2: 92% 92% 96% 94%  Weight:      Height:       Weight change:   Intake/Output Summary (Last 24 hours) at 05/11/2021 4259 Last data filed at 05/10/2021 2200 Gross per 24  hour  Intake 220 ml  Output --  Net 220 ml    Net IO Since Admission: 663.6 mL [05/11/21 0718]   Physical Examination: General exam: AAOx 3 older than stated age, weak appearing. HEENT:Oral mucosa moist, Ear/Nose WNL grossly, dentition normal. Respiratory system: bilaterally diminished, no use of accessory muscle Cardiovascular system: S1 & S2 +, No JVD,. Gastrointestinal system: Abdomen soft,NT,ND, BS+ Nervous System:Alert, awake, moving extremities and grossly nonfocal Extremities: No edema, distal peripheral pulses palpable.  Skin: No rashes,no icterus. MSK: Normal muscle bulk,tone, power   Medications reviewed: Scheduled Meds:  dicyclomine  20 mg Oral TID AC & HS   insulin aspart  0-15 Units Subcutaneous TID WC   insulin aspart  0-5 Units Subcutaneous QHS   insulin aspart  5 Units Subcutaneous TID WC   insulin glargine-yfgn  10 Units Subcutaneous Daily   levothyroxine  50 mcg Oral Q0600   metoprolol tartrate  12.5 mg Oral BID   pantoprazole  40 mg Oral BID   risperiDONE  1 mg Oral BID   venlafaxine XR  112.5 mg Oral Daily   zinc sulfate  220 mg Oral Daily   Continuous Infusions:  cefTRIAXone (ROCEPHIN)  IV 1 g (05/10/21 1741)    Diet Order             Diet heart healthy/carb modified Room service appropriate? Yes; Fluid consistency: Thin  Diet effective now                  Weight change:   Wt Readings from Last 3 Encounters:  05/02/21 106.9 kg     Consultants:see note  Procedures:see note Antimicrobials: Anti-infectives (From admission, onward)    Start     Dose/Rate Route Frequency Ordered Stop   05/07/21 1800  cefTRIAXone (ROCEPHIN) 1 g in sodium chloride 0.9 % 100 mL IVPB        1 g 200 mL/hr over 30 Minutes Intravenous Every 24 hours 05/07/21 1722        Culture/Microbiology No results found for: SDES, SPECREQUEST, CULT, REPTSTATUS  Other culture-see note  Unresulted Labs (From admission, onward)     Start     Ordered   05/11/21 0719   CBC  ONCE - STAT,   STAT       Question:  Specimen collection method  Answer:  Lab=Lab collect   05/11/21 0718          Data Reviewed: I have personally reviewed following labs and imaging studies CBC: Recent Labs  Lab 05/05/21 0301 05/06/21 0156 05/07/21 0203 05/07/21 1904 05/08/21 0225 05/09/21 0307  WBC 19.0* 16.4* 16.1*  --  15.9* 15.2*  HGB 15.1* 15.6* 16.3* 16.4* 15.9* 15.6*  HCT 43.3 45.5 47.3* 47.2* 46.6* 45.1  MCV 87.8 88.7 87.8  --  89.1 89.3  PLT 117* 168 188  --  203 109    Basic Metabolic Panel: Recent Labs  Lab 05/04/21 1214 05/05/21 0301 05/07/21 0203  NA 137 137 134*  K 4.4 4.1 3.8  CL 106 105 100  CO2 24 25 21*  GLUCOSE 208* 149* 162*  BUN 22 23 32*  CREATININE 0.94 0.79 0.77  CALCIUM 8.3* 8.4* 8.5*  MG 2.4 2.4  --     GFR: Estimated Creatinine Clearance: 89.4 mL/min (by C-G formula based on SCr of 0.77 mg/dL). Liver Function Tests: Recent Labs  Lab 05/05/21 0301  AST 30  ALT 27  ALKPHOS 76  BILITOT 0.7  PROT 6.3*  ALBUMIN 2.8*    No results for input(s): LIPASE, AMYLASE in the last 168 hours. No results for input(s): AMMONIA in the last 168 hours. Coagulation Profile: No results for input(s): INR, PROTIME in the last 168 hours. Cardiac Enzymes: Recent Labs  Lab 05/08/21 0225  CKTOTAL 48    BNP (last 3 results) No results for input(s): PROBNP in the last 8760 hours. HbA1C: No results for input(s): HGBA1C in the last 72 hours. CBG: Recent Labs  Lab 05/10/21 0604 05/10/21 1221 05/10/21 1558 05/10/21 2132 05/11/21 0604  GLUCAP 113* 121* 148* 133* 143*    Lipid Profile: No results for input(s): CHOL, HDL, LDLCALC, TRIG, CHOLHDL, LDLDIRECT in the last 72 hours. Thyroid Function Tests: No results for input(s): TSH, T4TOTAL, FREET4, T3FREE, THYROIDAB in the last 72 hours. Anemia Panel: No results for input(s): VITAMINB12, FOLATE, FERRITIN, TIBC, IRON, RETICCTPCT in the last 72 hours.  Sepsis Labs: Recent Labs  Lab  05/07/21 1107 05/08/21 0225 05/09/21 0307  PROCALCITON 0.13 0.13 0.16     Recent Results (from the past 240 hour(s))  Resp Panel by RT-PCR (Flu A&B, Covid) Urine, Clean Catch     Status: None   Collection Time: 05/02/21 12:06 AM  Specimen: Urine, Clean Catch; Nasopharyngeal(NP) swabs in vial transport medium  Result Value Ref Range Status   SARS Coronavirus 2 by RT PCR NEGATIVE NEGATIVE Final    Comment: (NOTE) SARS-CoV-2 target nucleic acids are NOT DETECTED.  The SARS-CoV-2 RNA is generally detectable in upper respiratory specimens during the acute phase of infection. The lowest concentration of SARS-CoV-2 viral copies this assay can detect is 138 copies/mL. A negative result does not preclude SARS-Cov-2 infection and should not be used as the sole basis for treatment or other patient management decisions. A negative result may occur with  improper specimen collection/handling, submission of specimen other than nasopharyngeal swab, presence of viral mutation(s) within the areas targeted by this assay, and inadequate number of viral copies(<138 copies/mL). A negative result must be combined with clinical observations, patient history, and epidemiological information. The expected result is Negative.  Fact Sheet for Patients:  EntrepreneurPulse.com.au  Fact Sheet for Healthcare Providers:  IncredibleEmployment.be  This test is no t yet approved or cleared by the [place] FDA and  has been authorized for detection and/or diagnosis of SARS-CoV-2 by FDA under an Emergency Use Authorization (EUA). This EUA will remain  in effect (meaning this test can be used) for the duration of the COVID-19 declaration under Section 564(b)(1) of the Act, 21 U.S.C.section 360bbb-3(b)(1), unless the authorization is terminated  or revoked sooner.       Influenza A by PCR NEGATIVE NEGATIVE Final   Influenza B by PCR NEGATIVE NEGATIVE Final     Comment: (NOTE) The Xpert Xpress SARS-CoV-2/FLU/RSV plus assay is intended as an aid in the diagnosis of influenza from Nasopharyngeal swab specimens and should not be used as a sole basis for treatment. Nasal washings and aspirates are unacceptable for Xpert Xpress SARS-CoV-2/FLU/RSV testing.  Fact Sheet for Patients: EntrepreneurPulse.com.au  Fact Sheet for Healthcare Providers: IncredibleEmployment.be  This test is not yet approved or cleared by the [place] FDA and has been authorized for detection and/or diagnosis of SARS-CoV-2 by FDA under an Emergency Use Authorization (EUA). This EUA will remain in effect (meaning this test can be used) for the duration of the COVID-19 declaration under Section 564(b)(1) of the Act, 21 U.S.C. section 360bbb-3(b)(1), unless the authorization is terminated or revoked.  Performed at [facility], [address] [place], [place] [address]   MRSA Next Gen by PCR, Nasal     Status: None   Collection Time: 05/02/21  9:11 PM   Specimen: Nasal Mucosa; Nasal Swab  Result Value Ref Range Status   MRSA by PCR Next Gen NOT DETECTED NOT DETECTED Final    Comment: (NOTE) The GeneXpert MRSA Assay (FDA approved for NASAL specimens only), is one component of a comprehensive MRSA colonization surveillance program. It is not intended to diagnose MRSA infection nor to guide or monitor treatment for MRSA infections. Test performance is not FDA approved in patients less than 69 years old. Performed at [facility], [address] [place], [place] [address]       Radiology Studies: No results found.   LOS: 6 days   Antonieta Pert, MD [facility]  05/11/2021, 7:18 AM

## 2021-05-11 NOTE — Plan of Care (Signed)
  Problem: Education: Goal: Knowledge of secondary prevention will improve (SELECT ALL) Outcome: Progressing   Problem: Ischemic Stroke/TIA Tissue Perfusion: Goal: Complications of ischemic stroke/TIA will be minimized Outcome: Progressing

## 2021-05-12 LAB — COMPREHENSIVE METABOLIC PANEL
ALT: 17 U/L (ref 0–44)
AST: 17 U/L (ref 15–41)
Albumin: 2.6 g/dL — ABNORMAL LOW (ref 3.5–5.0)
Alkaline Phosphatase: 79 U/L (ref 38–126)
Anion gap: 9 (ref 5–15)
BUN: 14 mg/dL (ref 8–23)
CO2: 23 mmol/L (ref 22–32)
Calcium: 8.3 mg/dL — ABNORMAL LOW (ref 8.9–10.3)
Chloride: 106 mmol/L (ref 98–111)
Creatinine, Ser: 0.66 mg/dL (ref 0.44–1.00)
GFR, Estimated: >60 mL/min (ref >60)
Glucose, Bld: 188 mg/dL — ABNORMAL HIGH (ref 70–99)
Potassium: 3.4 mmol/L — ABNORMAL LOW (ref 3.5–5.1)
Sodium: 138 mmol/L (ref 135–145)
Total Bilirubin: 0.9 mg/dL (ref 0.3–1.2)
Total Protein: 6.4 g/dL — ABNORMAL LOW (ref 6.5–8.1)

## 2021-05-12 LAB — RESP PANEL BY RT-PCR (FLU A&B, COVID) ARPGX2
Influenza A by PCR: NEGATIVE
Influenza B by PCR: NEGATIVE
SARS Coronavirus 2 by RT PCR: NEGATIVE

## 2021-05-12 LAB — GLUCOSE, CAPILLARY
Glucose-Capillary: 172 mg/dL — ABNORMAL HIGH (ref 70–99)
Glucose-Capillary: 174 mg/dL — ABNORMAL HIGH (ref 70–99)
Glucose-Capillary: 182 mg/dL — ABNORMAL HIGH (ref 70–99)
Glucose-Capillary: 139 mg/dL — ABNORMAL HIGH (ref 70–99)

## 2021-05-12 MED ORDER — OXYCODONE-ACETAMINOPHEN 5-325 MG PO TABS: 1.0000 | ORAL_TABLET | Freq: Four times a day (QID) | ORAL | 0 refills | Status: AC | PRN

## 2021-05-12 MED ORDER — RISPERIDONE 1 MG PO TBDP: 1.0000 mg | ORAL_TABLET | Freq: Two times a day (BID) | ORAL | Status: DC

## 2021-05-12 MED ORDER — VENLAFAXINE HCL ER 37.5 MG PO CP24: 112.5000 mg | ORAL_CAPSULE | Freq: Every day | ORAL | Status: DC

## 2021-05-12 MED ORDER — BASAGLAR KWIKPEN 100 UNIT/ML ~~LOC~~ SOPN
10.0000 [IU] | PEN_INJECTOR | Freq: Every day | SUBCUTANEOUS | Status: DC
Start: 2021-05-12 — End: 2023-09-30

## 2021-05-12 MED ORDER — METOPROLOL TARTRATE 25 MG PO TABS
12.5000 mg | ORAL_TABLET | Freq: Two times a day (BID) | ORAL | Status: DC
Start: 2021-05-12 — End: 2023-09-30

## 2021-05-12 MED ORDER — ZINC SULFATE 220 (50 ZN) MG PO CAPS: 220.0000 mg | ORAL_CAPSULE | Freq: Every day | ORAL | Status: DC

## 2021-05-12 NOTE — TOC Transition Note (Signed)
Transition of Care [facility]) - CM/SW Discharge Note   Patient Details  Name: Sheryl Henry MRN: [id number] Date of Birth: 1955-07-11  Transition of Care [facility] CM/SW Contact:  Carley Hammed, LCSWA Phone Number: 05/12/2021, 1:10 PM   Clinical Narrative:    Pt to be transported to [facility] via [place]. Nurse to call report to [phone]. Please notify son whenever pt is picked up by [facility].   Final next level of care: Skilled Nursing Facility Barriers to Discharge: Barriers Resolved   Patient Goals and CMS Choice Patient states their goals for this hospitalization and ongoing recovery are:: to get better CMS Medicare.gov Compare Post Acute Care list provided to:: Patient Choice offered to / list presented to : Patient  Discharge Placement              Patient chooses bed at: [facility] Patient to be transferred to facility by: [facility] Name of family member notified: Chrissie Noa Patient and family notified of of transfer: 05/12/21  Discharge Plan and Services     Post Acute Care Choice: Skilled Nursing Facility                               Social Determinants of Health (SDOH) Interventions     Readmission Risk Interventions No flowsheet data found.

## 2021-05-12 NOTE — Progress Notes (Signed)
Pts son Chrissie Noa called and informed that pt has left for facility. Attempted to call report x 2 no answer.

## 2021-05-12 NOTE — Progress Notes (Signed)
Dayshift RN attempted report with receiving facility two times. Will report to Nightshift RN to attempt.

## 2021-05-12 NOTE — Progress Notes (Signed)
Occupational Therapy Treatment Patient Details Name: Sheryl Henry MRN: [id number] DOB: [birth year] Today's Date: 05/12/2021   History of present illness Sheryl Henry  is a 65 y.o. 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   OT comments  Patient seen as co-treat to address functional transfer and patient/therapist safety. Patient performed SPT to [facility] and participated in clothing management and hygiene while standing at RW with assistance of 2 due to assistance needed with balance. Patient performed grooming standing at sink with patient able to let go of walker to wash both hands. Patient is making good progress with OT treatment with increased participation in self care tasks. Acute OT to continue to follow.    Recommendations for follow up therapy are one component of a multi-disciplinary discharge planning process, led by the attending physician.  Recommendations may be updated based on patient status, additional functional criteria and insurance authorization.    Follow Up Recommendations  Skilled nursing-short term rehab (<3 hours/day)    Assistance Recommended at Discharge Frequent or constant Supervision/Assistance  Equipment Recommendations  BSC/3in1    Recommendations for Other Services      Precautions / Restrictions Precautions Precautions: Fall Restrictions Weight Bearing Restrictions: No       Mobility Bed Mobility Overal bed mobility: Needs Assistance Bed Mobility: Supine to Sit     Supine to sit: Min guard     General bed mobility comments: no physical assist given, use of bed rails and increased time    Transfers Overall transfer level: Needs assistance Equipment used: Rolling walker (2 wheels) Transfers: Sit to/from Stand;Bed to chair/wheelchair/BSC Sit to Stand: Mod assist;+2 physical assistance;+2  safety/equipment   Step pivot transfers: Mod assist;+2 safety/equipment       General transfer comment: continues to have fear of falling and assistance of 2 for power up and transfers     Balance Overall balance assessment: Needs assistance Sitting-balance support: Feet supported;No upper extremity supported Sitting balance-Leahy Scale: Fair Sitting balance - Comments: seated EOB   Standing balance support: Bilateral upper extremity supported;Reliant on assistive device for balance;During functional activity Standing balance-Leahy Scale: Poor Standing balance comment: dependent on RW or sink for assist                           ADL either performed or assessed with clinical judgement   ADL Overall ADL's : Needs assistance/impaired     Grooming: Wash/dry hands;Wash/dry face;Minimal assistance;Standing Grooming Details (indicate cue type and reason): assistance for balance while standing at sink             Lower Body Dressing: Maximal assistance;Moderate assistance;+2 for physical assistance Lower Body Dressing Details (indicate cue type and reason): patient required max assist to doff and donn socks and mod/max assist with brief with assistance of another for balance Toilet Transfer: Moderate assistance;+2 for physical assistance Toilet Transfer Details (indicate cue type and reason): mod assist x2 with RW to transfer from EOB to [facility] Toileting- Clothing Manipulation and Hygiene: Moderate assistance;+2 for safety/equipment Toileting - Clothing Manipulation Details (indicate cue type and reason): patient participated in toilet hygiene and clothing management with assistance of another for balance       General ADL Comments: assistance of 2 for all standing ADL activities due to assistance needed with balance    Extremity/Trunk Assessment  Vision       Perception     Praxis      Cognition Arousal/Alertness: Awake/alert Behavior During  Therapy: WFL for tasks assessed/performed Overall Cognitive Status: Within Functional Limits for tasks assessed                                 General Comments: patient with louder better to understand speech          Exercises Exercises: Other exercises Other Exercises Other Exercises: STS x4   Shoulder Instructions       General Comments patient had complaints of dizziness after standing for extended time    Pertinent Vitals/ Pain       Pain Assessment: Faces Faces Pain Scale: Hurts little more Pain Location: L foot/ankle, pt refuses to wt bear on LLE Pain Descriptors / Indicators: Discomfort;Grimacing;Guarding;Sharp Pain Intervention(s): Repositioned;Monitored during session  Home Living                                          Prior Functioning/Environment              Frequency  Min 2X/week        Progress Toward Goals  OT Goals(current goals can now be found in the care plan section)  Progress towards OT goals: Progressing toward goals  Acute Rehab OT Goals Patient Stated Goal: get better OT Goal Formulation: With patient Time For Goal Achievement: 05/19/21 Potential to Achieve Goals: Fair ADL Goals Pt Will Perform Grooming: with min guard assist;standing;with adaptive equipment Pt Will Perform Lower Body Bathing: with modified independence;sitting/lateral leans;with adaptive equipment Pt Will Perform Lower Body Dressing: with modified independence;with adaptive equipment;sitting/lateral leans Pt Will Transfer to Toilet: with min guard assist;stand pivot transfer Pt/caregiver will Perform Home Exercise Program: Increased strength;Both right and left upper extremity;With Supervision  Plan Discharge plan remains appropriate    Co-evaluation    PT/OT/SLP Co-Evaluation/Treatment: Yes Reason for Co-Treatment: Necessary to address cognition/behavior during functional activity;For patient/therapist safety;To address  functional/ADL transfers PT goals addressed during session: Mobility/safety with mobility;Balance;Proper use of DME;Strengthening/ROM OT goals addressed during session: ADL's and self-care      AM-PAC OT "6 Clicks" Daily Activity     Outcome Measure   Help from another person eating meals?: None Help from another person taking care of personal grooming?: A Little Help from another person toileting, which includes using toliet, bedpan, or urinal?: A Lot Help from another person bathing (including washing, rinsing, drying)?: A Lot Help from another person to put on and taking off regular upper body clothing?: A Little Help from another person to put on and taking off regular lower body clothing?: A Lot 6 Click Score: 16    End of Session Equipment Utilized During Treatment: Rolling walker (2 wheels);Gait belt  OT Visit Diagnosis: Unsteadiness on feet (R26.81);Other abnormalities of gait and mobility (R26.89);Muscle weakness (generalized) (M62.81);Ataxia, unspecified (R27.0)   Activity Tolerance Patient tolerated treatment well   Patient Left in chair;with call bell/phone within reach;with chair alarm set   Nurse Communication Mobility status;Need for lift equipment        Time: 1202-1242 OT Time Calculation (min): 40 min  Charges: OT General Charges $OT Visit: 1 Visit OT Treatments $Self Care/Home Management : 23-37 mins  Alfonse Flavors, OTA Acute Rehabilitation Services  Pager [phone] Office [phone]  Dewain Penning 05/12/2021, 2:49 PM

## 2021-05-12 NOTE — Progress Notes (Signed)
PROGRESS NOTE    Sheryl Henry  DEY:[id number] DOB: 02-11-1956 DOA: 05/01/2021 PCP: [name], NP   Chief Complaint  Patient presents with   Hallucinations    Auditory hallucination started after halloween.  "Felt sound-wave in head about one hour away.  Pt thinks neighbors are watching her with cameras in her house.     Brief Narrative/Hospital Course:  Sheryl Henry, 65 y.o. female with PMH of  T2DM, HTN presented to the ED with ringing in her head-1 day prior to presentation, also thinking that her neighbors were watching her with cameras in her house.She reports she heard a high-volume sound in her head and felt like it made her whole head vibrate.  After that she had barely any sleep all night.  Symptoms seem to resolve and then the day of presentation it happened again.  Around lunchtime she had an intense feeling in her head.  She does not characterize it as pain, but cannot describe the sensation.  She laid down to take a nap at 2:00.  When she woke up she had the high-volume sound in her head again.  She reports that it happened so fast she cannot describe the characteristics of it.  He goes "through her head" but she cannot describe where it starts and finishes.  She says that the feeling that she has after that is like her brain needs a repeat.  It is an intense feeling and she does not know how to make it go away.  She knows for sure the soundwave brings it on.  The feeling that her brain needs to remove it is in both of her temples.  She again says that its not the pain.  Per patient, this soundwave is her main concern. Son had told the ER that she has had this before and she has been off some of her psych medications.  They were planning to discharge her home with follow-up to psychiatry, patient then announced that she has not been able to walk all day. She reports she feels like she is staggering.  She does not feel dizzy or like the room is spinning.   Her LE weakness  worsened after receiving the flu and covid vaccines recently. Patient was admitted.Seen by psychiatry, neurology-recommend MRI cervical spine-that was negative for acute significant findings. She had episode of rectal bleeding- seen by GI-since no recurrence of rectal bleeding noted holding off on EGD and colonoscopy at this time will need outpatient screening colonoscopy. Seen by PT OT and has advised skilled nursing facility.  Subjective: Resting comfortably this morning.  Alert awake oriented.   Yesterday patient's son reported hallucination/behavior changes.  Psych has been notified.    Assessment & Plan:  LE weakness abrupt onset: MRI cervical spine no acute significant finding. S/P lumbar puncture, elevated protein level likely nonspecific-seen by neurology unlikely GBS given sudden onset of symptoms and intact reflex.  Could be multifactorial deconditioning, medication related possible neuropathy. CK is normal.  Continue PT OT for placement.  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  Well-controlled on Glargine 10 units and SSI. Recent Labs  Lab 05/11/21 1158 05/11/21 1635 05/11/21 2137 05/12/21 0614 05/12/21 0801  GLUCAP 143* 162* 165* 172* 174*   Rectal bleeding episode 11/16 evening: Discontinued heparin, placed on PPI-seen by GI-since no recurrence of rectal bleeding noted holding off on EGD and colonoscopy at this time will need outpatient screening colonoscopy.  AL amyloidosis cutaneous: Followed by Dr. Elnoria Howard at  [facility] not on systemic treatment previously on thalidomide Revlimid melphalan, was discussed with Dr. Arbie Cookey recommended ordering multiple myeloma panel along with light chains,-multiple myeloma panel pending, kappa/lambda light chain ratio 1.1 normal although kappa free light chain elevated at one 27.3, patient will need outpatient follow-up.  Acute hypoxia chest x-ray with atelectasis no chest pain or shortness of breath.  Mildly  abnormal D-dimer no lower extremity swelling chest pain low suspicion for PTE.  Weaned off oxygen, continue I-S and ambulation  UTI- with leukocytosis- patient is afebrile-Given worsening WBC count repeated UA and chest x-ray.  UA concerning for UTI placed on empiric antibiotics. Patient is incontinent.  Repeat WBC count in a.m. Recent Labs  Lab 05/08/21 0225 05/09/21 0307 05/11/21 0750  HGB 15.9* 15.6* 15.7*  HCT 46.6* 45.1 45.6  WBC 15.9* 15.2* 13.3*  PLT 203 211 238     Hypothyroidism On levothyroxine  Migraine headache some headache this morning, S/P migraine cocktail on 11/11.  Continue with symptomatic management.  Psych issues/auditary hallucination anxiety/depression on Risperdal venlafaxine-dose adjusted by psychiatry.  Family concerned about patient's hallucination as well as behavior changes yesterday evening.  Psych has been notified, ordered for psych clearance prior to discharge to skilled nursing facility.   Hypertension: Stable on metoprolol..  Class II Obesity:Patient's Body mass index is 36.91 kg/m. : Will benefit with PCP follow-up, weight loss  healthy lifestyle and outpatient sleep evaluation.  DVT prophylaxis: Place and maintain sequential compression device Start: 05/03/21 0714 SCD's Start: 05/02/21 0238 Code Status:   Code Status: Full Code Family Communication: plan of care discussed with patient at bedside. Status is: Inpatient Remains inpatient appropriate because: For ongoing management of bilateral lower extremity weakness, need for SNF Disposition: SNF once available and cleared by psychiatry.  Objective: Vitals last 24 hrs: Vitals:   05/11/21 1802 05/11/21 1934 05/11/21 2321 05/12/21 0805  BP: 136/81 124/64 120/63 (!) 141/64  Pulse: (!) 109 98 84 98  Resp: _0 Temp: 97.7 F (36.5 C) 98.6 F (37 C) 98.5 F (36.9 C) 98.4 F (36.9 C)  TempSrc: Oral Oral Oral Oral  SpO2: 94% 93% 93% 93%  Weight:      Height:       Weight change:    Intake/Output Summary (Last 24 hours) at 05/12/2021 1008 Last data filed at 05/12/2021 0400 Gross per 24 hour  Intake 360 ml  Output 600 ml  Net -240 ml    Net IO Since Admission: 663.6 mL [05/12/21 1008]   Physical Examination: General exam: AAOx conversant, not in distress. HEENT:Oral mucosa moist, Ear/Nose WNL grossly, dentition normal. Respiratory system: bilaterally clear breath sounds, no use of accessory muscle Cardiovascular system: S1 & S2 +, No JVD,. Gastrointestinal system: Abdomen soft,NT,ND, BS+ Nervous System:Alert, awake, moving extremities and grossly nonfocal Extremities: No edema, distal peripheral pulses palpable.  Skin: No rashes,no icterus. MSK: Normal muscle bulk,tone, power   Medications reviewed: Scheduled Meds:  dicyclomine  20 mg Oral TID AC & HS   insulin aspart  0-15 Units Subcutaneous TID WC   insulin aspart  0-5 Units Subcutaneous QHS   insulin aspart  5 Units Subcutaneous TID WC   insulin glargine-yfgn  10 Units Subcutaneous Daily   levothyroxine  50 mcg Oral Q0600   metoprolol tartrate  12.5 mg Oral BID   pantoprazole  40 mg Oral BID   risperiDONE  1 mg Oral BID   venlafaxine XR  112.5 mg Oral Daily   zinc sulfate  220 mg  Oral Daily   Continuous Infusions:  cefTRIAXone (ROCEPHIN)  IV 1 g (05/11/21 1810)   lactated ringers Stopped (05/12/21 0136)    Diet Order             Diet heart healthy/carb modified Room service appropriate? Yes; Fluid consistency: Thin  Diet effective now                  Weight change:   Wt Readings from Last 3 Encounters:  05/02/21 106.9 kg     Consultants:see note  Procedures:see note Antimicrobials: Anti-infectives (From admission, onward)    Start     Dose/Rate Route Frequency Ordered Stop   05/07/21 1800  cefTRIAXone (ROCEPHIN) 1 g in sodium chloride 0.9 % 100 mL IVPB        1 g 200 mL/hr over 30 Minutes Intravenous Every 24 hours 05/07/21 1722        Culture/Microbiology No results  found for: SDES, SPECREQUEST, CULT, REPTSTATUS  Other culture-see note  Unresulted Labs (From admission, onward)     Start     Ordered   05/12/21 0844  Comprehensive metabolic panel  ONCE - STAT,   STAT       Question:  Specimen collection method  Answer:  Lab=Lab collect   05/12/21 0843          Data Reviewed: I have personally reviewed following labs and imaging studies CBC: Recent Labs  Lab 05/06/21 0156 05/07/21 0203 05/07/21 1904 05/08/21 0225 05/09/21 0307 05/11/21 0750  WBC 16.4* 16.1*  --  15.9* 15.2* 13.3*  HGB 15.6* 16.3* 16.4* 15.9* 15.6* 15.7*  HCT 45.5 47.3* 47.2* 46.6* 45.1 45.6  MCV 88.7 87.8  --  89.1 89.3 89.2  PLT 168 188  --  203 211 295    Basic Metabolic Panel: Recent Labs  Lab 05/07/21 0203  NA 134*  K 3.8  CL 100  CO2 21*  GLUCOSE 162*  BUN 32*  CREATININE 0.77  CALCIUM 8.5*    GFR: Estimated Creatinine Clearance: 89.4 mL/min (by C-G formula based on SCr of 0.77 mg/dL). Liver Function Tests: No results for input(s): AST, ALT, ALKPHOS, BILITOT, PROT, ALBUMIN in the last 168 hours.  No results for input(s): LIPASE, AMYLASE in the last 168 hours. No results for input(s): AMMONIA in the last 168 hours. Coagulation Profile: No results for input(s): INR, PROTIME in the last 168 hours. Cardiac Enzymes: Recent Labs  Lab 05/08/21 0225  CKTOTAL 48    BNP (last 3 results) No results for input(s): PROBNP in the last 8760 hours. HbA1C: No results for input(s): HGBA1C in the last 72 hours. CBG: Recent Labs  Lab 05/11/21 1158 05/11/21 1635 05/11/21 2137 05/12/21 0614 05/12/21 0801  GLUCAP 143* 162* 165* 172* 174*    Lipid Profile: No results for input(s): CHOL, HDL, LDLCALC, TRIG, CHOLHDL, LDLDIRECT in the last 72 hours. Thyroid Function Tests: No results for input(s): TSH, T4TOTAL, FREET4, T3FREE, THYROIDAB in the last 72 hours. Anemia Panel: No results for input(s): VITAMINB12, FOLATE, FERRITIN, TIBC, IRON, RETICCTPCT in the  last 72 hours.  Sepsis Labs: Recent Labs  Lab 05/07/21 1107 05/08/21 0225 05/09/21 0307  PROCALCITON 0.13 0.13 0.16     Recent Results (from the past 240 hour(s))  MRSA Next Gen by PCR, Nasal     Status: None   Collection Time: 05/02/21  9:11 PM   Specimen: Nasal Mucosa; Nasal Swab  Result Value Ref Range Status   MRSA by PCR Next Gen NOT DETECTED NOT  DETECTED Final    Comment: (NOTE) The GeneXpert MRSA Assay (FDA approved for NASAL specimens only), is one component of a comprehensive MRSA colonization surveillance program. It is not intended to diagnose MRSA infection nor to guide or monitor treatment for MRSA infections. Test performance is not FDA approved in patients less than 74 years old. Performed at [facility], [address] [place], [place] [address]       Radiology Studies: No results found.   LOS: 7 days   Antonieta Pert, MD [facility]  05/12/2021, 10:08 AM

## 2021-05-12 NOTE — Progress Notes (Addendum)
Physical Therapy Treatment Patient Details Name: Sheryl Henry MRN: [id number] DOB: [birth year] Today's Date: 05/12/2021   History of Present Illness Sheryl Henry  is a 65 y.o. 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    PT Comments    Pt received in supine and agreeable to therapy session. Pt verbalized fear of falls which limits her mobility and effort, she requires up to modA+2 for step pivot transfer. Emphasis on safe hand placement with RW, upright posture once standing, with heavy cues and encouragement needed. Pt c/o dizziness during standing trials and will sit without warning, therefore utilized close chair follow for patient safety. Continue to recommend SNF level therapies upon DC. Pt continues to benefit from PT services to progress toward functional mobility goals.      Recommendations for follow up therapy are one component of a multi-disciplinary discharge planning process, led by the attending physician.  Recommendations may be updated based on patient status, additional functional criteria and insurance authorization.  Follow Up Recommendations  Skilled nursing-short term rehab (<3 hours/day)     Assistance Recommended at Discharge Frequent or constant Supervision/Assistance  Equipment Recommendations  None recommended by PT    Recommendations for Other Services       Precautions / Restrictions Precautions Precautions: Fall Restrictions Weight Bearing Restrictions: No     Mobility  Bed Mobility Overal bed mobility: Needs Assistance Bed Mobility: Supine to Sit    Supine to sit: Min guard    General bed mobility comments: no physical assist given, use of bed rails and increased time    Transfers Overall transfer level: Needs assistance Equipment used: Rolling walker (2 wheels) Transfers: Sit to/from Stand;Bed to  chair/wheelchair/BSC Sit to Stand: Mod assist;+2 physical assistance;+2 safety/equipment    Step pivot transfers: Mod assist;+2 safety/equipment    General transfer comment: modA +2 to power up and steady upon standing, completed x4 throughout session. Pt has fear of falling and needs chair/bed/bsc behind her when standing and pivoting    Ambulation/Gait    General Gait Details: Small steps pivoting to bsc, pt able to bear weight on BLE this session, no overt LOB        Balance Overall balance assessment: Needs assistance Sitting-balance support: Feet supported;No upper extremity supported Sitting balance-Leahy Scale: Fair Sitting balance - Comments: seated EOB   Standing balance support: Bilateral upper extremity supported;Reliant on assistive device for balance;During functional activity Standing balance-Leahy Scale: Poor Standing balance comment: dependent on RW or sink for assist, posterior lean in stance      Cognition Arousal/Alertness: Awake/alert Behavior During Therapy: WFL for tasks assessed/performed Overall Cognitive Status: Within Functional Limits for tasks assessed   General Comments: pt slow to respond to some questions, but generally following cues when given increased cues and time.       Exercises Other Exercises Other Exercises: STS x4    General Comments General comments (skin integrity, edema, etc.): Pt c/o dizziness upon each standing trial, chair/bsc kept close behind as pt will sit without warning. Pt provided briefs for urinary incontinence, noted incontinence with each transfer and purewick/briefs donned throughout      Pertinent Vitals/Pain Pain Assessment: Faces Faces Pain Scale: Hurts little more Pain Location: L foot/ankle, pt self-limiting weight bearing on LLE Pain Descriptors / Indicators: Discomfort;Grimacing;Guarding;Sharp     PT Goals (current goals can now be found in the care plan section) Acute Rehab  PT Goals Patient Stated  Goal: Return home PT Goal Formulation: With patient Time For Goal Achievement: 05/16/21 Progress towards PT goals: Progressing toward goals    Frequency    Min 3X/week      PT Plan Current plan remains appropriate    Co-evaluation PT/OT/SLP Co-Evaluation/Treatment: Yes Reason for Co-Treatment: Necessary to address cognition/behavior during functional activity;For patient/therapist safety;To address functional/ADL transfers PT goals addressed during session: Mobility/safety with mobility;Balance;Proper use of DME;Strengthening/ROM        AM-PAC PT "6 Clicks" Mobility   Outcome Measure  Help needed turning from your back to your side while in a flat bed without using bedrails?: A Little Help needed moving from lying on your back to sitting on the side of a flat bed without using bedrails?: A Lot (mod+ cues for all below) Help needed moving to and from a bed to a chair (including a wheelchair)?: A Lot Help needed standing up from a chair using your arms (e.g., wheelchair or bedside chair)?: A Lot Help needed to walk in hospital room?: Total Help needed climbing 3-5 steps with a railing? : Total 6 Click Score: 11    End of Session Equipment Utilized During Treatment: Gait belt Activity Tolerance: Patient limited by fatigue Patient left: with call bell/phone within reach;with chair alarm set;in chair Nurse Communication: Mobility status;Other (comment) (Replaced purewick and utilized mesh briefs with pad, but unable to contain urine; placed full briefs over mesh briefs) PT Visit Diagnosis: Unsteadiness on feet (R26.81);Other abnormalities of gait and mobility (R26.89);Muscle weakness (generalized) (M62.81);Difficulty in walking, not elsewhere classified (R26.2);Pain Pain - Right/Left: Left Pain - part of body: Ankle and joints of foot     Time: 1202-1242 PT Time Calculation (min) (ACUTE ONLY): 40 min  Charges:  $Therapeutic Activity: 8-22 mins                      Harland German, Student PTA CI: Carly P., PTA  Carly M Poff 05/12/2021, 1:21 PM

## 2021-05-12 NOTE — Consult Note (Signed)
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 female admitted medically for 05/01/2021  4:16 PM for ataxia/weakness. 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 Psychiatry was consulted for auditory hallucinations and concern for functional bilateral lower extremity weakness by Dr. Rito Ehrlich.      Psychiatry was asked to reassess patient after concern for bizarre behavior and endorsing bizarre feelings yesterday 11/20.  On assessment today patient appears significantly improved from prior psychiatric assessments.  Patient denied any symptoms that were reported yesterday.  Patient appeared to endorse better insight than on prior psychiatric exams.  Patient judgment also appeared improved as patient appeared more willing and open to receiving recommendations.  Patient does continue to endorse unsubstantiated paranoia towards her neighbors; however, patient appeared to have minimal insight and acknowledge that some of this paranoia did not make sense.  Despite this patient was able to clarify that this paranoid does not transfer over to other places and she continues to feel comfortable going to Sheryl SNF.  It is not certain what because concerning presentation yesterday, however patient does not appear to endorse similar symptoms to day and staff who have been monitoring patient more frequently also deny concern for behavior.  ## Safety and Observation Level:  - Based  on my clinical evaluation, I estimate the patient to be at low risk of self harm in the current setting - At this time, we recommend Sheryl routine level of observation. This decision is based on my review of the chart including patient's history and current presentation, interview of the patient, mental status examination, and consideration of suicide risk including evaluating suicidal ideation, plan, intent, suicidal or self-harm behaviors, risk factors, and protective factors. This judgment is based on our ability to directly address suicide risk, implement suicide prevention strategies and develop Sheryl safety plan while the patient is in the clinical setting. Please contact our team if there is Sheryl concern that risk level has changed.     ## Medications:  -- i venlafaxine ER 112.5 mg -- c risperidone 1 mg BID (new med, no indication to increase in near future)   ## Medical Decision Making Capacity:  Not formally assessed   ## Further Work-up:  -- serum B12 with MMA, folate - pt on vitamin C supplement and level low yield -- either zinc level or empirically replete    ## Disposition:  -- per primary team   ## Behavioral / Environmental:  -- Utilize compassion and acknowledge the patient's experiences while setting clear and realistic expectations for care.   ##Legal Status     Thank you for this consult request. Recommendations have been communicated to the primary team.  We will sign off at this time; pt is medically stable and has been accepted to at least 1 SNF. Please reconsult if pt here in 1-2 weeks or if there is any decline/new questions for psych team.   Total Time spent with patient: 15 minutes  Subjective:   Sheryl Henry  Sheryl Henry is Sheryl 65 y.o. female patient admitted with weakness and ataxia.  Had overall unconvincing work-up, with multiple marker slightly abnormal..  HPI: On assessment today patient is alert and oriented.  Patient appears to speak in Sheryl normal tone and is very clear and  coherent.  Patient actively denies SI, HI and AVH.  Patient denies having any feelings of Sheryl "sound wave" in her head.  Patient did endorse that she is worried about her neighbors moving things around in her home.  Patient endorsed that she is not sure how these neighbors would get into her home and endorses that it is only some neighbors who she is worried about.  Patient denies recalling specific names for which neighbor she is more concerned about.  Patient reports that prior to coming to the hospital she did not leave her home much; however, patient reports that she feels that she be more willing to go out and accomplish basic life task like grocery shopping or speaking with others.  Patient endorses that she feels open to talking to others when she gets to her SNF.  Patient reports that she continues to sleep well and that her appetite is slowly coming back.  Patient reports that overall she feels "better."   Past Medical History:  Past Medical History:  Diagnosis Date   Cutaneous amyloidosis ([place])    Diabetes mellitus without complication ([place])    Hypertension     Past Surgical History:  Procedure Laterality Date   BACK SURGERY     BREAST SURGERY     MANDIBLE SURGERY     Family History: History reviewed. No pertinent family history.  Social History:  Social History   Substance and Sexual Activity  Alcohol Use Not Currently     Social History   Substance and Sexual Activity  Drug Use Never    Social History   Socioeconomic History   Marital status: Divorced    Spouse name: Not on file   Number of children: Not on file   Years of education: Not on file   Highest education level: Not on file  Occupational History   Not on file  Tobacco Use   Smoking status: Never   Smokeless tobacco: Never  Vaping Use   Vaping Use: Never used  Substance and Sexual Activity   Alcohol use: Not Currently   Drug use: Never   Sexual activity: Not Currently  Other Topics Concern   Not on  file  Social History Narrative   Not on file   Social Determinants of Health   Financial Resource Strain: Not on file  Food Insecurity: Not on file  Transportation Needs: Not on file  Physical Activity: Not on file  Stress: Not on file  Social Connections: Not on file   Additional Social History:    Allergies:   Allergies  Allergen Reactions   Ivp Dye [Iodinated Diagnostic Agents] Swelling    Labs:  Results for orders placed or performed during the hospital encounter of 05/01/21 (from the past 48 hour(s))  Glucose, capillary     Status: Abnormal   Collection Time: 05/10/21  9:32 PM  Result Value Ref Range   Glucose-Capillary 133 (H) 70 - 99 mg/dL    Comment: Glucose reference range applies only to samples taken after fasting for at least 8 hours.  Glucose, capillary     Status: Abnormal   Collection Time: 05/11/21  6:04 AM  Result Value Ref Range   Glucose-Capillary 143 (H) 70 -  99 mg/dL    Comment: Glucose reference range applies only to samples taken after fasting for at least 8 hours.  CBC     Status: Abnormal   Collection Time: 05/11/21  7:50 AM  Result Value Ref Range   WBC 13.3 (H) 4.0 - 10.5 K/uL   RBC 5.11 3.87 - 5.11 MIL/uL   Hemoglobin 15.7 (H) 12.0 - 15.0 g/dL   HCT 45.6 36.0 - 46.0 %   MCV 89.2 80.0 - 100.0 fL   MCH 30.7 26.0 - 34.0 pg   MCHC 34.4 30.0 - 36.0 g/dL   RDW 12.7 11.5 - 15.5 %   Platelets 238 150 - 400 K/uL   nRBC 0.0 0.0 - 0.2 %    Comment: Performed at [facility], [place] [address] [place], [place] [address]  Glucose, capillary     Status: Abnormal   Collection Time: 05/11/21 11:58 AM  Result Value Ref Range   Glucose-Capillary 143 (H) 70 - 99 mg/dL    Comment: Glucose reference range applies only to samples taken after fasting for at least 8 hours.   Comment 1 Notify RN    Comment 2 Document in Chart   Glucose, capillary     Status: Abnormal   Collection Time: 05/11/21  4:35 PM  Result Value Ref Range   Glucose-Capillary 162  (H) 70 - 99 mg/dL    Comment: Glucose reference range applies only to samples taken after fasting for at least 8 hours.   Comment 1 Notify RN    Comment 2 Document in Chart   Glucose, capillary     Status: Abnormal   Collection Time: 05/11/21  9:37 PM  Result Value Ref Range   Glucose-Capillary 165 (H) 70 - 99 mg/dL    Comment: Glucose reference range applies only to samples taken after fasting for at least 8 hours.  Glucose, capillary     Status: Abnormal   Collection Time: 05/12/21  6:14 AM  Result Value Ref Range   Glucose-Capillary 172 (H) 70 - 99 mg/dL    Comment: Glucose reference range applies only to samples taken after fasting for at least 8 hours.  Glucose, capillary     Status: Abnormal   Collection Time: 05/12/21  8:01 AM  Result Value Ref Range   Glucose-Capillary 174 (H) 70 - 99 mg/dL    Comment: Glucose reference range applies only to samples taken after fasting for at least 8 hours.  Comprehensive metabolic panel     Status: Abnormal   Collection Time: 05/12/21  9:28 AM  Result Value Ref Range   Sodium 138 135 - 145 mmol/L   Potassium 3.4 (L) 3.5 - 5.1 mmol/L   Chloride 106 98 - 111 mmol/L   CO2 23 22 - 32 mmol/L   Glucose, Bld 188 (H) 70 - 99 mg/dL    Comment: Glucose reference range applies only to samples taken after fasting for at least 8 hours.   BUN 14 8 - 23 mg/dL   Creatinine, Ser 0.66 0.44 - 1.00 mg/dL   Calcium 8.3 (L) 8.9 - 10.3 mg/dL   Total Protein 6.4 (L) 6.5 - 8.1 g/dL   Albumin 2.6 (L) 3.5 - 5.0 g/dL   AST 17 15 - 41 U/L   ALT 17 0 - 44 U/L   Alkaline Phosphatase 79 38 - 126 U/L   Total Bilirubin 0.9 0.3 - 1.2 mg/dL   GFR, Estimated >60 >60 mL/min    Comment: (NOTE) Calculated using the  CKD-EPI Creatinine Equation (2021)    Anion gap 9 5 - 15    Comment: Performed at [facility], [place] [address], [place], [place] [address]  Glucose, capillary     Status: Abnormal   Collection Time: 05/12/21  1:13 PM  Result Value Ref Range    Glucose-Capillary 182 (H) 70 - 99 mg/dL    Comment: Glucose reference range applies only to samples taken after fasting for at least 8 hours.    Current Facility-Administered Medications  Medication Dose Route Frequency Provider Last Rate Last Admin   acetaminophen (TYLENOL) tablet 650 mg  650 mg Oral Q4H PRN Zierle-Ghosh, Asia B, DO   650 mg at 05/09/21 0558   Or   acetaminophen (TYLENOL) 160 MG/5ML solution 650 mg  650 mg Per Tube Q4H PRN Zierle-Ghosh, Asia B, DO       Or   acetaminophen (TYLENOL) suppository 650 mg  650 mg Rectal Q4H PRN Zierle-Ghosh, Asia B, DO       cefTRIAXone (ROCEPHIN) 1 g in sodium chloride 0.9 % 100 mL IVPB  1 g Intravenous Q24H Kc, Ramesh, MD 200 mL/hr at 05/11/21 1810 1 g at 05/11/21 1810   dicyclomine (BENTYL) tablet 20 mg  20 mg Oral TID AC & HS Johnson, Clanford L, MD   20 mg at 05/12/21 1407   insulin aspart (novoLOG) injection 0-15 Units  0-15 Units Subcutaneous TID WC Zierle-Ghosh, Asia B, DO   3 Units at 05/12/21 1407   insulin aspart (novoLOG) injection 0-5 Units  0-5 Units Subcutaneous QHS Zierle-Ghosh, Asia B, DO   2 Units at 05/04/21 2218   insulin aspart (novoLOG) injection 5 Units  5 Units Subcutaneous TID WC Johnson, Clanford L, MD   5 Units at 05/12/21 1406   insulin glargine-yfgn (SEMGLEE) injection 10 Units  10 Units Subcutaneous Daily Wynetta Emery, Clanford L, MD   10 Units at 05/12/21 1032   lactated ringers infusion   Intravenous Continuous Kc, Ramesh, MD   Stopped at 05/12/21 0136   levothyroxine (SYNTHROID) tablet 50 mcg  50 mcg Oral Q0600 Wynetta Emery, Clanford L, MD   50 mcg at 05/12/21 [id number]   metoprolol tartrate (LOPRESSOR) tablet 12.5 mg  12.5 mg Oral BID Bonnielee Haff, MD   12.5 mg at 05/12/21 1031   oxyCODONE-acetaminophen (PERCOCET/ROXICET) 5-325 MG per tablet 1 tablet  1 tablet Oral QID PRN Wynetta Emery, Clanford L, MD   1 tablet at 05/12/21 0830   pantoprazole (PROTONIX) EC tablet 40 mg  40 mg Oral BID Kc, Maren Beach, MD   40 mg at 05/12/21 1031    phenol (CHLORASEPTIC) mouth spray 1 spray  1 spray Mouth/Throat PRN Bonnielee Haff, MD       risperiDONE (RISPERDAL M-TABS) disintegrating tablet 1 mg  1 mg Oral BID Johnson, Clanford L, MD   1 mg at 05/12/21 1031   senna-docusate (Senokot-S) tablet 1 tablet  1 tablet Oral QHS PRN Zierle-Ghosh, Asia B, DO       venlafaxine XR (EFFEXOR-XR) 24 hr capsule 112.5 mg  112.5 mg Oral Daily Cinderella, Margaret Sheryl   112.5 mg at 05/12/21 1031   zinc sulfate capsule 220 mg  220 mg Oral Daily Damita Dunnings B, MD   220 mg at 05/12/21 1031     Psychiatric Specialty Exam:  Presentation  General Appearance: Appropriate for Environment  Eye Contact:Good  Speech:Clear and Coherent  Speech Volume:Normal  Handedness:No data recorded  Mood and Affect  Mood:-- ("better.")  Affect:Restricted   Thought Process  Thought Processes:Coherent  Descriptions of Associations:Intact  Orientation:Full (Time, Place and Person)  Thought Content:Paranoid Ideation (overall logical but sill paranoid towrads neighbors, but does not endorse concern about anything else)  History of Schizophrenia/Schizoaffective disorder:No  Duration of Psychotic Symptoms:Less than six months  Hallucinations:Hallucinations: None  Ideas of Reference:Paranoia  Suicidal Thoughts:Suicidal Thoughts: No  Homicidal Thoughts:Homicidal Thoughts: No   Sensorium  Memory:Immediate Fair; Recent Fair; Remote Fair  Judgment:-- (Improving)  Insight:Shallow   Executive Functions  Concentration:Good  Attention Span:Good  Industry of Knowledge:Good  Language:Good   Psychomotor Activity  Psychomotor Activity:Psychomotor Activity: Decreased   Assets  Assets:Housing; Data processing manager; Resilience   Sleep  Sleep:Sleep: Good   Physical Exam: Physical Exam HENT:     Head: Normocephalic and atraumatic.  Eyes:     Extraocular Movements: Extraocular movements intact.  Neurological:     Mental Status: She is  alert.   Review of Systems  Psychiatric/Behavioral:  Negative for depression, hallucinations and suicidal ideas.   Blood pressure 134/73, pulse 92, temperature 98.4 F (36.9 C), temperature source Oral, resp. rate 18, height 5\' 7"  (1.702 m), weight 106.9 kg, SpO2 93 %. Body mass index is 36.91 kg/m. PGY-2  Freida Busman, MD 05/12/2021 4:17 PM

## 2021-08-28 DIAGNOSIS — F32A Depression, unspecified: Secondary | ICD-10-CM | POA: Insufficient documentation

## 2023-09-30 ENCOUNTER — Encounter: Payer: Self-pay | Admitting: Physician Assistant

## 2023-09-30 ENCOUNTER — Ambulatory Visit: Admitting: Physician Assistant

## 2023-09-30 VITALS — BP 130/86 | HR 92 | Temp 97.4°F | Ht 67.0 in | Wt 225.0 lb

## 2023-09-30 DIAGNOSIS — Z794 Long term (current) use of insulin: Secondary | ICD-10-CM

## 2023-09-30 DIAGNOSIS — E039 Hypothyroidism, unspecified: Secondary | ICD-10-CM

## 2023-09-30 DIAGNOSIS — E1165 Type 2 diabetes mellitus with hyperglycemia: Secondary | ICD-10-CM | POA: Diagnosis not present

## 2023-09-30 DIAGNOSIS — G894 Chronic pain syndrome: Secondary | ICD-10-CM

## 2023-09-30 DIAGNOSIS — I10 Essential (primary) hypertension: Secondary | ICD-10-CM

## 2023-09-30 DIAGNOSIS — K5730 Diverticulosis of large intestine without perforation or abscess without bleeding: Secondary | ICD-10-CM

## 2023-09-30 DIAGNOSIS — Z7689 Persons encountering health services in other specified circumstances: Secondary | ICD-10-CM

## 2023-09-30 DIAGNOSIS — F341 Dysthymic disorder: Secondary | ICD-10-CM | POA: Diagnosis not present

## 2023-09-30 DIAGNOSIS — M51362 Other intervertebral disc degeneration, lumbar region with discogenic back pain and lower extremity pain: Secondary | ICD-10-CM

## 2023-09-30 DIAGNOSIS — M51369 Other intervertebral disc degeneration, lumbar region without mention of lumbar back pain or lower extremity pain: Secondary | ICD-10-CM | POA: Insufficient documentation

## 2023-09-30 DIAGNOSIS — K219 Gastro-esophageal reflux disease without esophagitis: Secondary | ICD-10-CM | POA: Insufficient documentation

## 2023-09-30 MED ORDER — HYDROCHLOROTHIAZIDE 12.5 MG PO CAPS: 12.5000 mg | ORAL_CAPSULE | Freq: Every day | ORAL | 1 refills | Status: DC

## 2023-09-30 MED ORDER — GABAPENTIN 100 MG PO CAPS: 100.0000 mg | ORAL_CAPSULE | Freq: Two times a day (BID) | ORAL | 1 refills | Status: DC

## 2023-09-30 MED ORDER — VENLAFAXINE HCL ER 37.5 MG PO CP24: 112.5000 mg | ORAL_CAPSULE | Freq: Every day | ORAL | 1 refills | Status: DC

## 2023-09-30 MED ORDER — BASAGLAR KWIKPEN 100 UNIT/ML ~~LOC~~ SOPN: 10.0000 [IU] | PEN_INJECTOR | Freq: Every day | SUBCUTANEOUS | 5 refills | Status: DC

## 2023-09-30 MED ORDER — DICYCLOMINE HCL 20 MG PO TABS: 20.0000 mg | ORAL_TABLET | Freq: Four times a day (QID) | ORAL | 1 refills | Status: DC

## 2023-09-30 MED ORDER — PERINDOPRIL ERBUMINE 4 MG PO TABS: 4.0000 mg | ORAL_TABLET | Freq: Every day | ORAL | 1 refills | Status: DC

## 2023-09-30 MED ORDER — METOPROLOL TARTRATE 25 MG PO TABS: 12.5000 mg | ORAL_TABLET | Freq: Two times a day (BID) | ORAL | 1 refills | Status: DC

## 2023-09-30 MED ORDER — VENLAFAXINE HCL ER 75 MG PO CP24
75.0000 mg | ORAL_CAPSULE | Freq: Every day | ORAL | 1 refills | Status: DC
Start: 2023-09-30 — End: 2024-03-31

## 2023-09-30 NOTE — Assessment & Plan Note (Signed)
 Stable today. PHQ 9 reviewed. Medication refilled. Continue current treatment plan.

## 2023-09-30 NOTE — Assessment & Plan Note (Signed)
 Stable, no concerns today. Bentyl refilled today. Continue current treatment plan.

## 2023-09-30 NOTE — Assessment & Plan Note (Signed)
 Follows with pain medicine. Would like a referral closer to [facility]. She reports chronic pain related to amyloidosis. Continue current treatment plan.

## 2023-09-30 NOTE — Progress Notes (Signed)
 New Patient Office Visit  Subjective    Patient ID: Sheryl Henry, female    DOB: 1956/05/16  Age: 68 y.o. MRN: [id number]  CC:  Chief Complaint  Patient presents with   New Patient (Initial Visit)    HPI Sheryl Henry presents to establish care  Patient presents today with past medical history significant for hypertension, type 2 diabetes, degenerative disc disease, cutaneous amyloidosis managed by hem/onc, diverticulosis, hypothyroidism, depression, and chronic pain. She does not have significant concerns or complaints today. She is in need of medication refills today.   Outpatient Encounter Medications as of 09/30/2023  Medication Sig   cetirizine (ZYRTEC) 10 MG tablet Take 1 tablet by mouth daily.   CINNAMON PO Take 1 capsule by mouth in the morning and at bedtime.   Coenzyme Q10 (COQ10 PO) Take 1 tablet by mouth daily.   CRANBERRY PO Take 1 tablet by mouth in the morning and at bedtime.   levothyroxine (SYNTHROID) 50 MCG tablet Take 50 mcg by mouth daily.   Multiple Vitamin (HEALTHY HAIR/SKIN/NAILS PO) Take by mouth.   Multiple Vitamin (MULTIVITAMIN) tablet Take 1 tablet by mouth daily.   Multiple Vitamins-Minerals (EYE SUPPORT PO) Take by mouth.   Multiple Vitamins-Minerals (IMMUNE SUPPORT PO) Take by mouth.   naproxen sodium (ALEVE) 220 MG tablet Take 220 mg by mouth daily as needed (PAIN).   NON FORMULARY "Circulation and Vein support" MVI            Hesperidin  100 mg Vitamin C 30 mg                                     Rutin  20 mg Flavonoids 720 mg                                 Grape seed extract   40 mg Diosmin  600 mg                                    Pine Bark Extract   40 mg Take 1 capsule by mouth every morning Butcher Broom extract 40 mg   Omega-3 1000 MG CAPS Take 1 capsule by mouth in the morning and at bedtime.   oxyCODONE-acetaminophen (PERCOCET/ROXICET) 5-325 MG tablet Take 1 tablet by mouth 4 (four) times daily as needed for up to 4 doses for  moderate pain.   UNABLE TO FIND Med Name: brain and memory support   venlafaxine XR (EFFEXOR XR) 75 MG 24 hr capsule Take 1 capsule (75 mg total) by mouth daily with breakfast.   [DISCONTINUED] dicyclomine (BENTYL) 20 MG tablet Take 20 mg by mouth 4 (four) times daily.   [DISCONTINUED] hydrochlorothiazide (MICROZIDE) 12.5 MG capsule Take 12.5 mg by mouth daily.   [DISCONTINUED] Insulin Glargine (BASAGLAR KWIKPEN) 100 UNIT/ML Inject 10 Units into the skin daily.   [DISCONTINUED] metoprolol tartrate (LOPRESSOR) 25 MG tablet Take 0.5 tablets (12.5 mg total) by mouth 2 (two) times daily.   [DISCONTINUED] perindopril (ACEON) 4 MG tablet Take 4 mg by mouth daily.   [DISCONTINUED] risperiDONE (RISPERDAL M-TABS) 1 MG disintegrating tablet Take 1 tablet (1 mg total) by mouth 2 (two) times daily.   [DISCONTINUED] venlafaxine XR (EFFEXOR-XR) 37.5 MG 24 hr capsule Take 3 capsules (112.5  mg total) by mouth daily.   [DISCONTINUED] zinc sulfate 220 (50 Zn) MG capsule Take 1 capsule (220 mg total) by mouth daily.   dicyclomine (BENTYL) 20 MG tablet Take 1 tablet (20 mg total) by mouth 4 (four) times daily.   gabapentin (NEURONTIN) 100 MG capsule Take 1 capsule (100 mg total) by mouth 2 (two) times daily.   hydrochlorothiazide (MICROZIDE) 12.5 MG capsule Take 1 capsule (12.5 mg total) by mouth daily.   Insulin Glargine (BASAGLAR KWIKPEN) 100 UNIT/ML Inject 10 Units into the skin daily.   metoprolol tartrate (LOPRESSOR) 25 MG tablet Take 0.5 tablets (12.5 mg total) by mouth 2 (two) times daily.   perindopril (ACEON) 4 MG tablet Take 1 tablet (4 mg total) by mouth daily.   venlafaxine XR (EFFEXOR-XR) 37.5 MG 24 hr capsule Take 3 capsules (112.5 mg total) by mouth daily.   [DISCONTINUED] gabapentin (NEURONTIN) 100 MG capsule Take 100 mg by mouth 2 (two) times daily.   No facility-administered encounter medications on file as of 09/30/2023.    Past Medical History:  Diagnosis Date   AL amyloidosis (HCC)     Cutaneous amyloidosis (HCC)    Diabetes mellitus without complication (HCC)    Diverticulosis    Heat intolerance    Hypertension    Hyperthyroidism    Neuropathy    Raynaud disease     Past Surgical History:  Procedure Laterality Date   BACK SURGERY     BREAST SURGERY     laproscopy     MANDIBLE SURGERY      Family History  Problem Relation Age of Onset   Breast cancer Mother    Colon cancer Father     Social History   Socioeconomic History   Marital status: Divorced    Spouse name: Not on file   Number of children: Not on file   Years of education: Not on file   Highest education level: Not on file  Occupational History   Not on file  Tobacco Use   Smoking status: Never   Smokeless tobacco: Never  Vaping Use   Vaping status: Never Used  Substance and Sexual Activity   Alcohol use: Not Currently   Drug use: Never   Sexual activity: Not Currently  Other Topics Concern   Not on file  Social History Narrative   Not on file   Social Drivers of Health   Financial Resource Strain: Not on file  Food Insecurity: Not on file  Transportation Needs: Not on file  Physical Activity: Not on file  Stress: Not on file  Social Connections: Not on file  Intimate Partner Violence: Not on file    Review of Systems  Constitutional:  Positive for malaise/fatigue. Negative for chills and fever.  Respiratory:  Negative for cough and shortness of breath.   Cardiovascular:  Negative for chest pain and palpitations.  Gastrointestinal:  Negative for abdominal pain, diarrhea, nausea and vomiting.  Genitourinary:  Negative for dysuria and urgency.        Objective    BP 130/86   Pulse 92   Temp (!) 97.4 F (36.3 C)   Ht 5\' 7"  (1.702 m)   Wt 225 lb (102.1 kg)   SpO2 95%   BMI 35.24 kg/m   Physical Exam Constitutional:      Appearance: Normal appearance. She is obese.  HENT:     Head: Normocephalic.     Mouth/Throat:     Mouth: Mucous membranes are moist.  Pharynx: Oropharynx is clear.  Eyes:     Extraocular Movements: Extraocular movements intact.     Conjunctiva/sclera: Conjunctivae normal.  Cardiovascular:     Rate and Rhythm: Normal rate and regular rhythm.     Heart sounds: Normal heart sounds. No murmur heard. Pulmonary:     Effort: Pulmonary effort is normal.     Breath sounds: Normal breath sounds.  Musculoskeletal:     Right lower leg: No edema.     Left lower leg: No edema.  Skin:    General: Skin is warm and dry.  Neurological:     General: No focal deficit present.     Mental Status: She is alert and oriented to person, place, and time.  Psychiatric:        Mood and Affect: Mood normal.        Behavior: Behavior normal.     Labs ordered today    Assessment & Plan:  Encounter to establish care  Primary hypertension Assessment & Plan: 130/86 Controlled. Continue current medications. No change in management. Discussed DASH diet and dietary sodium restrictions.  Continue dietary efforts and physical activity. Lab work today.   Orders: -     hydroCHLOROthiazide; Take 1 capsule (12.5 mg total) by mouth daily.  Dispense: 90 capsule; Refill: 1 -     Metoprolol Tartrate; Take 0.5 tablets (12.5 mg total) by mouth 2 (two) times daily.  Dispense: 90 tablet; Refill: 1 -     Perindopril Erbumine; Take 1 tablet (4 mg total) by mouth daily.  Dispense: 90 tablet; Refill: 1 -     CBC with Differential/Platelet -     CMP14+EGFR  Type 2 diabetes mellitus with hyperglycemia, with long-term current use of insulin (HCC) Assessment & Plan: Not Controlled. Hyperglycemia. Continue current management.  Has been off insulin the last few months due to cost concerns. Will refill insulin today.  Patient is on ACEI. Lipid panel checked ordered today. Microalbumin- next visit.  Continue dietary efforts and physical activity. A1c today.    Orders: -     Gabapentin; Take 1 capsule (100 mg total) by mouth 2 (two) times daily.   Dispense: 180 capsule; Refill: 1 -     CMP14+EGFR -     Lipid panel -     Hemoglobin A1c -     Basaglar KwikPen; Inject 10 Units into the skin daily.  Dispense: 3 mL; Refill: 5  Persistent depressive disorder Assessment & Plan: Stable today. PHQ 9 reviewed. Medication refilled. Continue current treatment plan.   Orders: -     Venlafaxine HCl ER; Take 3 capsules (112.5 mg total) by mouth daily.  Dispense: 270 capsule; Refill: 1 -     Venlafaxine HCl ER; Take 1 capsule (75 mg total) by mouth daily with breakfast.  Dispense: 90 capsule; Refill: 1  Hypothyroidism, unspecified type Assessment & Plan: Stable on daily levothyroxine. TSH/T4 today, will adjust medication as indicated. Continue current management.  Orders: -     TSH + free T4  Degeneration of intervertebral disc of lumbar region with discogenic back pain and lower extremity pain Assessment & Plan: Stable on gabapentin daily. Medication refilled today, continue on current treatment.    Orders: -     Gabapentin; Take 1 capsule (100 mg total) by mouth 2 (two) times daily.  Dispense: 180 capsule; Refill: 1  Chronic pain syndrome Assessment & Plan: Follows with pain medicine. Would like a referral closer to [facility]. She reports chronic pain related to amyloidosis. Continue  current treatment plan.    Orders: -     Ambulatory referral to Pain Clinic  Diverticulosis of large intestine without hemorrhage Assessment & Plan: Stable, no concerns today. Bentyl refilled today. Continue current treatment plan.   Orders: -     Dicyclomine HCl; Take 1 tablet (20 mg total) by mouth 4 (four) times daily.  Dispense: 360 tablet; Refill: 1    Return in about 6 months (around 03/31/2024), or as needed.   Toni Amend Mikkel Charrette, PA-C

## 2023-09-30 NOTE — Assessment & Plan Note (Signed)
 Not Controlled. Hyperglycemia. Continue current management.  Has been off insulin the last few months due to cost concerns. Will refill insulin today.  Patient is on ACEI. Lipid panel checked ordered today. Microalbumin- next visit.  Continue dietary efforts and physical activity. A1c today.

## 2023-09-30 NOTE — Assessment & Plan Note (Signed)
 130/86 Controlled. Continue current medications. No change in management. Discussed DASH diet and dietary sodium restrictions.  Continue dietary efforts and physical activity. Lab work today.

## 2023-09-30 NOTE — Assessment & Plan Note (Signed)
 Stable on gabapentin daily. Medication refilled today, continue on current treatment.

## 2023-09-30 NOTE — Assessment & Plan Note (Signed)
 Stable on daily levothyroxine. TSH/T4 today, will adjust medication as indicated. Continue current management.

## 2023-10-01 ENCOUNTER — Telehealth: Payer: Self-pay

## 2023-10-01 MED ORDER — VENLAFAXINE HCL ER 37.5 MG PO CP24
37.5000 mg | ORAL_CAPSULE | Freq: Every day | ORAL | 1 refills | Status: DC
Start: 2023-10-01 — End: 2024-03-31

## 2023-10-01 NOTE — Addendum Note (Signed)
 Addended by: Olga Millers on: 10/01/2023 02:50 PM   Modules accepted: Orders

## 2023-10-01 NOTE — Telephone Encounter (Signed)
 Grooms, [place], [place]     Prescription fixed and resent.

## 2023-10-01 NOTE — Telephone Encounter (Signed)
 Patient notified- patient states she just completed her lab work

## 2023-10-01 NOTE — Telephone Encounter (Signed)
 Patient come in yesterday to see Toni Amend and there was a RX wrote for venlafaxine XR (EFFEXOR-XR) 37.5 MG 24 hr capsule this is supposed to be for once a day and not three times a day. She stop by to see if this can be fixed. Pharmacy called her.   The 75mg  was wrote correctly

## 2023-10-02 LAB — CBC WITH DIFFERENTIAL/PLATELET
Basophils Absolute: 0 10*3/uL (ref 0.0–0.2)
Basos: 0 %
EOS (ABSOLUTE): 0.3 10*3/uL (ref 0.0–0.4)
Eos: 3 %
Hematocrit: 43.8 % (ref 34.0–46.6)
Hemoglobin: 14.7 g/dL (ref 11.1–15.9)
Immature Grans (Abs): 0 10*3/uL (ref 0.0–0.1)
Immature Granulocytes: 0 %
Lymphocytes Absolute: 2.6 10*3/uL (ref 0.7–3.1)
Lymphs: 36 %
MCH: 30.9 pg (ref 26.6–33.0)
MCHC: 33.6 g/dL (ref 31.5–35.7)
MCV: 92 fL (ref 79–97)
Monocytes Absolute: 0.5 10*3/uL (ref 0.1–0.9)
Monocytes: 7 %
Neutrophils Absolute: 3.9 10*3/uL (ref 1.4–7.0)
Neutrophils: 54 %
Platelets: 186 10*3/uL (ref 150–450)
RBC: 4.75 x10E6/uL (ref 3.77–5.28)
RDW: 13.1 % (ref 11.7–15.4)
WBC: 7.4 10*3/uL (ref 3.4–10.8)

## 2023-10-02 LAB — CMP14+EGFR
ALT: 24 IU/L (ref 0–32)
AST: 31 IU/L (ref 0–40)
Albumin: 4 g/dL (ref 3.9–4.9)
Alkaline Phosphatase: 87 IU/L (ref 44–121)
BUN/Creatinine Ratio: 28 (ref 12–28)
BUN: 26 mg/dL (ref 8–27)
Bilirubin Total: 0.3 mg/dL (ref 0.0–1.2)
CO2: 23 mmol/L (ref 20–29)
Calcium: 9 mg/dL (ref 8.7–10.3)
Chloride: 103 mmol/L (ref 96–106)
Creatinine, Ser: 0.94 mg/dL (ref 0.57–1.00)
Globulin, Total: 2.3 g/dL (ref 1.5–4.5)
Glucose: 166 mg/dL — ABNORMAL HIGH (ref 70–99)
Potassium: 4.2 mmol/L (ref 3.5–5.2)
Sodium: 142 mmol/L (ref 134–144)
Total Protein: 6.3 g/dL (ref 6.0–8.5)
eGFR: 67 mL/min/{1.73_m2} (ref >59)

## 2023-10-02 LAB — LIPID PANEL
Chol/HDL Ratio: 3.9 ratio (ref 0.0–4.4)
Cholesterol, Total: 187 mg/dL (ref 100–199)
HDL: 48 mg/dL (ref >39)
LDL Chol Calc (NIH): 99 mg/dL (ref 0–99)
Triglycerides: 235 mg/dL — ABNORMAL HIGH (ref 0–149)
VLDL Cholesterol Cal: 40 mg/dL (ref 5–40)

## 2023-10-02 LAB — TSH+FREE T4
Free T4: 1.17 ng/dL (ref 0.82–1.77)
TSH: 4.29 u[IU]/mL (ref 0.450–4.500)

## 2023-10-02 LAB — HEMOGLOBIN A1C
Est. average glucose Bld gHb Est-mCnc: 189 mg/dL
Hgb A1c MFr Bld: 8.2 % — ABNORMAL HIGH (ref 4.8–5.6)

## 2023-10-04 ENCOUNTER — Other Ambulatory Visit: Payer: Self-pay | Admitting: Physician Assistant

## 2023-10-04 DIAGNOSIS — E039 Hypothyroidism, unspecified: Secondary | ICD-10-CM

## 2023-10-04 MED ORDER — LEVOTHYROXINE SODIUM 50 MCG PO TABS: 50.0000 ug | ORAL_TABLET | Freq: Every day | ORAL | 1 refills | Status: DC

## 2023-10-05 ENCOUNTER — Other Ambulatory Visit: Payer: Self-pay

## 2023-10-05 ENCOUNTER — Telehealth: Payer: Self-pay

## 2023-10-05 DIAGNOSIS — Z794 Long term (current) use of insulin: Secondary | ICD-10-CM

## 2023-10-05 MED ORDER — BASAGLAR KWIKPEN 100 UNIT/ML ~~LOC~~ SOPN: 10.0000 [IU] | PEN_INJECTOR | Freq: Every day | SUBCUTANEOUS | 5 refills | Status: DC

## 2023-10-05 MED ORDER — BASAGLAR KWIKPEN 100 UNIT/ML ~~LOC~~ SOPN: 10.0000 [IU] | PEN_INJECTOR | Freq: Every day | SUBCUTANEOUS | 1 refills | Status: DC

## 2023-10-05 NOTE — Addendum Note (Signed)
 Addended by: Marco Severs on: 10/05/2023 12:25 PM   Modules accepted: Orders

## 2023-10-05 NOTE — Telephone Encounter (Signed)
 Caller states they have called several times regarding the quantity being incorrect for patient Insulin Glargine (BASAGLAR KWIKPEN) 100 UNIT/ML.  Caller states the Orthoptist will not allow the pharmacy to dispense partial boxes on the insulin pens therefore it has to be written for 15 ML, that's the smaler size that it comes in. Can the nurse call the pharmacist directly regarding this ongoing issue.

## 2023-10-05 NOTE — Telephone Encounter (Signed)
 Patient pharmacy called the patient and said that the way the provider wrote the RX for Insulin Glargine [facility]) 100 UNIT/ML comes out to like a half a box the pharmacy said it has to be at least a whole box because they can not break a box up and insurance to cover as well.   [facility] [place] [phone]  Pt call back [phone]

## 2023-10-05 NOTE — Telephone Encounter (Signed)
 Copied from CRM [id number]. Topic: Clinical - Prescription Issue >> Oct 05, 2023 10:11 AM Felizardo Hotter wrote: Reason for CRM: Pt called stated prescription needs two boxes instead of one for Insulin Glargine (BASAGLAR KWIKPEN) 100 UNIT/ML. Please call pharmacy and pt at [phone].

## 2023-10-05 NOTE — Telephone Encounter (Signed)
 Grooms, [place], PA-C   Resent prescription with proper amount.

## 2024-01-05 ENCOUNTER — Other Ambulatory Visit: Payer: Self-pay | Admitting: Physician Assistant

## 2024-01-05 DIAGNOSIS — I10 Essential (primary) hypertension: Secondary | ICD-10-CM

## 2024-02-16 ENCOUNTER — Other Ambulatory Visit: Payer: Self-pay | Admitting: Physician Assistant

## 2024-02-16 DIAGNOSIS — Z794 Long term (current) use of insulin: Secondary | ICD-10-CM

## 2024-03-09 ENCOUNTER — Ambulatory Visit: Payer: Self-pay

## 2024-03-09 NOTE — Telephone Encounter (Signed)
 FYI Only or Action Required?: Action required by provider: referral request.  Patient was last seen in primary care on 09/30/2023 by Grooms, [place], PA-C.  Called Nurse Triage reporting Referral.  Symptoms began today.  Interventions attempted: Nothing.  Symptoms are: stable. Pt. Reports her pain management clinic has moved and she would like to be referred to pain management affiliated with [facility]. Please advise pt.  Triage Disposition: Call PCP Within 24 Hours  Patient/caregiver understands and will follow disposition?: Yes   Copied from CRM [id number]. Topic: Clinical - Red Word Triage >> Mar 09, 2024  2:34 PM Sheryl Henry wrote: Red Word that prompted transfer to Nurse Triage: Patient is requesting a referral to pain management because current pain management clinic is moving to [place].  Patient is currently taking oxyCODONE -acetaminophen  (PERCOCET/ROXICET) 5-325 MG tablet [[id number]].   Patient is reporting that she is in constant pain in Lower back, L&R leg. Reason for Disposition  [1] Follow-up call from patient regarding patient's clinical status AND [2] information NON-URGENT  Answer Assessment - Initial Assessment Questions 1. REASON FOR CALL or QUESTION: What is your reason for calling today? or How can I best     Her pain management clinic has moved, asking to be seen 2. CALLER: Document the source of call. (e.g., laboratory staff, caregiver or patient).     Pt.  Protocols used: PCP Call - No Triage-A-AH

## 2024-03-13 ENCOUNTER — Other Ambulatory Visit: Payer: Self-pay | Admitting: Physician Assistant

## 2024-03-13 DIAGNOSIS — G894 Chronic pain syndrome: Secondary | ICD-10-CM

## 2024-03-17 ENCOUNTER — Ambulatory Visit

## 2024-03-18 LAB — COLOGUARD

## 2024-03-31 ENCOUNTER — Ambulatory Visit: Admitting: Physician Assistant

## 2024-03-31 ENCOUNTER — Encounter: Payer: Self-pay | Admitting: Physician Assistant

## 2024-03-31 VITALS — BP 144/70 | HR 80 | Resp 16 | Ht 66.5 in | Wt 235.1 lb

## 2024-03-31 DIAGNOSIS — F341 Dysthymic disorder: Secondary | ICD-10-CM

## 2024-03-31 DIAGNOSIS — I10 Essential (primary) hypertension: Secondary | ICD-10-CM | POA: Diagnosis not present

## 2024-03-31 DIAGNOSIS — G5603 Carpal tunnel syndrome, bilateral upper limbs: Secondary | ICD-10-CM

## 2024-03-31 DIAGNOSIS — E1165 Type 2 diabetes mellitus with hyperglycemia: Secondary | ICD-10-CM

## 2024-03-31 DIAGNOSIS — Z1211 Encounter for screening for malignant neoplasm of colon: Secondary | ICD-10-CM

## 2024-03-31 DIAGNOSIS — E039 Hypothyroidism, unspecified: Secondary | ICD-10-CM

## 2024-03-31 DIAGNOSIS — K5730 Diverticulosis of large intestine without perforation or abscess without bleeding: Secondary | ICD-10-CM

## 2024-03-31 DIAGNOSIS — Z794 Long term (current) use of insulin: Secondary | ICD-10-CM | POA: Diagnosis not present

## 2024-03-31 DIAGNOSIS — Z1231 Encounter for screening mammogram for malignant neoplasm of breast: Secondary | ICD-10-CM

## 2024-03-31 DIAGNOSIS — Z13820 Encounter for screening for osteoporosis: Secondary | ICD-10-CM

## 2024-03-31 MED ORDER — GABAPENTIN 300 MG PO CAPS: 300.0000 mg | ORAL_CAPSULE | Freq: Three times a day (TID) | ORAL | 1 refills | Status: DC

## 2024-03-31 MED ORDER — LEVOTHYROXINE SODIUM 50 MCG PO TABS: 50.0000 ug | ORAL_TABLET | Freq: Every day | ORAL | 1 refills | Status: AC

## 2024-03-31 MED ORDER — METOPROLOL TARTRATE 25 MG PO TABS: 12.5000 mg | ORAL_TABLET | Freq: Two times a day (BID) | ORAL | 1 refills | Status: AC

## 2024-03-31 MED ORDER — PERINDOPRIL ERBUMINE 8 MG PO TABS: 8.0000 mg | ORAL_TABLET | Freq: Every day | ORAL | 1 refills | Status: AC

## 2024-03-31 MED ORDER — PEN NEEDLES 31G X 6 MM MISC: 1.0000 | Freq: Every day | 1 refills | Status: AC | PRN

## 2024-03-31 MED ORDER — BASAGLAR KWIKPEN 100 UNIT/ML ~~LOC~~ SOPN: 10.0000 [IU] | PEN_INJECTOR | Freq: Every day | SUBCUTANEOUS | 1 refills | Status: DC

## 2024-03-31 MED ORDER — HYDROCHLOROTHIAZIDE 12.5 MG PO CAPS: 12.5000 mg | ORAL_CAPSULE | Freq: Every day | ORAL | 1 refills | Status: AC

## 2024-03-31 MED ORDER — VENLAFAXINE HCL ER 37.5 MG PO CP24
37.5000 mg | ORAL_CAPSULE | Freq: Every day | ORAL | 1 refills | Status: AC
Start: 2024-03-31 — End: ?

## 2024-03-31 MED ORDER — VENLAFAXINE HCL ER 75 MG PO CP24: 75.0000 mg | ORAL_CAPSULE | Freq: Every day | ORAL | 1 refills | Status: AC

## 2024-03-31 MED ORDER — DICYCLOMINE HCL 20 MG PO TABS: 20.0000 mg | ORAL_TABLET | Freq: Four times a day (QID) | ORAL | 1 refills | Status: AC

## 2024-03-31 NOTE — Progress Notes (Signed)
 Established Patient Office Visit  Subjective   Patient ID: Sheryl Henry, female    DOB: 1956/03/20  Age: 68 y.o. MRN: [id number]  Chief Complaint  Patient presents with   Medical Management of Chronic Issues    6 month follow up    Hand Pain    Pt complains of bilateral hand and wrist pain x6 months. Wear braces that manage pain   Discussed the use of AI scribe software for clinical note transcription with the patient, who gave verbal consent to proceed.  History of Present Illness Sheryl Henry is a 68 year old female with diabetes and hypertension who presents with worsening hand pain and numbness.  She experiences sharp pain and numbness in her hands, particularly in the thumb and two fingers bilaterally. The pain disrupts her sleep. Braces alleviate the pain but cause numbness, affecting her grip and dexterity. Symptoms have persisted for a year, worsening over the past six months. She uses gabapentin  100 mg twice daily.  She manages diabetes with insulin , 10 units nightly, but her blood sugars remain high, although she does not regularly monitor her blood sugar at home. Her blood pressure is elevated; she takes Aceon , previously 8 mg, now reduced to 4 mg. She denies headaches, chest pain, shortness of breath, or vision changes.    Review of Systems  Constitutional:  Negative for chills, fever and malaise/fatigue.  Eyes:  Negative for blurred vision and double vision.  Respiratory:  Negative for cough and shortness of breath.   Cardiovascular:  Negative for chest pain and palpitations.  Musculoskeletal:  Positive for back pain, joint pain and myalgias.       Hand pain  Neurological:  Positive for tingling and sensory change. Negative for dizziness and headaches.  Psychiatric/Behavioral:  Negative for depression. The patient is not nervous/anxious.       Objective:     BP (!) 144/70   Pulse 80   Resp 16   Ht 5' 6.5 (1.689 m)   Wt 235 lb 1.9 oz (106.6 kg)   SpO2  92%   BMI 37.38 kg/m    Physical Exam Constitutional:      General: She is not in acute distress.    Appearance: Normal appearance. She is obese. She is not ill-appearing.  HENT:     Head: Normocephalic and atraumatic.     Mouth/Throat:     Mouth: Mucous membranes are moist.     Pharynx: Oropharynx is clear.  Eyes:     Extraocular Movements: Extraocular movements intact.     Conjunctiva/sclera: Conjunctivae normal.  Cardiovascular:     Rate and Rhythm: Normal rate and regular rhythm.     Heart sounds: Normal heart sounds. No murmur heard. Pulmonary:     Effort: Pulmonary effort is normal.     Breath sounds: Normal breath sounds.  Musculoskeletal:     Right hand: No swelling or tenderness. Normal range of motion. Decreased strength. Decreased sensation of the median distribution. Normal capillary refill. Normal pulse.     Left hand: No swelling or tenderness. Normal range of motion. Decreased strength. Decreased sensation of the median distribution. Normal capillary refill. Normal pulse.     Right lower leg: No edema.     Left lower leg: No edema.  Skin:    General: Skin is warm and dry.  Neurological:     General: No focal deficit present.     Mental Status: She is alert and oriented to person, place, and time.  Psychiatric:        Mood and Affect: Mood normal.        Behavior: Behavior normal.     No results found for any visits on 03/31/24.  The ASCVD Risk score (Arnett DK, et al., 2019) failed to calculate for the following reasons:   Risk score cannot be calculated because patient has a medical history suggesting prior/existing ASCVD    Assessment & Plan:   Return in about 6 months (around 09/29/2024).   Type 2 diabetes mellitus with hyperglycemia, with long-term current use of insulin  (HCC) Assessment & Plan: Hyperglycemia with insulin  use. Reports high blood sugar levels but does not check glucose at home. Insulin  regimen includes 10 units at night. Yearly eye  exams advised for diabetic retinopathy monitoring. Potential initiation of Crestor if cholesterol levels are elevated due to increased cardiovascular risk. - Update lab work today including A1c and urine ACR.  - Advise yearly eye exams to monitor for diabetic retinopathy. - Discuss potential initiation of Crestor if cholesterol levels are elevated. - Continue current medication regimen pending lab results.   Orders: -     Lipid panel -     CMP14+EGFR -     CBC with Differential/Platelet -     Hemoglobin A1c -     Microalbumin / creatinine urine ratio -     Basaglar  KwikPen; Inject 10 Units into the skin daily.  Dispense: 15 mL; Refill: 1 -     Pen Needles; 1 each by Does not apply route daily as needed.  Dispense: 100 each; Refill: 1  Primary hypertension Assessment & Plan: 153/73, 144/70 Elevated. Continue current medications. Increasing Aceon  to 8 mg daily.  Discussed DASH diet and dietary sodium restrictions.  Increase dietary efforts and physical activity.   Orders: -     CMP14+EGFR -     CBC with Differential/Platelet -     Microalbumin / creatinine urine ratio -     hydroCHLOROthiazide ; Take 1 capsule (12.5 mg total) by mouth daily.  Dispense: 90 capsule; Refill: 1 -     Metoprolol  Tartrate; Take 0.5 tablets (12.5 mg total) by mouth 2 (two) times daily.  Dispense: 90 tablet; Refill: 1 -     Perindopril  Erbumine; Take 1 tablet (8 mg total) by mouth daily.  Dispense: 90 tablet; Refill: 1  Bilateral carpal tunnel syndrome Assessment & Plan: Chronic bilateral hand pain and numbness likely due to nerve compression. Symptoms worsened over the past year, affecting grip strength and daily activities. Braces provide some relief but cause numbness. Gabapentin  at 100 mg twice daily with potential for dosage increase. Referral to hand specialist considered for further evaluation and management options. - Increase gabapentin  to 300 mg twice daily. - Refer to hand specialist for further  evaluation and potential management options such as steroid injections. - Continue symptomatic care at home.   Orders: -     Ambulatory referral to Hand Surgery -     Gabapentin ; Take 1 capsule (300 mg total) by mouth 3 (three) times daily.  Dispense: 90 capsule; Refill: 1  Diverticulosis of large intestine without hemorrhage -     Dicyclomine  HCl; Take 1 tablet (20 mg total) by mouth 4 (four) times daily.  Dispense: 360 tablet; Refill: 1  Hypothyroidism, unspecified type -     Levothyroxine  Sodium; Take 1 tablet (50 mcg total) by mouth daily.  Dispense: 90 tablet; Refill: 1  Persistent depressive disorder -     Venlafaxine  HCl ER; Take 1 capsule (  75 mg total) by mouth daily with breakfast.  Dispense: 90 capsule; Refill: 1 -     Venlafaxine  HCl ER; Take 1 capsule (37.5 mg total) by mouth daily.  Dispense: 90 capsule; Refill: 1  Screening for osteoporosis -     DG Bone Density  Encounter for screening mammogram for malignant neoplasm of breast -     3D Screening Mammogram, Left and Right  Screen for colon cancer -     Cologuard   Bowen Kia, PA-C

## 2024-03-31 NOTE — Assessment & Plan Note (Addendum)
 Chronic bilateral hand pain and numbness likely due to nerve compression. Symptoms worsened over the past year, affecting grip strength and daily activities. Braces provide some relief but cause numbness. Gabapentin  at 100 mg twice daily with potential for dosage increase. Referral to hand specialist considered for further evaluation and management options. - Increase gabapentin  to 300 mg twice daily. - Refer to hand specialist for further evaluation and potential management options such as steroid injections. - Continue symptomatic care at home.

## 2024-03-31 NOTE — Assessment & Plan Note (Signed)
 153/73, 144/70 Elevated. Continue current medications. Increasing Aceon  to 8 mg daily.  Discussed DASH diet and dietary sodium restrictions.  Increase dietary efforts and physical activity.

## 2024-03-31 NOTE — Patient Instructions (Signed)
 Please call Sheryl [facility] Mammography/Bone density scheduling [phone]

## 2024-03-31 NOTE — Assessment & Plan Note (Signed)
 Hyperglycemia with insulin  use. Reports high blood sugar levels but does not check glucose at home. Insulin  regimen includes 10 units at night. Yearly eye exams advised for diabetic retinopathy monitoring. Potential initiation of Crestor if cholesterol levels are elevated due to increased cardiovascular risk. - Update lab work today including A1c and urine ACR.  - Advise yearly eye exams to monitor for diabetic retinopathy. - Discuss potential initiation of Crestor if cholesterol levels are elevated. - Continue current medication regimen pending lab results.

## 2024-04-02 LAB — CMP14+EGFR
ALT: 17 IU/L (ref 0–32)
AST: 27 IU/L (ref 0–40)
Albumin: 4.1 g/dL (ref 3.9–4.9)
Alkaline Phosphatase: 111 IU/L (ref 49–135)
BUN/Creatinine Ratio: 19 (ref 12–28)
BUN: 19 mg/dL (ref 8–27)
Bilirubin Total: 0.3 mg/dL (ref 0.0–1.2)
CO2: 27 mmol/L (ref 20–29)
Calcium: 9.5 mg/dL (ref 8.7–10.3)
Chloride: 94 mmol/L — ABNORMAL LOW (ref 96–106)
Creatinine, Ser: 0.99 mg/dL (ref 0.57–1.00)
Globulin, Total: 3.1 g/dL (ref 1.5–4.5)
Glucose: 226 mg/dL — ABNORMAL HIGH (ref 70–99)
Potassium: 4.6 mmol/L (ref 3.5–5.2)
Sodium: 135 mmol/L (ref 134–144)
Total Protein: 7.2 g/dL (ref 6.0–8.5)
eGFR: 62 mL/min/1.73 (ref >59)

## 2024-04-02 LAB — CBC WITH DIFFERENTIAL/PLATELET
Basophils Absolute: 0 x10E3/uL (ref 0.0–0.2)
Basos: 0 %
EOS (ABSOLUTE): 0.3 x10E3/uL (ref 0.0–0.4)
Eos: 4 %
Hematocrit: 40.2 % (ref 34.0–46.6)
Hemoglobin: 13.5 g/dL (ref 11.1–15.9)
Immature Grans (Abs): 0 x10E3/uL (ref 0.0–0.1)
Immature Granulocytes: 0 %
Lymphocytes Absolute: 2.4 x10E3/uL (ref 0.7–3.1)
Lymphs: 32 %
MCH: 31.8 pg (ref 26.6–33.0)
MCHC: 33.6 g/dL (ref 31.5–35.7)
MCV: 95 fL (ref 79–97)
Monocytes Absolute: 0.6 x10E3/uL (ref 0.1–0.9)
Monocytes: 7 %
Neutrophils Absolute: 4.3 x10E3/uL (ref 1.4–7.0)
Neutrophils: 57 %
Platelets: 146 x10E3/uL — ABNORMAL LOW (ref 150–450)
RBC: 4.25 x10E6/uL (ref 3.77–5.28)
RDW: 13.1 % (ref 11.7–15.4)
WBC: 7.5 x10E3/uL (ref 3.4–10.8)

## 2024-04-02 LAB — HEMOGLOBIN A1C
Est. average glucose Bld gHb Est-mCnc: 214 mg/dL
Hgb A1c MFr Bld: 9.1 % — ABNORMAL HIGH (ref 4.8–5.6)

## 2024-04-02 LAB — MICROALBUMIN / CREATININE URINE RATIO
Creatinine, Urine: 35.2 mg/dL
Microalb/Creat Ratio: <9 mg/g{creat} (ref 0–29)
Microalbumin, Urine: <3 ug/mL

## 2024-04-02 LAB — LIPID PANEL
Chol/HDL Ratio: 3.6 ratio (ref 0.0–4.4)
Cholesterol, Total: 206 mg/dL — ABNORMAL HIGH (ref 100–199)
HDL: 57 mg/dL (ref >39)
LDL Chol Calc (NIH): 110 mg/dL — ABNORMAL HIGH (ref 0–99)
Triglycerides: 225 mg/dL — ABNORMAL HIGH (ref 0–149)
VLDL Cholesterol Cal: 39 mg/dL (ref 5–40)

## 2024-04-04 ENCOUNTER — Ambulatory Visit: Payer: Self-pay | Admitting: Physician Assistant

## 2024-04-04 ENCOUNTER — Other Ambulatory Visit: Payer: Self-pay | Admitting: Physician Assistant

## 2024-04-04 DIAGNOSIS — Z794 Long term (current) use of insulin: Secondary | ICD-10-CM

## 2024-04-04 MED ORDER — ROSUVASTATIN CALCIUM 20 MG PO TABS: 20.0000 mg | ORAL_TABLET | Freq: Every day | ORAL | 3 refills | Status: AC

## 2024-04-04 MED ORDER — BASAGLAR KWIKPEN 100 UNIT/ML ~~LOC~~ SOPN: 20.0000 [IU] | PEN_INJECTOR | Freq: Every day | SUBCUTANEOUS | 1 refills | Status: AC

## 2024-05-12 ENCOUNTER — Telehealth: Payer: Self-pay

## 2024-05-12 ENCOUNTER — Ambulatory Visit (INDEPENDENT_AMBULATORY_CARE_PROVIDER_SITE_OTHER)

## 2024-05-12 ENCOUNTER — Encounter: Payer: Self-pay | Admitting: Physician Assistant

## 2024-05-12 VITALS — Ht 66.5 in | Wt 235.0 lb

## 2024-05-12 DIAGNOSIS — Z0000 Encounter for general adult medical examination without abnormal findings: Secondary | ICD-10-CM

## 2024-05-12 NOTE — Progress Notes (Signed)
 Chief Complaint  Patient presents with   Medicare Wellness     Subjective:   Sheryl Henry is a 68 y.o. female who presents for a Medicare Annual Wellness Visit.  Allergies (verified) Ivp dye [iodinated contrast media]   History: Past Medical History:  Diagnosis Date   AL amyloidosis (HCC)    Cutaneous amyloidosis (HCC)    Diabetes mellitus without complication (HCC)    Diverticulosis    Heat intolerance    Hypertension    Hyperthyroidism    Neuropathy    Raynaud disease    Past Surgical History:  Procedure Laterality Date   BACK SURGERY     BREAST SURGERY     laproscopy     MANDIBLE SURGERY     Family History  Problem Relation Age of Onset   Breast cancer Mother    Colon cancer Father    Social History   Occupational History   Not on file  Tobacco Use   Smoking status: Never   Smokeless tobacco: Never  Vaping Use   Vaping status: Never Used  Substance and Sexual Activity   Alcohol use: Not Currently   Drug use: Never   Sexual activity: Not Currently   Tobacco Counseling Counseling given: Not Answered  SDOH Screenings   Food Insecurity: No Food Insecurity (05/12/2024)  Housing: Low Risk  (05/12/2024)  Transportation Needs: No Transportation Needs (05/12/2024)  Utilities: Not At Risk (05/12/2024)  Depression (PHQ2-9): Medium Risk (05/12/2024)  Physical Activity: Inactive (05/12/2024)  Social Connections: Moderately Isolated (05/12/2024)  Stress: No Stress Concern Present (05/12/2024)  Tobacco Use: Low Risk  (05/12/2024)  Health Literacy: Adequate Health Literacy (05/12/2024)   See flowsheets for full screening details  Depression Screen PHQ 2 & 9 Depression Scale- Over the past 2 weeks, how often have you been bothered by any of the following problems? Little interest or pleasure in doing things: 2 Feeling down, depressed, or hopeless (PHQ Adolescent also includes...irritable): 1 PHQ-2 Total Score: 3 Trouble falling or staying asleep, or  sleeping too much: 2 Feeling tired or having little energy: 3 Poor appetite or overeating (PHQ Adolescent also includes...weight loss): 1 Feeling bad about yourself - or that you are a failure or have let yourself or your family down: 0 Trouble concentrating on things, such as reading the newspaper or watching television (PHQ Adolescent also includes...like school work): 0 Moving or speaking so slowly that other people could have noticed. Or the opposite - being so fidgety or restless that you have been moving around a lot more than usual: 0 Thoughts that you would be better off dead, or of hurting yourself in some way: 0 PHQ-9 Total Score: 9 If you checked off any problems, how difficult have these problems made it for you to do your work, take care of things at home, or get along with other people?: Not difficult at all  Depression Treatment Depression Interventions/Treatment : Currently on Treatment; Medication     Goals Addressed             This Visit's Progress    Minimize chronic illness symptoms   On track      Visit info / Clinical Intake: Medicare Wellness Visit Type:: Initial Annual Wellness Visit Persons participating in visit:: patient Medicare Wellness Visit Mode:: Telephone If telephone:: video declined Because this visit was a virtual/telehealth visit:: vitals recorded from last visit If Telephone or Video please confirm:: I connected with the patient using audio enabled telemedicine application and verified that I  am speaking with the correct person using two identifiers; The patient expressed understanding and agreed to proceed; I discussed the limitations of evaluation and management by telemedicine Patient Location:: home Provider Location:: office Information given by:: patient Interpreter Needed?: No Pre-visit prep was completed: yes AWV questionnaire completed by patient prior to visit?: no Living arrangements:: (!) lives alone Patient's Overall Health  Status Rating: (!) fair Typical amount of pain: some Does pain affect daily life?: (!) yes Are you currently prescribed opioids?: (!) yes  Dietary Habits and Nutritional Risks How many meals a day?: 3 Eats fruit and vegetables daily?: yes Most meals are obtained by: having others provide food; preparing own meals Diabetic:: (!) yes Any non-healing wounds?: no How often do you check your BS?: as needed Would you like to be referred to a Nutritionist or for Diabetic Management? : no  Functional Status Activities of Daily Living (to include ambulation/medication): Independent Ambulation: Independent Medication Administration: Independent Home Management: Independent Manage your own finances?: yes Primary transportation is: family/friends Concerns about vision?: no *vision screening is required for WTM* Concerns about hearing?: no  Fall Screening Falls in the past year?: 0 Number of falls in past year: 0 Was there an injury with Fall?: 0 Fall Risk Category Calculator: 0 Patient Fall Risk Level: Low Fall Risk  Fall Risk Patient at Risk for Falls Due to: Impaired mobility Fall risk Follow up: Falls evaluation completed; Education provided; Falls prevention discussed  Home and Transportation Safety: All rugs have non-skid backing?: yes All stairs or steps have railings?: yes Grab bars in the bathtub or shower?: yes Have non-skid surface in bathtub or shower?: yes Good home lighting?: yes Regular seat belt use?: yes Hospital stays in the last year:: no  Cognitive Assessment Difficulty concentrating, remembering, or making decisions? : no Will 6CIT or Mini Cog be Completed: no 6CIT or Mini Cog Declined: patient alert, oriented, able to answer questions appropriately and recall recent events  Advance Directives (For Healthcare) Does Patient Have a Medical Advance Directive?: No Would patient like information on creating a medical advance directive?: Yes  (MAU/Ambulatory/Procedural Areas - Information given)  Reviewed/Updated  Reviewed/Updated: Reviewed All (Medical, Surgical, Family, Medications, Allergies, Care Teams, Patient Goals)        Objective:    Today's Vitals   05/12/24 1321  Weight: 235 lb (106.6 kg)  Height: 5' 6.5 (1.689 m)   Body mass index is 37.36 kg/m.  Current Medications (verified) Outpatient Encounter Medications as of 05/12/2024  Medication Sig   cetirizine (ZYRTEC) 10 MG tablet Take 1 tablet by mouth daily.   CINNAMON PO Take 1 capsule by mouth in the morning and at bedtime.   Coenzyme Q10 (COQ10 PO) Take 1 tablet by mouth daily.   CRANBERRY PO Take 1 tablet by mouth in the morning and at bedtime.   dicyclomine  (BENTYL ) 20 MG tablet Take 1 tablet (20 mg total) by mouth 4 (four) times daily.   gabapentin  (NEURONTIN ) 300 MG capsule Take 1 capsule (300 mg total) by mouth 3 (three) times daily.   hydrochlorothiazide  (MICROZIDE ) 12.5 MG capsule Take 1 capsule (12.5 mg total) by mouth daily.   Insulin  Glargine (BASAGLAR  KWIKPEN) 100 UNIT/ML Inject 20 Units into the skin daily.   Insulin  Pen Needle (PEN NEEDLES) 31G X 6 MM MISC 1 each by Does not apply route daily as needed.   levothyroxine  (SYNTHROID ) 50 MCG tablet Take 1 tablet (50 mcg total) by mouth daily.   metoprolol  tartrate (LOPRESSOR ) 25 MG tablet Take  0.5 tablets (12.5 mg total) by mouth 2 (two) times daily.   Multiple Vitamin (HEALTHY HAIR/SKIN/NAILS PO) Take by mouth.   Multiple Vitamin (MULTIVITAMIN) tablet Take 1 tablet by mouth daily.   Multiple Vitamins-Minerals (EYE SUPPORT PO) Take by mouth.   Multiple Vitamins-Minerals (IMMUNE SUPPORT PO) Take by mouth.   naproxen sodium (ALEVE) 220 MG tablet Take 220 mg by mouth daily as needed (PAIN).   NON FORMULARY Circulation and Vein support MVI            Hesperidin  100 mg Vitamin C 30 mg                                     Rutin  20 mg Flavonoids 720 mg                                 Grape seed  extract   40 mg Diosmin  600 mg                                    Pine Bark Extract   40 mg Take 1 capsule by mouth every morning Butcher Broom extract 40 mg   Omega-3 1000 MG CAPS Take 1 capsule by mouth in the morning and at bedtime.   oxyCODONE -acetaminophen  (PERCOCET/ROXICET) 5-325 MG tablet Take 1 tablet by mouth 4 (four) times daily as needed for up to 4 doses for moderate pain.   perindopril  (ACEON ) 8 MG tablet Take 1 tablet (8 mg total) by mouth daily.   rosuvastatin  (CRESTOR ) 20 MG tablet Take 1 tablet (20 mg total) by mouth daily.   UNABLE TO FIND Med Name: brain and memory support   venlafaxine  XR (EFFEXOR  XR) 75 MG 24 hr capsule Take 1 capsule (75 mg total) by mouth daily with breakfast.   venlafaxine  XR (EFFEXOR -XR) 37.5 MG 24 hr capsule Take 1 capsule (37.5 mg total) by mouth daily.   No facility-administered encounter medications on file as of 05/12/2024.   Hearing/Vision screen Hearing Screening - Comments:: Patient is able to hear conversational tones without difficulty. No issues reported.   Vision Screening - Comments:: No vision problems; will schedule routine eye exam  Immunizations and Health Maintenance Health Maintenance  Topic Date Due   OPHTHALMOLOGY EXAM  Never done   Hepatitis C Screening  Never done   Mammogram  Never done   Colonoscopy  Never done   Bone Density Scan  Never done   COVID-19 Vaccine (1 - 2025-26 season) Never done   Zoster Vaccines- Shingrix (1 of 2) 07/01/2024 (Originally 11/03/2006)   Influenza Vaccine  09/19/2024 (Originally 01/21/2024)   DTaP/Tdap/Td (1 - Tdap) 09/29/2024 (Originally 11/03/1975)   Pneumococcal Vaccine: 50+ Years (1 of 2 - PCV) 03/31/2025 (Originally 11/03/1975)   HEMOGLOBIN A1C  09/29/2024   Diabetic kidney evaluation - eGFR measurement  03/31/2025   Diabetic kidney evaluation - Urine ACR  03/31/2025   FOOT EXAM  03/31/2025   Medicare Annual Wellness (AWV)  05/12/2025   Meningococcal B Vaccine  Aged Out         Assessment/Plan:  This is a routine wellness examination for Sheryl Henry.  Patient Care Team: Grooms, Charmaine, [place] as PCP - General (Physician Assistant) Daune Eleanor HERO, FNP as Nurse Practitioner (Pain Medicine) Arman Dorn Loving,  MD as Referring Physician (Hematology and Oncology)  I have personally reviewed and noted the following in the patient's chart:   Medical and social history Use of alcohol, tobacco or illicit drugs  Current medications and supplements including opioid prescriptions. Functional ability and status Nutritional status Physical activity Advanced directives List of other physicians Hospitalizations, surgeries, and ER visits in previous 12 months Vitals Screenings to include cognitive, depression, and falls Referrals and appointments  No orders of the defined types were placed in this encounter.  In addition, I have reviewed and discussed with patient certain preventive protocols, quality metrics, and best practice recommendations. A written personalized care plan for preventive services as well as general preventive health recommendations were provided to patient.   Lavelle Charmaine Browner, LPN   [id number]/[id number]   Return in 1 year (on 05/12/2025).  After Visit Summary: (MyChart) Due to this being a telephonic visit, the after visit summary with patients personalized plan was offered to patient via MyChart   Nurse Notes: Patient would like to hold on preventive screenings at this time; aware that order have been placed and may be completed in the future if she wishes. See telephone note with patient concerns.

## 2024-05-12 NOTE — Patient Instructions (Signed)
 Ms. Blakesley,  Thank you for taking the time for your Medicare Wellness Visit. I appreciate your continued commitment to your health goals. Please review the care plan we discussed, and feel free to reach out if I can assist you further.  Please note that Annual Wellness Visits do not include a physical exam. Some assessments may be limited, especially if the visit was conducted virtually. If needed, we may recommend an in-person follow-up with your provider.  Ongoing Care Seeing your primary care provider every 3 to 6 months helps us  monitor your health and provide consistent, personalized care.   Referrals If a referral was made during today's visit and you haven't received any updates within two weeks, please contact the referred provider directly to check on the status.  Recommended Screenings:  Health Maintenance  Topic Date Due   Eye exam for diabetics  Never done   Hepatitis C Screening  Never done   Breast Cancer Screening  Never done   Colon Cancer Screening  Never done   Osteoporosis screening with Bone Density Scan  Never done   COVID-19 Vaccine (1 - 2025-26 season) Never done   Zoster (Shingles) Vaccine (1 of 2) 07/01/2024*   Flu Shot  09/19/2024*   DTaP/Tdap/Td vaccine (1 - Tdap) 09/29/2024*   Pneumococcal Vaccine for age over 52 (1 of 2 - PCV) 03/31/2025*   Hemoglobin A1C  09/29/2024   Yearly kidney function blood test for diabetes  03/31/2025   Yearly kidney health urinalysis for diabetes  03/31/2025   Complete foot exam   03/31/2025   Medicare Annual Wellness Visit  05/12/2025   Meningitis B Vaccine  Aged Out  *Topic was postponed. The date shown is not the original due date.       05/12/2024    1:39 PM  Advanced Directives  Does Patient Have a Medical Advance Directive? No  Would patient like information on creating a medical advance directive? Yes (MAU/Ambulatory/Procedural Areas - Information given)   Information on Advanced Care Planning can be found at  [place]  Secretary of [facility] Advance Health Care Directives Advance Health Care Directives ([email or web address]   Vision: Annual vision screenings are recommended for early detection of glaucoma, cataracts, and diabetic retinopathy. These exams can also reveal signs of chronic conditions such as diabetes and high blood pressure.  Dental: Annual dental screenings help detect early signs of oral cancer, gum disease, and other conditions linked to overall health, including heart disease and diabetes.  Please see the attached documents for additional preventive care recommendations.

## 2024-05-12 NOTE — Telephone Encounter (Signed)
 Patient seen for AWV and is asking about status of referral to orthopedic doctor for bilateral hand pain.  Also states that her currently pain management doctor has moved to [place] which is too far for her to drive; she's asking for a referral to a provider that is closer.

## 2024-05-26 ENCOUNTER — Ambulatory Visit

## 2024-06-28 ENCOUNTER — Other Ambulatory Visit: Payer: Self-pay | Admitting: Physician Assistant

## 2024-06-28 DIAGNOSIS — G5603 Carpal tunnel syndrome, bilateral upper limbs: Secondary | ICD-10-CM

## 2024-07-02 NOTE — Progress Notes (Unsigned)
" ° °  Sheryl Henry - 69 y.o. female MRN [id number]  Date of birth: Dec 13, 1955  Office Visit Note: Visit Date: 07/03/2024 PCP: Mancil Pfeiffer, PA-C Referred by: Grooms, Courtney, PA-C  Subjective: No chief complaint on file.  HPI: Sheryl Henry is a pleasant 69 y.o. female who presents today for ***  Pertinent ROS were reviewed with the patient and found to be negative unless otherwise specified above in HPI.   Visit Reason: Duration of symptoms: Hand dominance: {RIGHT/LEFT:20294} Occupation: Diabetic: {yes/no:20286} Smoking: {yes/no:20286} Heart/Lung History: Blood Thinners:   Prior Testing/EMG: Injections (Date): Treatments: Prior Surgery:    Assessment & Plan: Visit Diagnoses: No diagnosis found.  Plan: ***  Follow-up: No follow-ups on file.   Meds & Orders: No orders of the defined types were placed in this encounter.  No orders of the defined types were placed in this encounter.    Procedures: No procedures performed      Clinical History: No specialty comments available.  She reports that she has never smoked. She has never used smokeless tobacco.  Recent Labs    10/01/23 1424 03/31/24 1125  HGBA1C 8.2* 9.1*    Objective:   Vital Signs: There were no vitals taken for this visit.  Physical Exam  Gen: Well-appearing, in no acute distress; non-toxic CV: Regular Rate. Well-perfused. Warm.  Resp: Breathing unlabored on room air; no wheezing. Psych: Fluid speech in conversation; appropriate affect; normal thought process  Ortho Exam - ***   Imaging: No results found.  Past Medical/Family/Surgical/Social History: Medications & Allergies reviewed per EMR, new medications updated. Patient Active Problem List   Diagnosis Date Noted   Bilateral carpal tunnel syndrome 03/31/2024   Chronic pain syndrome 09/30/2023   Diverticulosis of large intestine without hemorrhage 09/30/2023   Gastroesophageal reflux disease 09/30/2023   DDD (degenerative  disc disease), lumbar 09/30/2023   Hypothyroidism 09/30/2023   Depression 08/28/2021   Paranoid personality disorder (HCC)    Auditory hallucination    Diabetes mellitus, type 2 (HCC) 05/02/2021   Hypertension 05/02/2021   Migraine headache with aura 05/02/2021   Cutaneous amyloidosis (HCC)    Type 2 diabetes mellitus with hyperglycemia, with long-term current use of insulin  (HCC) 02/11/2017   Past Medical History:  Diagnosis Date   AL amyloidosis (HCC)    Cutaneous amyloidosis (HCC)    Diabetes mellitus without complication (HCC)    Diverticulosis    Heat intolerance    Hypertension    Hyperthyroidism    Neuropathy    Raynaud disease    Family History  Problem Relation Age of Onset   Breast cancer Mother    Colon cancer Father    Past Surgical History:  Procedure Laterality Date   BACK SURGERY     BREAST SURGERY     laproscopy     MANDIBLE SURGERY     Social History   Occupational History   Not on file  Tobacco Use   Smoking status: Never   Smokeless tobacco: Never  Vaping Use   Vaping status: Never Used  Substance and Sexual Activity   Alcohol use: Not Currently   Drug use: Never   Sexual activity: Not Currently    Anshul Estela) [place], M.D. [facility], Hand Surgery  "

## 2024-07-03 ENCOUNTER — Ambulatory Visit: Admitting: Orthopedic Surgery

## 2024-09-04 ENCOUNTER — Ambulatory Visit: Admitting: Orthopedic Surgery

## 2024-09-29 ENCOUNTER — Ambulatory Visit: Admitting: Physician Assistant
